# Patient Record
Sex: Female | Born: 1970 | Race: White | Hispanic: No | Smoking: Former smoker
Health system: Southern US, Community
[De-identification: ages and names within clinical notes are randomized; demographics above are authoritative.]

## PROBLEM LIST (undated history)

## (undated) DIAGNOSIS — F1910 Other psychoactive substance abuse, uncomplicated: Secondary | ICD-10-CM

## (undated) DIAGNOSIS — R519 Headache, unspecified: Secondary | ICD-10-CM

## (undated) DIAGNOSIS — T7840XA Allergy, unspecified, initial encounter: Secondary | ICD-10-CM

## (undated) DIAGNOSIS — F32A Depression, unspecified: Secondary | ICD-10-CM

## (undated) DIAGNOSIS — F319 Bipolar disorder, unspecified: Secondary | ICD-10-CM

## (undated) DIAGNOSIS — F209 Schizophrenia, unspecified: Secondary | ICD-10-CM

## (undated) DIAGNOSIS — K219 Gastro-esophageal reflux disease without esophagitis: Secondary | ICD-10-CM

## (undated) DIAGNOSIS — M1990 Unspecified osteoarthritis, unspecified site: Secondary | ICD-10-CM

## (undated) DIAGNOSIS — F329 Major depressive disorder, single episode, unspecified: Secondary | ICD-10-CM

## (undated) DIAGNOSIS — F419 Anxiety disorder, unspecified: Secondary | ICD-10-CM

## (undated) DIAGNOSIS — F609 Personality disorder, unspecified: Secondary | ICD-10-CM

## (undated) DIAGNOSIS — R51 Headache: Secondary | ICD-10-CM

## (undated) HISTORY — PX: OTHER SURGICAL HISTORY: SHX169

## (undated) HISTORY — DX: Gastro-esophageal reflux disease without esophagitis: K21.9

## (undated) HISTORY — PX: KNEE ARTHROSCOPY WITH PATELLA RECONSTRUCTION: SHX5654

## (undated) HISTORY — DX: Allergy, unspecified, initial encounter: T78.40XA

## (undated) HISTORY — DX: Anxiety disorder, unspecified: F41.9

---

## 2008-09-24 ENCOUNTER — Emergency Department: Payer: Self-pay | Admitting: Emergency Medicine

## 2012-10-24 LAB — DRUG SCREEN, URINE
Amphetamines, Ur Screen: NEGATIVE (ref ?–1000)
Barbiturates, Ur Screen: NEGATIVE (ref ?–200)
Benzodiazepine, Ur Scrn: NEGATIVE (ref ?–200)
Cannabinoid 50 Ng, Ur ~~LOC~~: POSITIVE (ref ?–50)
MDMA (Ecstasy)Ur Screen: NEGATIVE (ref ?–500)
Opiate, Ur Screen: NEGATIVE (ref ?–300)
Phencyclidine (PCP) Ur S: NEGATIVE (ref ?–25)
Tricyclic, Ur Screen: NEGATIVE (ref ?–1000)

## 2012-10-24 LAB — URINALYSIS, COMPLETE
Glucose,UR: NEGATIVE mg/dL (ref 0–75)
Nitrite: NEGATIVE
Protein: NEGATIVE
RBC,UR: <1 /HPF (ref 0–5)
Squamous Epithelial: 11
WBC UR: 4 /HPF (ref 0–5)

## 2012-10-24 LAB — CBC
HCT: 39.6 % (ref 35.0–47.0)
HGB: 13.4 g/dL (ref 12.0–16.0)
Platelet: 267 10*3/uL (ref 150–440)
RBC: 4.16 10*6/uL (ref 3.80–5.20)
WBC: 10.2 10*3/uL (ref 3.6–11.0)

## 2012-10-24 LAB — COMPREHENSIVE METABOLIC PANEL
Albumin: 4.1 g/dL (ref 3.4–5.0)
Alkaline Phosphatase: 97 U/L (ref 50–136)
Anion Gap: 7 (ref 7–16)
Bilirubin,Total: 0.4 mg/dL (ref 0.2–1.0)
Calcium, Total: 8.6 mg/dL (ref 8.5–10.1)
Co2: 24 mmol/L (ref 21–32)
EGFR (African American): >60
Glucose: 87 mg/dL (ref 65–99)
Osmolality: 275 (ref 275–301)
SGOT(AST): 20 U/L (ref 15–37)
SGPT (ALT): 17 U/L (ref 12–78)
Total Protein: 7.5 g/dL (ref 6.4–8.2)

## 2012-10-24 LAB — TSH: Thyroid Stimulating Horm: 1.18 u[IU]/mL

## 2012-10-25 ENCOUNTER — Inpatient Hospital Stay: Payer: Self-pay | Admitting: Psychiatry

## 2012-12-02 ENCOUNTER — Emergency Department: Payer: Self-pay | Admitting: Emergency Medicine

## 2013-10-11 ENCOUNTER — Emergency Department: Payer: Self-pay | Admitting: Internal Medicine

## 2014-01-18 ENCOUNTER — Emergency Department: Payer: Self-pay | Admitting: Emergency Medicine

## 2014-02-04 ENCOUNTER — Emergency Department: Payer: Self-pay | Admitting: Emergency Medicine

## 2014-03-07 ENCOUNTER — Emergency Department: Payer: Self-pay | Admitting: Emergency Medicine

## 2014-03-07 LAB — CBC WITH DIFFERENTIAL/PLATELET
BASOS PCT: 1.4 %
Basophil #: 0.1 10*3/uL (ref 0.0–0.1)
Eosinophil #: 0.2 10*3/uL (ref 0.0–0.7)
Eosinophil %: 1.9 %
HCT: 41.6 % (ref 35.0–47.0)
HGB: 13.8 g/dL (ref 12.0–16.0)
LYMPHS ABS: 3.5 10*3/uL (ref 1.0–3.6)
Lymphocyte %: 39.6 %
MCH: 31.3 pg (ref 26.0–34.0)
MCHC: 33.3 g/dL (ref 32.0–36.0)
MCV: 94 fL (ref 80–100)
MONO ABS: 0.5 x10 3/mm (ref 0.2–0.9)
Monocyte %: 5.9 %
NEUTROS PCT: 51.2 %
Neutrophil #: 4.5 10*3/uL (ref 1.4–6.5)
Platelet: 270 10*3/uL (ref 150–440)
RBC: 4.42 10*6/uL (ref 3.80–5.20)
RDW: 14.1 % (ref 11.5–14.5)
WBC: 8.8 10*3/uL (ref 3.6–11.0)

## 2014-03-07 LAB — URINALYSIS, COMPLETE
Bacteria: NONE SEEN
Bilirubin,UR: NEGATIVE
Glucose,UR: NEGATIVE mg/dL (ref 0–75)
Ketone: NEGATIVE
LEUKOCYTE ESTERASE: NEGATIVE
NITRITE: NEGATIVE
PH: 5 (ref 4.5–8.0)
Protein: NEGATIVE
RBC,UR: <1 /HPF (ref 0–5)
SPECIFIC GRAVITY: 1.004 (ref 1.003–1.030)
Squamous Epithelial: 1
WBC UR: <1 /HPF (ref 0–5)

## 2014-03-07 LAB — DRUG SCREEN, URINE
AMPHETAMINES, UR SCREEN: NEGATIVE (ref ?–1000)
BARBITURATES, UR SCREEN: NEGATIVE (ref ?–200)
BENZODIAZEPINE, UR SCRN: NEGATIVE (ref ?–200)
CANNABINOID 50 NG, UR ~~LOC~~: NEGATIVE (ref ?–50)
COCAINE METABOLITE, UR ~~LOC~~: POSITIVE (ref ?–300)
MDMA (ECSTASY) UR SCREEN: NEGATIVE (ref ?–500)
Methadone, Ur Screen: NEGATIVE (ref ?–300)
Opiate, Ur Screen: NEGATIVE (ref ?–300)
Phencyclidine (PCP) Ur S: NEGATIVE (ref ?–25)
Tricyclic, Ur Screen: NEGATIVE (ref ?–1000)

## 2014-03-07 LAB — COMPREHENSIVE METABOLIC PANEL
ALK PHOS: 94 U/L
ALT: 13 U/L (ref 12–78)
ANION GAP: 9 (ref 7–16)
Albumin: 3.7 g/dL (ref 3.4–5.0)
BILIRUBIN TOTAL: 0.2 mg/dL (ref 0.2–1.0)
BUN: 7 mg/dL (ref 7–18)
CHLORIDE: 110 mmol/L — AB (ref 98–107)
Calcium, Total: 8.2 mg/dL — ABNORMAL LOW (ref 8.5–10.1)
Co2: 22 mmol/L (ref 21–32)
Creatinine: 1.04 mg/dL (ref 0.60–1.30)
EGFR (African American): >60
GLUCOSE: 103 mg/dL — AB (ref 65–99)
OSMOLALITY: 279 (ref 275–301)
POTASSIUM: 3.5 mmol/L (ref 3.5–5.1)
SGOT(AST): 21 U/L (ref 15–37)
SODIUM: 141 mmol/L (ref 136–145)
Total Protein: 7.4 g/dL (ref 6.4–8.2)

## 2014-03-07 LAB — ETHANOL
Ethanol %: 0.126 % — ABNORMAL HIGH (ref 0.000–0.080)
Ethanol: 126 mg/dL

## 2014-03-07 LAB — ACETAMINOPHEN LEVEL
Acetaminophen: <2 ug/mL
Acetaminophen: <2 ug/mL

## 2014-03-07 LAB — SALICYLATE LEVEL: Salicylates, Serum: 3 mg/dL — ABNORMAL HIGH

## 2014-03-07 LAB — PREGNANCY, URINE: PREGNANCY TEST, URINE: NEGATIVE m[IU]/mL

## 2014-03-19 ENCOUNTER — Emergency Department: Payer: Self-pay | Admitting: Emergency Medicine

## 2014-06-28 ENCOUNTER — Emergency Department: Payer: Self-pay | Admitting: Emergency Medicine

## 2014-12-26 ENCOUNTER — Emergency Department (HOSPITAL_COMMUNITY): Payer: Medicare Other

## 2014-12-26 ENCOUNTER — Emergency Department (HOSPITAL_COMMUNITY)
Admission: EM | Admit: 2014-12-26 | Discharge: 2014-12-27 | Disposition: A | Discharge status: Home / Self Care | Payer: Medicare Other | Attending: Emergency Medicine | Admitting: Emergency Medicine

## 2014-12-26 ENCOUNTER — Encounter (HOSPITAL_COMMUNITY): Payer: Self-pay

## 2014-12-26 DIAGNOSIS — S0990XA Unspecified injury of head, initial encounter: Secondary | ICD-10-CM | POA: Insufficient documentation

## 2014-12-26 DIAGNOSIS — T148XXA Other injury of unspecified body region, initial encounter: Secondary | ICD-10-CM

## 2014-12-26 DIAGNOSIS — Y92410 Unspecified street and highway as the place of occurrence of the external cause: Secondary | ICD-10-CM | POA: Insufficient documentation

## 2014-12-26 DIAGNOSIS — S24109A Unspecified injury at unspecified level of thoracic spinal cord, initial encounter: Secondary | ICD-10-CM | POA: Insufficient documentation

## 2014-12-26 DIAGNOSIS — S3992XA Unspecified injury of lower back, initial encounter: Secondary | ICD-10-CM | POA: Insufficient documentation

## 2014-12-26 DIAGNOSIS — F419 Anxiety disorder, unspecified: Secondary | ICD-10-CM | POA: Diagnosis not present

## 2014-12-26 DIAGNOSIS — R0602 Shortness of breath: Secondary | ICD-10-CM | POA: Insufficient documentation

## 2014-12-26 DIAGNOSIS — Y9389 Activity, other specified: Secondary | ICD-10-CM | POA: Diagnosis not present

## 2014-12-26 DIAGNOSIS — T148 Other injury of unspecified body region: Secondary | ICD-10-CM | POA: Diagnosis not present

## 2014-12-26 DIAGNOSIS — Y998 Other external cause status: Secondary | ICD-10-CM | POA: Insufficient documentation

## 2014-12-26 DIAGNOSIS — S199XXA Unspecified injury of neck, initial encounter: Secondary | ICD-10-CM | POA: Diagnosis not present

## 2014-12-26 DIAGNOSIS — Z720 Tobacco use: Secondary | ICD-10-CM | POA: Insufficient documentation

## 2014-12-26 DIAGNOSIS — T07XXXA Unspecified multiple injuries, initial encounter: Secondary | ICD-10-CM

## 2014-12-26 MED ORDER — HYDROMORPHONE HCL 1 MG/ML IJ SOLN
1.0000 mg | Freq: Once | INTRAMUSCULAR | Status: AC
  Administered 2014-12-26: 1 mg via INTRAVENOUS
  Filled 2014-12-26: qty 1

## 2014-12-26 MED ORDER — SODIUM CHLORIDE 0.9 % IV SOLN
INTRAVENOUS | Status: DC
  Administered 2014-12-26: 18:00:00 via INTRAVENOUS

## 2014-12-26 MED ORDER — LORAZEPAM 2 MG/ML IJ SOLN
1.0000 mg | Freq: Once | INTRAMUSCULAR | Status: AC
  Administered 2014-12-26: 1 mg via INTRAVENOUS
  Filled 2014-12-26: qty 1

## 2014-12-26 MED ORDER — ONDANSETRON HCL 4 MG/2ML IJ SOLN
4.0000 mg | Freq: Once | INTRAMUSCULAR | Status: AC
  Administered 2014-12-26: 4 mg via INTRAVENOUS
  Filled 2014-12-26: qty 2

## 2014-12-26 MED ORDER — TRAMADOL HCL 50 MG PO TABS: 50.0000 mg | ORAL_TABLET | Freq: Four times a day (QID) | ORAL | Status: DC | PRN
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
## 2015-01-07 NOTE — Discharge Summary (Signed)
PATIENT NAME:  Augustin CoupeRUDD, Reece R MR#:  [id number] DATE OF BIRTH:  07-Sep-1971  DATE OF ADMISSION:  10/25/2012 DATE OF DISCHARGE:  10/27/2012  HOSPITAL COURSE: See dictated history and physical for details of admission. This 44 year old woman came to the Emergency Room reporting suicidal ideation and hallucinations with anxiety and depression. She indicated that she had been feeling out of control, but also that she had relapsed onto cocaine in the last day, which seems to have triggered her hospitalization. In the hospital, she participated appropriately in groups. Did not display any suicidal behavior. Did not appear to be thinking bizarrely. On interview yesterday and today, her affect was calm and reactive. Thoughts appeared to be lucid. Denied auditory or visual hallucinations. Denied suicidal or homicidal ideation. Showed insight and judgment and was agreeable to outpatient treatment. She had been restarted on her medication combination of Prozac, Abilify and Ativan that she had been taking outside the hospital. She was cooperative with treatment planning, and at this point, does not appear to be requiring further hospital level of treatment. She was agreeable to follow up with [facility], where she is already seen.   DISCHARGE MEDICATIONS: Abilify 15 mg p.o. daily, Prozac 40 mg p.o. daily, Synthroid 75 mcg p.o. daily, Ativan 1 mg 3 times a night, which she can take at home as directed by her outpatient clinic.   LABORATORY RESULTS: The CBC was unremarkable. Chemistry panel just showed a slightly low BUN at 6, elevated chloride at 108. Alcohol negative. TSH 1.18. Urinalysis just showed blood. Drug screen positive for cocaine and cannabis. Pregnancy test negative.   MENTAL STATUS EXAM: Neatly dressed and groomed woman who looked her stated age. Cooperative with the interview. Good eye contact, normal psychomotor activity. Speech normal in rate, tone and volume. Affect euthymic, reactive and appropriate. Mood  stated as being okay. Thoughts are lucid without loosening of associations or evidence of delusions. Denies auditory or visual hallucinations. Denies suicidal or homicidal ideation. Improved judgment and insight. Baseline intelligence normal.   DISPOSITION: The patient was counseled about the importance of staying off of abusable drugs because of the effect they have worsening mental health problems. She was agreeable to this and states that she will be following up with [facility].   DIAGNOSIS, PRINCIPAL AND PRIMARY:  AXIS I: Bipolar disorder type II.   SECONDARY DIAGNOSES:  AXIS I:  1. Cocaine abuse.  2. Cannabis abuse.  AXIS II: Deferred.  AXIS III: Hypothyroidism.  AXIS IV: Moderate from ongoing social stress at home and exposure to substance abuse.  AXIS V: Functioning at time of discharge: 60.    ____________________________ Audery AmelJohn T. Kaito Schulenburg, MD jtc:OSi D: 10/27/2012 18:17:50 ET T: 10/28/2012 06:07:50 ET JOB#: [id number]  cc: Audery AmelJohn T. Saxon Crosby, MD, <Dictator> Audery AmelJOHN T Caliana Spires MD ELECTRONICALLY SIGNED 10/28/2012 9:49
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
## 2015-01-08 NOTE — Consult Note (Signed)
PATIENT NAME:  Mercedes Forbes, Mercedes Forbes MR#:  [id number] DATE OF BIRTH:  [birth year]  DATE OF CONSULTATION:  03/07/2014  REFERRING PHYSICIAN:   CONSULTING PHYSICIAN:  Surya K. Guss Bundehalla, MD  PLACE OF DICTATION:  [facility] - BHU [address], [place].  AGE:  44 years.  SEX:  Female.  RACE:  White.  HISTORY OF PRESENT ILLNESS:  The patient was seen in consultation in room #5, BHU, in the [facility] ER. The patient is a 44 year old white female, not employed and is on disability for bipolar disorder, depressed.  The patient lost her husband 6 months ago after being married for 6 months, and he committed suicide and shot himself.  The patient lives with her cat and dog, and her boyfriend visits her occasionally.  The patient has a long history of mental illness and is being followed on an outpatient basis by Dr. Marguerite [facility]Moffett at [facility], and last appointment was a week ago; next appointment is in September 2015.    ALCOHOL AND DRUGS:  Admits that she does not drink alcohol on a regular basis, but she drinks sometimes in binges.  On the day of admission, she was drinking alcohol and got drunk and intoxicated, and boyfriend came to visit and got worried and called ambulance and brought her here.  Denies any other street or prescription drug abuse, does admit smoking nicotine cigarettes at a rate of half a pack a day for many years.    PAST PSYCHIATRIC HISTORY:  History of inpatient psychiatry 4 times so far and last inpatient psychiatry was in July of 2014 at [facility] for [facility].  History of suicide attempt 4 times when she overdosed on pills.    MENTAL STATUS:  The patient is alert and oriented to place, person and time, fully aware of the situation that brought her here.  She realizes that she got depressed and self-medicated herself with alcohol, which she should not.  Denies feeling depressed .  Denies feeling hopeless or helpless.  Denies feeling worthless or useless.  Denies suicidal or homicidal plans and  contracts for safety.  No psychosis.  Denies auditory or visual hallucinations,.  Cognition is grossly intact.  General knowledge is fair.  Insight and judgment guarded.  Impulse control guarded.    IMPRESSION:  Bipolar disorder, depressed, history of.  Currently, the patient reports she is stable. Imp, alcohol induced with intoxication and BAL of 126 at the time she came to the hospital.  Urine drug screen positive for cocaine though the patient denies the same.    RECOMMENDATIONS:  I recommend DC IVC and discharge the patient back home as she contracts for safety and is eager to go back and keep up her followup appointment with Dr. Marguerite OleaMoffett.    ____________________________ Jannet MantisSurya K. Guss Bundehalla, MD skc:cs D: 03/07/2014 19:13:00 ET T: 03/07/2014 19:32:43 ET JOB#: [id number]  cc: Monika SalkSurya K. Guss Bundehalla, MD, <Dictator> Beau FannySURYA K CHALLA MD ELECTRONICALLY SIGNED 03/08/2014 9:28

## 2015-11-24 ENCOUNTER — Encounter: Payer: Self-pay | Admitting: Emergency Medicine

## 2015-11-24 ENCOUNTER — Emergency Department
Admission: EM | Admit: 2015-11-24 | Discharge: 2015-11-24 | Disposition: A | Discharge status: Home / Self Care | Payer: Medicare Other | Attending: Emergency Medicine | Admitting: Emergency Medicine

## 2015-11-24 DIAGNOSIS — R600 Localized edema: Secondary | ICD-10-CM | POA: Diagnosis not present

## 2015-11-24 DIAGNOSIS — Z79899 Other long term (current) drug therapy: Secondary | ICD-10-CM | POA: Diagnosis not present

## 2015-11-24 DIAGNOSIS — R609 Edema, unspecified: Secondary | ICD-10-CM

## 2015-11-24 DIAGNOSIS — F17200 Nicotine dependence, unspecified, uncomplicated: Secondary | ICD-10-CM | POA: Diagnosis not present

## 2015-11-24 DIAGNOSIS — R2230 Localized swelling, mass and lump, unspecified upper limb: Secondary | ICD-10-CM | POA: Diagnosis present

## 2015-11-24 HISTORY — DX: Personality disorder, unspecified: F60.9

## 2015-11-24 HISTORY — DX: Unspecified osteoarthritis, unspecified site: M19.90

## 2015-11-24 HISTORY — DX: Schizophrenia, unspecified: F20.9

## 2015-11-24 HISTORY — DX: Bipolar disorder, unspecified: F31.9

## 2015-11-24 LAB — CBC WITH DIFFERENTIAL/PLATELET
BASOS ABS: 0.1 10*3/uL (ref 0–0.1)
BASOS PCT: 2 %
EOS ABS: 0.1 10*3/uL (ref 0–0.7)
Eosinophils Relative: 2 %
HCT: 34.5 % — ABNORMAL LOW (ref 35.0–47.0)
Hemoglobin: 11.6 g/dL — ABNORMAL LOW (ref 12.0–16.0)
LYMPHS ABS: 1.3 10*3/uL (ref 1.0–3.6)
LYMPHS PCT: 20 %
MCH: 31.1 pg (ref 26.0–34.0)
MCHC: 33.6 g/dL (ref 32.0–36.0)
MCV: 92.6 fL (ref 80.0–100.0)
MONO ABS: 0.4 10*3/uL (ref 0.2–0.9)
MONOS PCT: 6 %
NEUTROS ABS: 4.6 10*3/uL (ref 1.4–6.5)
NEUTROS PCT: 70 %
PLATELETS: 277 10*3/uL (ref 150–440)
RBC: 3.72 MIL/uL — AB (ref 3.80–5.20)
RDW: 13.8 % (ref 11.5–14.5)
WBC: 6.4 10*3/uL (ref 3.6–11.0)

## 2015-11-24 LAB — COMPREHENSIVE METABOLIC PANEL
ALBUMIN: 3.3 g/dL — AB (ref 3.5–5.0)
ALT: 15 U/L (ref 14–54)
ANION GAP: 4 — AB (ref 5–15)
AST: 25 U/L (ref 15–41)
Alkaline Phosphatase: 59 U/L (ref 38–126)
BUN: 9 mg/dL (ref 6–20)
CALCIUM: 8.5 mg/dL — AB (ref 8.9–10.3)
CHLORIDE: 107 mmol/L (ref 101–111)
CO2: 26 mmol/L (ref 22–32)
Creatinine, Ser: 1.01 mg/dL — ABNORMAL HIGH (ref 0.44–1.00)
GFR calc non Af Amer: >60 mL/min (ref >60)
GLUCOSE: 95 mg/dL (ref 65–99)
POTASSIUM: 4.3 mmol/L (ref 3.5–5.1)
SODIUM: 137 mmol/L (ref 135–145)
Total Bilirubin: 0.5 mg/dL (ref 0.3–1.2)
Total Protein: 6.3 g/dL — ABNORMAL LOW (ref 6.5–8.1)

## 2015-11-24 LAB — CK: CK TOTAL: 291 U/L — AB (ref 38–234)

## 2015-11-24 LAB — SEDIMENTATION RATE: Sed Rate: 23 mm/hr — ABNORMAL HIGH (ref 0–20)

## 2015-11-24 MED ORDER — HYDROCHLOROTHIAZIDE 12.5 MG PO TABS: 12.5000 mg | ORAL_TABLET | Freq: Every day | ORAL | Status: DC

## 2015-11-24 NOTE — ED Notes (Signed)
Hands, knees and ankles pain and feels swollen x 3 days

## 2015-11-24 NOTE — Discharge Instructions (Signed)

## 2015-11-24 NOTE — ED Provider Notes (Signed)
[facility] Emergency Department Provider Note  ____________________________________________  Time seen: Approximately 12:29 PM  I have reviewed the triage vital signs and the nursing notes.   HISTORY  Chief Complaint Joint Swelling    HPI Mercedes Forbes is a 45 y.o. female , NAD, presents to the emergency department with swelling about the hands, ankles, feet for about 3 days, causing pain. Does have history of degenerative arthritis but no history of rheumatoid or other forms of arthritis. Has not had any falls, traumas or other injuries. Denies chest pain, shortness of breath, numbness, weakness, tingling. Denies any redness, warmth, skin sores. No recent changes in medication. Was taken off Latuda and put on Abilify in November 2016. No other changes made to her medical regimen since that time. She does have a psychiatrist but does not have primary care at this time.   Past Medical History  Diagnosis Date  . Arthritis   . Bipolar 1 disorder (HCC)   . Schizophrenia (HCC)   . Personality disorder     There are no active problems to display for this patient.   Past Surgical History  Procedure Laterality Date  . Joint replacement      Current Outpatient Rx  Name  Route  Sig  Dispense  Refill  . clonazePAM (KLONOPIN) 1 MG tablet   Oral   Take 1 mg by mouth 2 (two) times daily.      3   . cloNIDine (CATAPRES) 0.1 MG tablet   Oral   Take 0.1 mg by mouth at bedtime.      3   . FLUoxetine (PROZAC) 40 MG capsule   Oral   Take 40 mg by mouth daily.      3   . hydrochlorothiazide (HYDRODIURIL) 12.5 MG tablet   Oral   Take 1 tablet (12.5 mg total) by mouth daily.   14 tablet   0   . LATUDA 80 MG TABS tablet   Oral   Take 80 mg by mouth daily.      3     Dispense as written.   . Multiple Vitamins-Minerals (MULTIVITAMIN ADULT PO)   Oral   Take 1 tablet by mouth daily.         . traMADol (ULTRAM) 50 MG tablet   Oral   Take 1  tablet (50 mg total) by mouth every 6 (six) hours as needed.   20 tablet   0   . zolpidem (AMBIEN) 10 MG tablet   Oral   Take 10 mg by mouth at bedtime.      3     Allergies Aspirin  History reviewed. No pertinent family history.  Social History Social History  Substance Use Topics  . Smoking status: Current Every Day Smoker -- 0.50 packs/day  . Smokeless tobacco: None  . Alcohol Use: No     Review of Systems  Constitutional: No fever/chills, fatigue Eyes: No visual changes.  Cardiovascular: No chest pain, palpitations. Respiratory: No cough. No shortness of breath. No wheezing.  Gastrointestinal: No abdominal pain.  No nausea, vomiting.  No diarrhea.  No constipation. Musculoskeletal: Negative for back pain.  Skin: Swelling and edema to hands, ankles, feet. Negative for rash, redness, warmth, skin sores. Neurological: Negative for headaches, focal weakness or numbness. 10-point ROS otherwise negative.  ____________________________________________   PHYSICAL EXAM:  VITAL SIGNS: ED Triage Vitals  Enc Vitals Group     BP 11/24/15 1209 107/76 mmHg     Pulse Rate  11/24/15 1209 81     Resp 11/24/15 1209 18     Temp 11/24/15 1209 97.5 F (36.4 C)     Temp Source 11/24/15 1209 Oral     SpO2 11/24/15 1209 99 %     Weight 11/24/15 1209 180 lb (81.647 kg)     Height 11/24/15 1209  (1.727 m)     Head Cir --      Peak Flow --      Pain Score 11/24/15 1210 8     Pain Loc --      Pain Edu? --      Excl. in GC? --     Constitutional: Alert and oriented. Well appearing and in no acute distress. Eyes: Conjunctivae are normal.  Head: Atraumatic. Neck: No stridor. Supple with full range of motion Hematological/Lymphatic/Immunilogical: No cervical lymphadenopathy. Cardiovascular: Normal rate, regular rhythm. Normal S1 and S2.  Good peripheral circulation with bilateral upper and lower extremities with 2+ pulses. 1-2+ pitting edema noted in bilateral lower  extremities. Respiratory: Normal respiratory effort without tachypnea or retractions. Lungs CTAB. Gastrointestinal: Soft and nontender. No distention. No CVA tenderness. Musculoskeletal: No upper nor lower extremity tenderness to palpation.  Patient notes pressure with forming a fist and if she lowers her feet to the floor. No pain in feet/ankles if laid flat or elevated. FROM of all joints in hands, ankles, feet. No joint effusions. Neurologic:  Normal speech and language. No gross focal neurologic deficits are appreciated.  Skin:  Skin is warm, dry and intact. No rash noted. Psychiatric: Mood and affect are normal. Speech and behavior are normal. Patient exhibits appropriate insight and judgement.   ____________________________________________   LABS (all labs ordered are listed, but only abnormal results are displayed)  Labs Reviewed  COMPREHENSIVE METABOLIC PANEL - Abnormal; Notable for the following:    Creatinine, Ser 1.01 (*)    Calcium 8.5 (*)    Total Protein 6.3 (*)    Albumin 3.3 (*)    Anion gap 4 (*)    All other components within normal limits  CBC WITH DIFFERENTIAL/PLATELET - Abnormal; Notable for the following:    RBC 3.72 (*)    Hemoglobin 11.6 (*)    HCT 34.5 (*)    All other components within normal limits  CK - Abnormal; Notable for the following:    Total CK 291 (*)    All other components within normal limits  SEDIMENTATION RATE - Abnormal; Notable for the following:    Sed Rate 23 (*)    All other components within normal limits   ____________________________________________  EKG  None ____________________________________________  RADIOLOGY  None   ____________________________________________    PROCEDURES  Procedure(s) performed: None    Medications - No data to display   ____________________________________________   INITIAL IMPRESSION / ASSESSMENT AND PLAN / ED COURSE  Pertinent lab results that were available during my care of  the patient were reviewed by me and considered in my medical decision making (see chart for details). Lab values without significant abnormalities to point int he direction of life threatening illness or injury.   Patient's diagnosis is consistent with peripheral edema that may be caused by Abilify. According to drug safety information in Up to Date, 1-10% of patients on Abilify experience peripheral edema. Patient will be discharged home with prescriptions for hydrochlorothiazide 12.5 mg tablets take 1 tablet by mouth daily. Patient was advised of potential hypotension by taking this medication and increases in urination habits. Patient voiced  understanding as well as the family member at the bedside. [facility] to establish for primary care and for recheck of this visit. Patient is given ED precautions to return to the ED for any worsening or new symptoms.    ____________________________________________  FINAL CLINICAL IMPRESSION(S) / ED DIAGNOSES  Final diagnoses:  Peripheral edema      NEW MEDICATIONS STARTED DURING THIS VISIT:  New Prescriptions   HYDROCHLOROTHIAZIDE (HYDRODIURIL) 12.5 MG TABLET    Take 1 tablet (12.5 mg total) by mouth daily.         Hope PigeonJami L Hagler, PA-C 11/24/15 1407  Governor Rooksebecca Lord, MD 11/24/15 1535

## 2016-01-30 ENCOUNTER — Encounter: Payer: Self-pay | Admitting: *Deleted

## 2016-01-30 ENCOUNTER — Emergency Department
Admission: EM | Admit: 2016-01-30 | Discharge: 2016-01-30 | Disposition: A | Discharge status: Home / Self Care | Payer: Medicare Other | Attending: Emergency Medicine | Admitting: Emergency Medicine

## 2016-01-30 DIAGNOSIS — M1990 Unspecified osteoarthritis, unspecified site: Secondary | ICD-10-CM | POA: Diagnosis not present

## 2016-01-30 DIAGNOSIS — B373 Candidiasis of vulva and vagina: Secondary | ICD-10-CM

## 2016-01-30 DIAGNOSIS — H9203 Otalgia, bilateral: Secondary | ICD-10-CM

## 2016-01-30 DIAGNOSIS — B3731 Acute candidiasis of vulva and vagina: Secondary | ICD-10-CM

## 2016-01-30 DIAGNOSIS — F17200 Nicotine dependence, unspecified, uncomplicated: Secondary | ICD-10-CM | POA: Diagnosis not present

## 2016-01-30 DIAGNOSIS — F319 Bipolar disorder, unspecified: Secondary | ICD-10-CM | POA: Insufficient documentation

## 2016-01-30 DIAGNOSIS — F209 Schizophrenia, unspecified: Secondary | ICD-10-CM | POA: Insufficient documentation

## 2016-01-30 DIAGNOSIS — N760 Acute vaginitis: Secondary | ICD-10-CM

## 2016-01-30 DIAGNOSIS — Z79899 Other long term (current) drug therapy: Secondary | ICD-10-CM | POA: Diagnosis not present

## 2016-01-30 LAB — WET PREP, GENITAL
CLUE CELLS WET PREP: NONE SEEN
SPERM: NONE SEEN
Trich, Wet Prep: NONE SEEN

## 2016-01-30 LAB — CHLAMYDIA/NGC RT PCR (ARMC ONLY)
CHLAMYDIA TR: NOT DETECTED
N gonorrhoeae: NOT DETECTED

## 2016-01-30 MED ORDER — FLUCONAZOLE 150 MG PO TABS: 150.0000 mg | ORAL_TABLET | Freq: Once | ORAL | Status: DC

## 2016-01-30 NOTE — Discharge Instructions (Signed)
Vaginitis Vaginitis is an inflammation of the vagina. It is most often caused by a change in the normal balance of the bacteria and yeast that live in the vagina. This change in balance causes an overgrowth of certain bacteria or yeast, which causes the inflammation. There are different types of vaginitis, but the most common types are:  Bacterial vaginosis.  Yeast infection (candidiasis).  Trichomoniasis vaginitis. This is a sexually transmitted infection (STI).  Viral vaginitis.  Atrophic vaginitis.  Allergic vaginitis. CAUSES  The cause depends on the type of vaginitis. Vaginitis can be caused by:  Bacteria (bacterial vaginosis).  Yeast (yeast infection).  A parasite (trichomoniasis vaginitis)  A virus (viral vaginitis).  Low hormone levels (atrophic vaginitis). Low hormone levels can occur during pregnancy, breastfeeding, or after menopause.  Irritants, such as bubble baths, scented tampons, and feminine sprays (allergic vaginitis). Other factors can change the normal balance of the yeast and bacteria that live in the vagina. These include:  Antibiotic medicines.  Poor hygiene.  Diaphragms, vaginal sponges, spermicides, birth control pills, and intrauterine devices (IUD).  Sexual intercourse.  Infection.  Uncontrolled diabetes.  A weakened immune system. SYMPTOMS  Symptoms can vary depending on the cause of the vaginitis. Common symptoms include:  Abnormal vaginal discharge.  The discharge is white, gray, or yellow with bacterial vaginosis.  The discharge is thick, white, and cheesy with a yeast infection.  The discharge is frothy and yellow or greenish with trichomoniasis.  A bad vaginal odor.  The odor is fishy with bacterial vaginosis.  Vaginal itching, pain, or swelling.  Painful intercourse.  Pain or burning when urinating. Sometimes, there are no symptoms. TREATMENT  Treatment will vary depending on the type of infection.   Bacterial  vaginosis and trichomoniasis are often treated with antibiotic creams or pills.  Yeast infections are often treated with antifungal medicines, such as vaginal creams or suppositories.  Viral vaginitis has no cure, but symptoms can be treated with medicines that relieve discomfort. Your sexual partner should be treated as well.  Atrophic vaginitis may be treated with an estrogen cream, pill, suppository, or vaginal ring. If vaginal dryness occurs, lubricants and moisturizing creams may help. You may be told to avoid scented soaps, sprays, or douches.  Allergic vaginitis treatment involves quitting the use of the product that is causing the problem. Vaginal creams can be used to treat the symptoms. HOME CARE INSTRUCTIONS   Take all medicines as directed by your caregiver.  Keep your genital area clean and dry. Avoid soap and only rinse the area with water.  Avoid douching. It can remove the healthy bacteria in the vagina.  Do not use tampons or have sexual intercourse until your vaginitis has been treated. Use sanitary pads while you have vaginitis.  Wipe from front to back. This avoids the spread of bacteria from the rectum to the vagina.  Let air reach your genital area.  Wear cotton underwear to decrease moisture buildup.  Avoid wearing underwear while you sleep until your vaginitis is gone.  Avoid tight pants and underwear or nylons without a cotton panel.  Take off wet clothing (especially bathing suits) as soon as possible.  Use mild, non-scented products. Avoid using irritants, such as:  Scented feminine sprays.  Fabric softeners.  Scented detergents.  Scented tampons.  Scented soaps or bubble baths.  Practice safe sex and use condoms. Condoms may prevent the spread of trichomoniasis and viral vaginitis. SEEK MEDICAL CARE IF:   You have abdominal pain.  You  have a fever or persistent symptoms for more than 2-3 days.  You have a fever and your symptoms suddenly  get worse.   This information is not intended to replace advice given to you by your health care provider. Make sure you discuss any questions you have with your health care provider.   Document Released: 07/01/2007 Document Revised: 01/18/2015 Document Reviewed: 02/14/2012 Elsevier Interactive Patient Education 2016 [employer]  You have yeast vaginitis. Take the prescription pill as directed. Folow-up with the Health Department as needed.

## 2016-01-30 NOTE — ED Provider Notes (Signed)
[facility] Emergency Department Provider Note ____________________________________________  Time seen: 1742  I have reviewed the triage vital signs and the nursing notes.  HISTORY  Chief Complaint  Foreign Body in Ear and Vaginal Itching  HPI Mercedes Forbes is a 45 y.o. female presents to the ED for evaluation of bilateral otalgia and possible retained cotton swab in the canal. She also noted vaginal irritation and itching. She denies fevers, chills, sweats, or abnormal vaginal bleeding.   Past Medical History  Diagnosis Date  . Arthritis   . Bipolar 1 disorder (HCC)   . Schizophrenia (HCC)   . Personality disorder     There are no active problems to display for this patient.   Past Surgical History  Procedure Laterality Date  . Joint replacement      Current Outpatient Rx  Name  Route  Sig  Dispense  Refill  . clonazePAM (KLONOPIN) 1 MG tablet   Oral   Take 1 mg by mouth 2 (two) times daily.      3   . cloNIDine (CATAPRES) 0.1 MG tablet   Oral   Take 0.1 mg by mouth at bedtime.      3   . fluconazole (DIFLUCAN) 150 MG tablet   Oral   Take 1 tablet (150 mg total) by mouth once.   1 tablet   1   . FLUoxetine (PROZAC) 40 MG capsule   Oral   Take 40 mg by mouth daily.      3   . hydrochlorothiazide (HYDRODIURIL) 12.5 MG tablet   Oral   Take 1 tablet (12.5 mg total) by mouth daily.   14 tablet   0   . LATUDA 80 MG TABS tablet   Oral   Take 80 mg by mouth daily.      3     Dispense as written.   . Multiple Vitamins-Minerals (MULTIVITAMIN ADULT PO)   Oral   Take 1 tablet by mouth daily.         . traMADol (ULTRAM) 50 MG tablet   Oral   Take 1 tablet (50 mg total) by mouth every 6 (six) hours as needed.   20 tablet   0   . zolpidem (AMBIEN) 10 MG tablet   Oral   Take 10 mg by mouth at bedtime.      3    Allergies Aspirin  No family history on file.  Social History Social History  Substance Use Topics   . Smoking status: Current Every Day Smoker -- 0.50 packs/day  . Smokeless tobacco: None  . Alcohol Use: No    Review of Systems  Constitutional: Negative for fever. Eyes: Negative for visual changes. ENT: Negative for sore throat. Otalgia as above Gastrointestinal: Negative for abdominal pain, vomiting and diarrhea. Genitourinary: Negative for dysuria. Vaginal irritation as above.  Musculoskeletal: Negative for back pain. Skin: Negative for rash. ____________________________________________  PHYSICAL EXAM:  VITAL SIGNS: ED Triage Vitals  Enc Vitals Group     BP 01/30/16 1658 100/60 mmHg     Pulse Rate 01/30/16 1658 80     Resp 01/30/16 1658 20     Temp 01/30/16 1658 98.7 F (37.1 C)     Temp Source 01/30/16 1658 Oral     SpO2 01/30/16 1658 95 %     Weight 01/30/16 1658 160 lb (72.576 kg)     Height 01/30/16 1658  (1.727 m)     Head Cir --  Peak Flow --      Pain Score --      Pain Loc --      Pain Edu? --      Excl. in GC? --    Constitutional: Alert and oriented. Well appearing and in no distress. Head: Normocephalic and atraumatic.      Eyes: Conjunctivae are normal. PERRL. Normal extraocular movements      Ears: Canals clear. TMs intact bilaterally. No gross foreign bodies noted.    Nose: No congestion/rhinorrhea.   Mouth/Throat: Mucous membranes are moist.   Neck: Supple. No thyromegaly. Hematological/Lymphatic/Immunological: No cervical lymphadenopathy. Cardiovascular: Normal rate, regular rhythm.  Respiratory: Normal respiratory effort. No wheezes/rales/rhonchi. Gastrointestinal: Soft and nontender. No distention. GU: normal external genitalia. Moderate thick, greenish discharge in the vagina. No CMT, adnexal masses. Skin:  Skin is warm, dry and intact. No rash noted. ____________________________________________    LABS (pertinent positives/negatives) Labs Reviewed  WET PREP, GENITAL - Abnormal; Notable for the following:    Yeast Wet  Prep HPF POC PRESENT (*)    WBC, Wet Prep HPF POC TOO NUMEROUS TO COUNT (*)    All other components within normal limits  CHLAMYDIA/NGC RT PCR ([facility] ONLY)  ____________________________________________  INITIAL IMPRESSION / ASSESSMENT AND PLAN / ED COURSE  Patient with relief of ear pain following ear wash bilaterally. Treatment for yeast vaginitis with Diflucan. Follow-up with the [facility].  ____________________________________________  FINAL CLINICAL IMPRESSION(S) / ED DIAGNOSES  Final diagnoses:  Otalgia of both ears  Vaginitis  Yeast vaginitis      Lissa HoardJenise V Bacon Jensyn Cambria, PA-C 01/30/16 2110  Rockne MenghiniAnne-Caroline Norman, MD 01/30/16 2326

## 2016-01-30 NOTE — ED Notes (Signed)
States she fells like her ears are clogged up ..having pressure   Also thinks she has a bacterial infection

## 2016-01-30 NOTE — ED Notes (Signed)
Pt has a piece of q-tip stuck in ear, pt complains of vaginal itching

## 2016-04-02 ENCOUNTER — Encounter: Payer: Self-pay | Admitting: Emergency Medicine

## 2016-04-02 ENCOUNTER — Emergency Department
Admission: EM | Admit: 2016-04-02 | Discharge: 2016-04-03 | Disposition: A | Discharge status: Other Acute Inpatient Hospital | Payer: Medicare Other | Attending: Emergency Medicine | Admitting: Emergency Medicine

## 2016-04-02 DIAGNOSIS — F17200 Nicotine dependence, unspecified, uncomplicated: Secondary | ICD-10-CM | POA: Diagnosis not present

## 2016-04-02 DIAGNOSIS — M1990 Unspecified osteoarthritis, unspecified site: Secondary | ICD-10-CM | POA: Diagnosis not present

## 2016-04-02 DIAGNOSIS — F1420 Cocaine dependence, uncomplicated: Secondary | ICD-10-CM

## 2016-04-02 DIAGNOSIS — F209 Schizophrenia, unspecified: Secondary | ICD-10-CM | POA: Diagnosis not present

## 2016-04-02 DIAGNOSIS — F319 Bipolar disorder, unspecified: Secondary | ICD-10-CM | POA: Insufficient documentation

## 2016-04-02 DIAGNOSIS — F3181 Bipolar II disorder: Secondary | ICD-10-CM

## 2016-04-02 DIAGNOSIS — R45851 Suicidal ideations: Secondary | ICD-10-CM | POA: Diagnosis present

## 2016-04-02 DIAGNOSIS — Z79899 Other long term (current) drug therapy: Secondary | ICD-10-CM | POA: Diagnosis not present

## 2016-04-02 DIAGNOSIS — F332 Major depressive disorder, recurrent severe without psychotic features: Secondary | ICD-10-CM | POA: Diagnosis not present

## 2016-04-02 DIAGNOSIS — F1220 Cannabis dependence, uncomplicated: Secondary | ICD-10-CM

## 2016-04-02 LAB — COMPREHENSIVE METABOLIC PANEL
ALK PHOS: 148 U/L — AB (ref 38–126)
ALT: 88 U/L — AB (ref 14–54)
AST: 81 U/L — AB (ref 15–41)
Albumin: 4.1 g/dL (ref 3.5–5.0)
Anion gap: 7 (ref 5–15)
BUN: 12 mg/dL (ref 6–20)
CALCIUM: 9.4 mg/dL (ref 8.9–10.3)
CHLORIDE: 101 mmol/L (ref 101–111)
CO2: 26 mmol/L (ref 22–32)
CREATININE: 0.89 mg/dL (ref 0.44–1.00)
Glucose, Bld: 97 mg/dL (ref 65–99)
Potassium: 4.1 mmol/L (ref 3.5–5.1)
SODIUM: 134 mmol/L — AB (ref 135–145)
Total Bilirubin: 0.5 mg/dL (ref 0.3–1.2)
Total Protein: 7.8 g/dL (ref 6.5–8.1)

## 2016-04-02 LAB — SALICYLATE LEVEL

## 2016-04-02 LAB — CBC
HCT: 42.4 % (ref 35.0–47.0)
HEMOGLOBIN: 14.8 g/dL (ref 12.0–16.0)
MCH: 31.2 pg (ref 26.0–34.0)
MCHC: 34.8 g/dL (ref 32.0–36.0)
MCV: 89.6 fL (ref 80.0–100.0)
Platelets: 293 10*3/uL (ref 150–440)
RBC: 4.73 MIL/uL (ref 3.80–5.20)
RDW: 15 % — ABNORMAL HIGH (ref 11.5–14.5)
WBC: 10.3 10*3/uL (ref 3.6–11.0)

## 2016-04-02 LAB — ETHANOL: Alcohol, Ethyl (B): <5 mg/dL (ref <5)

## 2016-04-02 LAB — POCT PREGNANCY, URINE: Preg Test, Ur: NEGATIVE

## 2016-04-02 LAB — URINE DRUG SCREEN, QUALITATIVE (ARMC ONLY)
Amphetamines, Ur Screen: NOT DETECTED
BARBITURATES, UR SCREEN: NOT DETECTED
Benzodiazepine, Ur Scrn: NOT DETECTED
CANNABINOID 50 NG, UR ~~LOC~~: POSITIVE — AB
COCAINE METABOLITE, UR ~~LOC~~: POSITIVE — AB
MDMA (ECSTASY) UR SCREEN: NOT DETECTED
Methadone Scn, Ur: NOT DETECTED
OPIATE, UR SCREEN: NOT DETECTED
PHENCYCLIDINE (PCP) UR S: NOT DETECTED
TRICYCLIC, UR SCREEN: NOT DETECTED

## 2016-04-02 LAB — ACETAMINOPHEN LEVEL: Acetaminophen (Tylenol), Serum: <10 ug/mL — ABNORMAL LOW (ref 10–30)

## 2016-04-02 MED ORDER — ZOLPIDEM TARTRATE 5 MG PO TABS
5.0000 mg | ORAL_TABLET | Freq: Every day | ORAL | Status: DC
  Administered 2016-04-02: 5 mg via ORAL
  Filled 2016-04-02: qty 1

## 2016-04-02 MED ORDER — CLONAZEPAM 0.5 MG PO TABS: 0.5000 mg | ORAL_TABLET | ORAL | Status: DC

## 2016-04-02 MED ORDER — CLONIDINE HCL 0.1 MG PO TABS
0.1000 mg | ORAL_TABLET | Freq: Two times a day (BID) | ORAL | Status: DC
Start: 2016-04-02 — End: 2016-04-03
  Administered 2016-04-02: 0.1 mg via ORAL
  Filled 2016-04-02: qty 1

## 2016-04-02 MED ORDER — FLUOXETINE HCL 20 MG PO CAPS
40.0000 mg | ORAL_CAPSULE | Freq: Every day | ORAL | Status: DC
  Administered 2016-04-02 – 2016-04-03 (×2): 40 mg via ORAL
  Filled 2016-04-02 (×2): qty 2

## 2016-04-02 MED ORDER — NICOTINE 14 MG/24HR TD PT24
14.0000 mg | MEDICATED_PATCH | Freq: Once | TRANSDERMAL | Status: DC
  Administered 2016-04-02: 14 mg via TRANSDERMAL
  Filled 2016-04-02: qty 1

## 2016-04-02 MED ORDER — ARIPIPRAZOLE 10 MG PO TABS
20.0000 mg | ORAL_TABLET | Freq: Every day | ORAL | Status: DC
  Administered 2016-04-02 – 2016-04-03 (×2): 20 mg via ORAL
  Filled 2016-04-02 (×2): qty 2

## 2016-04-02 NOTE — ED Notes (Signed)
PT  PUT  UNDER  IVC   INFORMED  NURSE  ANNA

## 2016-04-02 NOTE — ED Notes (Signed)
BEHAVIORAL HEALTH ROUNDING Patient sleeping: No. Patient alert and oriented: yes Behavior appropriate: Yes.  ; If no, describe:  Nutrition and fluids offered: Yes  Toileting and hygiene offered: Yes  Sitter present: q 15 min checks Law enforcement present: Yes  

## 2016-04-02 NOTE — ED Notes (Signed)

## 2016-04-02 NOTE — ED Notes (Signed)
[facility].  No complaints from patient with move.

## 2016-04-02 NOTE — ED Provider Notes (Signed)
[facility] Emergency Department Provider Note  ____________________________________________  Time seen: Approximately 4:09 PM  I have reviewed the triage vital signs and the nursing notes.   HISTORY  Chief Complaint Suicidal    HPI Mercedes Forbes is a 45 y.o. female history of bipolar disorder, borderline personality, schizophrenia.  Patient reports she lives with friends. Over the last couple of months she's had increasingly obtrusive thoughts of committing suicide. She reports that she's been taking her medications, but she occasionally hears voices that clearly instruct her to want to harm herself. She is coming to the point that she is thinking of hanging herself.  She denies any other recent medical issues. She reports that she's been in good health but does have chronic neck pain due to osteoarthritis. She denies any recent fevers chills.  She has been seen by psychiatry reports she was seen here at some point last year, and normally follows with Dr. Marguerite Olea at [facility].   Past Medical History  Diagnosis Date  . Arthritis   . Bipolar 1 disorder (HCC)   . Schizophrenia (HCC)   . Personality disorder     There are no active problems to display for this patient.   Past Surgical History  Procedure Laterality Date  . Joint replacement      Current Outpatient Rx  Name  Route  Sig  Dispense  Refill  . clonazePAM (KLONOPIN) 1 MG tablet   Oral   Take 1 mg by mouth 2 (two) times daily.      3   . cloNIDine (CATAPRES) 0.1 MG tablet   Oral   Take 0.1 mg by mouth at bedtime.      3   . fluconazole (DIFLUCAN) 150 MG tablet   Oral   Take 1 tablet (150 mg total) by mouth once.   1 tablet   1   . FLUoxetine (PROZAC) 40 MG capsule   Oral   Take 40 mg by mouth daily.      3   . hydrochlorothiazide (HYDRODIURIL) 12.5 MG tablet   Oral   Take 1 tablet (12.5 mg total) by mouth daily.   14 tablet   0   . LATUDA 80 MG TABS tablet   Oral  Take 80 mg by mouth daily.      3     Dispense as written.   . Multiple Vitamins-Minerals (MULTIVITAMIN ADULT PO)   Oral   Take 1 tablet by mouth daily.         . traMADol (ULTRAM) 50 MG tablet   Oral   Take 1 tablet (50 mg total) by mouth every 6 (six) hours as needed.   20 tablet   0   . zolpidem (AMBIEN) 10 MG tablet   Oral   Take 10 mg by mouth at bedtime.      3     Allergies Aspirin  No family history on file.  Social History Social History  Substance Use Topics  . Smoking status: Current Every Day Smoker -- 0.50 packs/day  . Smokeless tobacco: None  . Alcohol Use: No    Review of Systems Constitutional: No fever/chills Eyes: No visual changes. ENT: No sore throat. Cardiovascular: Denies chest pain. Respiratory: Denies shortness of breath. Gastrointestinal: No abdominal pain.  No nausea, no vomiting.  No diarrhea.  No constipation. Genitourinary: Negative for dysuria. Musculoskeletal: Negative for back pain. Skin: Negative for rash. Neurological: Negative for headaches, focal weakness or numbness.  Reports suicidal ideation.  10-point ROS otherwise negative.  ____________________________________________   PHYSICAL EXAM:  VITAL SIGNS: ED Triage Vitals  Enc Vitals Group     BP 04/02/16 1547 111/70 mmHg     Pulse Rate 04/02/16 1547 70     Resp 04/02/16 1547 16     Temp 04/02/16 1547 97.7 F (36.5 C)     Temp Source 04/02/16 1547 Oral     SpO2 04/02/16 1547 97 %     Weight 04/02/16 1547 155 lb (70.308 kg)     Height 04/02/16 1547  (1.727 m)     Head Cir --      Peak Flow --      Pain Score 04/02/16 1547 8     Pain Loc --      Pain Edu? --      Excl. in GC? --    Constitutional: Alert and oriented. Well appearing and in no acute distress.Eating peanut butter and crackers. Very pleasant and in no distress. Eyes: Conjunctivae are normal. PERRL. EOMI. Head: Atraumatic. Nose: No congestion/rhinnorhea. Mouth/Throat: Mucous membranes  are moist.  Oropharynx non-erythematous. Neck: No stridor.   Cardiovascular: Normal rate, regular rhythm. Grossly normal heart sounds.  Good peripheral circulation. Respiratory: Normal respiratory effort.  No retractions. Lungs CTAB. Gastrointestinal: Soft and nontender. No distention.  Musculoskeletal: No lower extremity tenderness nor edema.  No joint effusions. Neurologic:  Normal speech and language. No gross focal neurologic deficits are appreciated.Skin:  Skin is warm, dry and intact. No rash noted. Psychiatric: Mood and affect are slightly flat. Speech and behavior are normal.  ____________________________________________   LABS (all labs ordered are listed, but only abnormal results are displayed)  Labs Reviewed  COMPREHENSIVE METABOLIC PANEL - Abnormal; Notable for the following:    Sodium 134 (*)    AST 81 (*)    ALT 88 (*)    Alkaline Phosphatase 148 (*)    All other components within normal limits  ACETAMINOPHEN LEVEL - Abnormal; Notable for the following:    Acetaminophen (Tylenol), Serum <10 (*)    All other components within normal limits  CBC - Abnormal; Notable for the following:    RDW 15.0 (*)    All other components within normal limits  URINE DRUG SCREEN, QUALITATIVE ([facility] ONLY) - Abnormal; Notable for the following:    Cocaine Metabolite,Ur [place] POSITIVE (*)    Cannabinoid 50 Ng, Ur [place] POSITIVE (*)    All other components within normal limits  ETHANOL  SALICYLATE LEVEL  POC URINE PREG, ED  POCT PREGNANCY, URINE   ____________________________________________  EKG   ____________________________________________  RADIOLOGY   ____________________________________________   PROCEDURES  Procedure(s) performed: None  Critical Care performed: No  ____________________________________________   INITIAL IMPRESSION / ASSESSMENT AND PLAN / ED COURSE  Pertinent labs & imaging results that were available during my care of the patient were reviewed by me  and considered in my medical decision making (see chart for details).  Patient denies acute medical condition. She has no abdominal pain. She does have slightly elevated transaminases, however she also reports that she does drink alcohol. Until about a week ago. In addition, she denies any overdose on any sort of acetaminophen-type compounds. She is not having abdominal pain or fever. I suspect this is likely due to her history of some alcohol abuse and her acetaminophen level today is negative without report of any overdose.  Case and care discussed with Dr. Toni Amend, he would like to continue her on involuntary commitment for the  evening and plans to reassess the patient tomorrow. ____________________________________________   FINAL CLINICAL IMPRESSION(S) / ED DIAGNOSES  Final diagnoses:  Schizophrenia, unspecified type (HCC)      Sharyn CreamerMark Jenette Rayson, MD 04/02/16 1720

## 2016-04-02 NOTE — ED Notes (Signed)

## 2016-04-02 NOTE — ED Notes (Signed)
Pt. Alert and oriented, warm and dry, in no distress. Pt. Denies SI, HI, and AVH. Pt. Encouraged to let nursing staff know of any concerns or needs. 

## 2016-04-02 NOTE — ED Notes (Signed)
Patient reports depression since her husband killed himself 2 years ago.  Patient reports attempting to kill herself by overdose about 1 year ago and reports a plan to kill herself today by hanging herself with a [id number]-[id number] belt.  Patient is tearful and states, "my family is mad at me because I started using cocaine again."

## 2016-04-02 NOTE — ED Notes (Signed)
Dr. Fanny BienQuale discontinuing sitter at this time.  Loney LaurenceSitter, Tracey, EDT notified.  Patient receiving q 15 min. Checks by Elita QuickPam, EDT.  Patient's door is open with good visualization.

## 2016-04-02 NOTE — Consult Note (Signed)
[facility]iat[facility] Consult   Reason for Consult:  Consult for 45 year old woman with a history of recurrent severe depression who presented to the emergency room saying she was suicidal Referring Physician:  Quale Patient Identification: Mercedes Forbes MRN:  [id number] Principal Diagnosis: Severe recurrent major depression without psychotic features [facility]) Diagnosis:   Patient Active Problem List   Diagnosis Date Noted  . Severe recurrent major depression without psychotic features ([place]) [F33.2] 04/02/2016  . Cocaine abuse [F14.10] 04/02/2016  . Marijuana abuse [F12.10] 04/02/2016    Total Time spent with patient: 1 hour  Subjective:   Mercedes Forbes is a 45 y.o. female patient admitted with "I miss my husband".  HPI:  Patient interviewed. Chart reviewed. This a 45 year old woman with a history of recurrent episodes depression as well as substance abuse who had her self brought to the emergency room because of depression. She tells me that she's been feeling increasingly depressed for the past month with an especially bad time this past week. She feels sad and down all the time. She says that she stays in bed and sleeps all the time and has no energy. Appetite is been normal. She denies having any hallucinations. She says she feels worthless and helpless and has been having thoughts of hanging herself. She claims that she has been compliant with her prescribed psychiatric medicine. She admits that she smokes cocaine several times a week and uses marijuana regularly. Claims that she hasn't had any alcohol in the last week. Her husband reportedly died by suicide a couple years ago when she says thinking about that is also a stressor.  Medical history: Has been diagnosed with high blood pressure in the past but currently her blood pressure is stable. She says she doesn't have any other medical complaints going on.  Social history: Lives in a house she shares with a couple of friends.  She is on disability and stays pretty idle with little activity during the day. She has adult children but feels like her family has all turned their back on her.  Substance abuse history: Has been smoking cocaine for several years probably at least 5 or so. He is just been getting worse. Regular marijuana use. Some history of alcohol abuse in the past but claims that she doesn't drink abusively anymore.  Past Psychiatric History: Patient is had several prior psychiatric hospitalizations both at our hospital and at [facility]. Other diagnoses have been entertained but she seems to me to best fit recurrent depression without psychotic features with the substance abuse playing a major role. She sees Dr. Johnn Hai at [facility] a for outpatient care and is prescribed Prozac and Abilify. Also reports that she is regularly prescribed both Ambien and clonazepam. Says that she is been compliant with her medicine. Does have a past history of suicide attempts.  Risk to Self: Is patient at risk for suicide?: Yes Risk to Others:   Prior Inpatient Therapy:   Prior Outpatient Therapy:    Past Medical History:  Past Medical History  Diagnosis Date  . Arthritis   . Bipolar 1 disorder ([place])   . Schizophrenia ([place])   . Personality disorder     Past Surgical History  Procedure Laterality Date  . Joint replacement     Family History: No family history on file. Family Psychiatric  History: Patient denies any family history of mental health or substance abuse problems Social History:  History  Alcohol Use No     History  Drug Use  .  Yes  . Special: Cocaine    Social History   Social History  . Marital Status: Widowed    Spouse Name: N/A  . Number of Children: N/A  . Years of Education: N/A   Social History Main Topics  . Smoking status: Current Every Day Smoker -- 0.50 packs/day  . Smokeless tobacco: None  . Alcohol Use: No  . Drug Use: Yes    Special: Cocaine  . Sexual Activity: Not Asked   Other Topics  Concern  . None   Social History Narrative   Additional Social History:    Allergies:   Allergies  Allergen Reactions  . Aspirin Other (See Comments)    Shortness of breath like an asthma attack    Labs:  Results for orders placed or performed during the hospital encounter of 04/02/16 (from the past 48 hour(s))  Comprehensive metabolic panel     Status: Abnormal   Collection Time: 04/02/16  3:50 PM  Result Value Ref Range   Sodium 134 (L) 135 - 145 mmol/L   Potassium 4.1 3.5 - 5.1 mmol/L   Chloride 101 101 - 111 mmol/L   CO2 26 22 - 32 mmol/L   Glucose, Bld 97 65 - 99 mg/dL   BUN 12 6 - 20 mg/dL   Creatinine, Ser 0.89 0.44 - 1.00 mg/dL   Calcium 9.4 8.9 - 10.3 mg/dL   Total Protein 7.8 6.5 - 8.1 g/dL   Albumin 4.1 3.5 - 5.0 g/dL   AST 81 (H) 15 - 41 U/L   ALT 88 (H) 14 - 54 U/L   Alkaline Phosphatase 148 (H) 38 - 126 U/L   Total Bilirubin 0.5 0.3 - 1.2 mg/dL   GFR calc non Af Amer >60 >60 mL/min   GFR calc Af Amer >60 >60 mL/min    Comment: (NOTE) The eGFR has been calculated using the CKD EPI equation. This calculation has not been validated in all clinical situations. eGFR's persistently <60 mL/min signify possible Chronic Kidney Disease.    Anion gap 7 5 - 15  Ethanol     Status: None   Collection Time: 04/02/16  3:50 PM  Result Value Ref Range   Alcohol, Ethyl (B) <5 <5 mg/dL    Comment:        LOWEST DETECTABLE LIMIT FOR SERUM ALCOHOL IS 5 mg/dL FOR MEDICAL PURPOSES ONLY   Salicylate level     Status: None   Collection Time: 04/02/16  3:50 PM  Result Value Ref Range   Salicylate Lvl <6.7 2.8 - 30.0 mg/dL  Acetaminophen level     Status: Abnormal   Collection Time: 04/02/16  3:50 PM  Result Value Ref Range   Acetaminophen (Tylenol), Serum <10 (L) 10 - 30 ug/mL    Comment:        THERAPEUTIC CONCENTRATIONS VARY SIGNIFICANTLY. A RANGE OF 10-30 ug/mL MAY BE AN EFFECTIVE CONCENTRATION FOR MANY PATIENTS. HOWEVER, SOME ARE BEST TREATED AT CONCENTRATIONS  OUTSIDE THIS RANGE. ACETAMINOPHEN CONCENTRATIONS >150 ug/mL AT 4 HOURS AFTER INGESTION AND >50 ug/mL AT 12 HOURS AFTER INGESTION ARE OFTEN ASSOCIATED WITH TOXIC REACTIONS.   cbc     Status: Abnormal   Collection Time: 04/02/16  3:50 PM  Result Value Ref Range   WBC 10.3 3.6 - 11.0 K/uL   RBC 4.73 3.80 - 5.20 MIL/uL   Hemoglobin 14.8 12.0 - 16.0 g/dL   HCT 42.4 35.0 - 47.0 %   MCV 89.6 80.0 - 100.0 fL   MCH 31.2 26.0 -  34.0 pg   MCHC 34.8 32.0 - 36.0 g/dL   RDW 15.0 (H) 11.5 - 14.5 %   Platelets 293 150 - 440 K/uL  Urine Drug Screen, Qualitative     Status: Abnormal   Collection Time: 04/02/16  4:10 PM  Result Value Ref Range   Tricyclic, Ur Screen NONE DETECTED NONE DETECTED   Amphetamines, Ur Screen NONE DETECTED NONE DETECTED   MDMA (Ecstasy)Ur Screen NONE DETECTED NONE DETECTED   Cocaine Metabolite,Ur [place] POSITIVE (A) NONE DETECTED   Opiate, Ur Screen NONE DETECTED NONE DETECTED   Phencyclidine (PCP) Ur S NONE DETECTED NONE DETECTED   Cannabinoid 50 Ng, Ur [place] POSITIVE (A) NONE DETECTED   Barbiturates, Ur Screen NONE DETECTED NONE DETECTED   Benzodiazepine, Ur Scrn NONE DETECTED NONE DETECTED   Methadone Scn, Ur NONE DETECTED NONE DETECTED    Comment: (NOTE) 622  Tricyclics, urine               Cutoff 1000 ng/mL 200  Amphetamines, urine             Cutoff 1000 ng/mL 300  MDMA (Ecstasy), urine           Cutoff 500 ng/mL 400  Cocaine Metabolite, urine       Cutoff 300 ng/mL 500  Opiate, urine                   Cutoff 300 ng/mL 600  Phencyclidine (PCP), urine      Cutoff 25 ng/mL 700  Cannabinoid, urine              Cutoff 50 ng/mL 800  Barbiturates, urine             Cutoff 200 ng/mL 900  Benzodiazepine, urine           Cutoff 200 ng/mL 1000 Methadone, urine                Cutoff 300 ng/mL 1100 1200 The urine drug screen provides only a preliminary, unconfirmed 1300 analytical test result and should not be used for non-medical 1400 purposes. Clinical consideration and  professional judgment should 1500 be applied to any positive drug screen result due to possible 1600 interfering substances. A more specific alternate chemical method 1700 must be used in order to obtain a confirmed analytical result.  1800 Gas chromato graphy / mass spectrometry (GC/MS) is the preferred 1900 confirmatory method.   Pregnancy, urine POC     Status: None   Collection Time: 04/02/16  4:12 PM  Result Value Ref Range   Preg Test, Ur NEGATIVE NEGATIVE    Comment:        THE SENSITIVITY OF THIS METHODOLOGY IS >24 mIU/mL     Current Facility-Administered Medications  Medication Dose Route Frequency Provider Last Rate Last Dose  . ARIPiprazole (ABILIFY) tablet 20 mg  20 mg Oral Daily Armen Waring T Jourdin Gens, MD      . cloNIDine (CATAPRES) tablet 0.1 mg  0.1 mg Oral BID Gonzella Lex, MD      . FLUoxetine (PROZAC) capsule 40 mg  40 mg Oral Daily Dago Jungwirth T Ac Colan, MD      . nicotine (NICODERM CQ - dosed in mg/24 hours) patch 14 mg  14 mg Transdermal Once Delman Kitten, MD      . zolpidem (AMBIEN) tablet 5 mg  5 mg Oral QHS Gonzella Lex, MD       Current Outpatient Prescriptions  Medication Sig Dispense Refill  . clonazePAM (  KLONOPIN) 1 MG tablet Take 1 mg by mouth 2 (two) times daily.  3  . cloNIDine (CATAPRES) 0.1 MG tablet Take 0.1 mg by mouth at bedtime.  3  . fluconazole (DIFLUCAN) 150 MG tablet Take 1 tablet (150 mg total) by mouth once. 1 tablet 1  . FLUoxetine (PROZAC) 40 MG capsule Take 40 mg by mouth daily.  3  . hydrochlorothiazide (HYDRODIURIL) 12.5 MG tablet Take 1 tablet (12.5 mg total) by mouth daily. 14 tablet 0  . LATUDA 80 MG TABS tablet Take 80 mg by mouth daily.  3  . Multiple Vitamins-Minerals (MULTIVITAMIN ADULT PO) Take 1 tablet by mouth daily.    . traMADol (ULTRAM) 50 MG tablet Take 1 tablet (50 mg total) by mouth every 6 (six) hours as needed. 20 tablet 0  . zolpidem (AMBIEN) 10 MG tablet Take 10 mg by mouth at bedtime.  3    Musculoskeletal: Strength &  Muscle Tone: within normal limits Gait & Station: normal Patient leans: N/A  Psychiatric Specialty Exam: Physical Exam  Nursing note and vitals reviewed. Constitutional: She appears well-developed and well-nourished.  HENT:  Head: Normocephalic and atraumatic.  Eyes: Conjunctivae are normal. Pupils are equal, round, and reactive to light.  Neck: Normal range of motion.  Cardiovascular: Regular rhythm and normal heart sounds.   Respiratory: Effort normal. No respiratory distress.  GI: Soft.  Musculoskeletal: Normal range of motion.  Neurological: She is alert.  Skin: Skin is warm and dry.  Psychiatric: Her speech is delayed. She is slowed. Cognition and memory are normal. She expresses impulsivity. She exhibits a depressed mood. She expresses suicidal ideation. She expresses suicidal plans.    Review of Systems  Constitutional: Positive for malaise/fatigue.  HENT: Negative.   Eyes: Negative.   Respiratory: Negative.   Cardiovascular: Negative.   Gastrointestinal: Negative.   Musculoskeletal: Negative.   Skin: Negative.   Neurological: Negative.   Psychiatric/Behavioral: Positive for depression, suicidal ideas and substance abuse. Negative for hallucinations and memory loss. The patient is nervous/anxious. The patient does not have insomnia.     Blood pressure 111/70, pulse 70, temperature 97.7 F (36.5 C), temperature source Oral, resp. rate 16, height _0  (1.727 m), weight 70.308 kg (155 lb), last menstrual period 03/19/2016, SpO2 97 %.Body mass index is 23.57 kg/(m^2).  General Appearance: Guarded  Eye Contact:  Fair  Speech:  Slow  Volume:  Decreased  Mood:  Depressed  Affect:  Depressed  Thought Process:  Goal Directed  Orientation:  Full (Time, Place, and Person)  Thought Content:  Logical  Suicidal Thoughts:  Yes.  with intent/plan  Homicidal Thoughts:  No  Memory:  Immediate;   Good Recent;   Fair Remote;   Fair  Judgement:  Impaired  Insight:  Fair   Psychomotor Activity:  Decreased  Concentration:  Concentration: Fair  Recall:  [employer] of Knowledge:  Fair  Language:  Fair  Akathisia:  No  Handed:  Right  AIMS (if indicated):     Assets:  Desire for Improvement Housing Physical Health  ADL's:  Intact  Cognition:  WNL  Sleep:        Treatment Plan Summary: Daily contact with patient to assess and evaluate symptoms and progress in treatment, Medication management and Plan 45 year old woman with recurrent severe depression who is reporting active suicidal ideation with thoughts of hanging herself. Has been using cocaine recently. Affect very depressed. Patient meets criteria to continue IVC and will be admitted to the psychiatry ward.  I don't think we have any beds available right now but as soon as we do we can get her admitted. Orders done. 15 minute checks in place. Check full lab panel's because of antipsychotics.  Disposition: Recommend psychiatric Inpatient admission when medically cleared. Supportive therapy provided about ongoing stressors.  Alethia Berthold, MD 04/02/2016 5:27 PM

## 2016-04-02 NOTE — ED Notes (Signed)
BEHAVIORAL HEALTH ROUNDING Patient sleeping: No. Patient alert and oriented: yes Behavior appropriate: Yes.  ; If no, describe:  Nutrition and fluids offered: Yes  Toileting and hygiene offered: Yes  Sitter present: yes Law enforcement present: Yes  

## 2016-04-02 NOTE — ED Notes (Signed)
Pt reports SI, reports she would hang herself. Denies HI, hallucinations. Pt reports hx of bipolar, schizophrenia, PTSD, anxiety, borderline personality and depression. Pt reports she has been taking her medication.

## 2016-04-02 NOTE — ED Notes (Signed)
Pt with earring in ears; reports pliers need to be used to removed earrings.

## 2016-04-02 NOTE — BH Assessment (Signed)
Assessment Note  Mercedes Forbes is an 45 y.o. female. Presented to the ED voluntarily seeking a psychiatric assessment. Pt endorse using both Cocaine and THC. Pt states that she uses a couple of time a week and has done so for years. Pt reports that she has multiple  symptoms of depression including  ecessive sleep habits, decreased appetite , anhedonia, and poor self care. Pt reports a hx of experiencing hallucinations but denies there presence at this time. Pt states that she has been having suiciadal ideas for approximately 2 weeks.  Pt states that she has more recently been having thoughts of hanging herself. She reports medication compliance and [facility].Pts husband reportedly died by suicide a couple years ago when she says thinking about that is a primary stressor.She continues to struggle with dealing with this loss.A behavioral health assessment has been completed including evaluation of the patient, collecting collateral history:, reviewing available medical/clinic records, evaluating his unique risk and protective factors, and discussing treatment recommendations.Writer is concerned for Pts safety. Pt cannot contract for safety outside of the hospital. Pt requires inpatient psychiatric hospitalization for safety common stabilization, and medication management.The patient does meet admission criteria at this time. This was explained to the pt, who voiced understanding.   Diagnosis: Major Depressive Disorder, recurrent episode, severe   Past Medical History:  Past Medical History  Diagnosis Date  . Arthritis   . Bipolar 1 disorder (HCC)   . Schizophrenia (HCC)   . Personality disorder     Past Surgical History  Procedure Laterality Date  . Joint replacement      Family History: No family history on file.  Social History:  reports that she has been smoking.  She does not have any smokeless tobacco history on file. She reports that she uses illicit drugs (Cocaine). She  reports that she does not drink alcohol.  Additional Social History:  Alcohol / Drug Use Pain Medications: See PTA  Prescriptions: See PTA Over the Counter: See PTA History of alcohol / drug use?: Yes Substance #1 Name of Substance 1: THC  Substance #2 Name of Substance 2: Cocaine   CIWA: CIWA-Ar BP: 111/70 mmHg Pulse Rate: 70 COWS:    Allergies:  Allergies  Allergen Reactions  . Aspirin Other (See Comments)    Shortness of breath like an asthma attack    Home Medications:  (Not in a hospital admission)  OB/GYN Status:  Patient's last menstrual period was 03/19/2016 (approximate).  General Assessment Data [facility] Assessment: In system Is this a Tele or Face-to-Face Assessment?: Face-to-Face Is this an Initial Assessment or a Re-assessment for this encounter?: Initial Assessment Marital status: Widowed Is patient pregnant?: No Pregnancy Status: No Living Arrangements: Non-relatives/Friends Can pt return to current living arrangement?: Yes Admission Status: Involuntary Is patient capable of signing voluntary admission?: No Referral Source: Self/Family/Friend Insurance type: Medicare   Medical Screening Exam [facility] Walk-in ONLY) Medical Exam completed: Yes  Crisis Care Plan Living Arrangements: Non-relatives/Friends Legal Guardian: Other: (None ) [facility] of Psychiatrist: [facility] Name of Therapist: [facility]  Education Status Is patient currently in school?: No Current Grade: N/A Highest grade of school patient has completed: [employer] Name of school: N/A Contact person: N/A  Risk to self with the past 6 months Suicidal Ideation: Yes-Currently Present Has patient been a risk to self within the past 6 months prior to admission? : Yes Suicidal Intent: Yes-Currently Present Has patient had any suicidal intent within the  past 6 months prior to admission? : Yes Is patient at risk for suicide?: Yes Suicidal Plan?: Yes-Currently Present Has  patient had any suicidal plan within the past 6 months prior to admission? : Yes Specify Current Suicidal Plan: To hang herself  Access to Means: Yes Specify Access to Suicidal Means: N/A What has been your use of drugs/alcohol within the last 12 months?: THC, Cocaine  Previous Attempts/Gestures: Yes How many times?: 2 Other Self Harm Risks: Pt reports a history of self mutilating bx Triggers for Past Attempts: Other (Comment) (Loss of husband ) Intentional Self Injurious Behavior: Burning, Cutting Comment - Self Injurious Behavior: cutting last, x 1week Burning x6 months ago  Family Suicide History: No Recent stressful life event(s): Loss (Comment) (loved one ) Persecutory voices/beliefs?: No Depression: Yes Depression Symptoms: Fatigue, Isolating, Loss of interest in usual pleasures, Feeling worthless/self pity Substance abuse history and/or treatment for substance abuse?: Yes Suicide prevention information given to non-admitted patients: Not applicable  Risk to Others within the past 6 months Homicidal Ideation: No Does patient have any lifetime risk of violence toward others beyond the six months prior to admission? : No Thoughts of Harm to Others: No Current Homicidal Intent: No Current Homicidal Plan: No Access to Homicidal Means: No Identified Victim: N/A History of harm to others?: No Assessment of Violence: None Noted Violent Behavior Description: None noted Does patient have access to weapons?: No Criminal Charges Pending?: No Does patient have a court date: No Is patient on probation?: Yes  Psychosis Hallucinations: None noted Delusions: None noted  Mental Status Report Appearance/Hygiene: In scrubs Eye Contact: Poor Motor Activity: Freedom of movement Speech: Slow, Logical/coherent Level of Consciousness: Alert Mood: Depressed Affect: Blunted Anxiety Level: None Thought Processes: Relevant Judgement: Impaired Orientation: Person, Place, Time,  Situation Obsessive Compulsive Thoughts/Behaviors: None  Cognitive Functioning Concentration: Fair Memory: Remote Intact, Recent Intact IQ: Average Insight: Poor Impulse Control: Poor Appetite: Fair Weight Loss: 25 Weight Gain: 0 Sleep: Increased Total Hours of Sleep: 9 Vegetative Symptoms: Staying in bed, Decreased grooming  ADLScreening [facility]) Patient's cognitive ability adequate to safely complete daily activities?: Yes Patient able to express need for assistance with ADLs?: Yes Independently performs ADLs?: Yes (appropriate for developmental age)  Prior Inpatient Therapy Prior Inpatient Therapy: Yes Prior Therapy Dates: 2016 Prior Therapy Facilty/Provider(s): [facility] Reason for Treatment: Depression/ SI   Prior Outpatient Therapy Prior Outpatient Therapy: Yes Prior Therapy Dates: Current  Prior Therapy Facilty/Provider(s): [facility]  Reason for Treatment: Depression  Does patient have an ACCT team?: No Does patient have Intensive In-House Services?  : No Does patient have [facility] services? : No Does patient have P4CC services?: No  ADL Screening (condition at time of admission) Patient's cognitive ability adequate to safely complete daily activities?: Yes Patient able to express need for assistance with ADLs?: Yes Independently performs ADLs?: Yes (appropriate for developmental age)             Merchant navy officer (For Healthcare) Does patient have an advance directive?: No Would patient like information on creating an advanced directive?: No - patient declined information    Additional Information 1:1 In Past 12 Months?: No CIRT Risk: No Elopement Risk: No Does patient have medical clearance?: Yes     Disposition:  Disposition Initial Assessment Completed for this Encounter: Yes Disposition of Patient: Inpatient treatment program Type of inpatient treatment program: Adult  On Site Evaluation by:   Reviewed with Physician:     Asa Saunas 04/02/2016 6:36 PM

## 2016-04-02 NOTE — ED Notes (Signed)
ENVIRONMENTAL ASSESSMENT Potentially harmful objects out of patient reach: Yes Personal belongings secured: Yes Patient dressed in hospital provided attire only: Yes Plastic bags out of patient reach: Yes Patient care equipment (cords, cables, call bells, lines, and drains) shortened, removed, or accounted for: Yes Equipment and supplies removed from bottom of stretcher: Yes Potentially toxic materials out of patient reach: Yes Sharps container removed or out of patient reach: Yes  Patient assigned to appropriate care area. Patient oriented to unit/care area: Informed that, for their safety, care areas are designed for safety and monitored by security cameras at all times; and visiting hours explained to patient. Patient verbalizes understanding, and verbal contract for safety obtained.  [facility] with sad, depressed mood. Positive for suicidal thoughts with plan to hang herself. She does have a history of previous suicide attempts. Patient reports occasional auditory hallucinations and feeling of paranoia. Substance abuse use includes cocaine and marijuana. Patient feels safe in the hospital.

## 2016-04-03 ENCOUNTER — Inpatient Hospital Stay
Admission: AD | Admit: 2016-04-03 | Discharge: 2016-04-05 | DRG: 885 | Disposition: A | Discharge status: Home / Self Care | Payer: Medicare Other | Source: Intra-hospital | Attending: Psychiatry | Admitting: Psychiatry

## 2016-04-03 DIAGNOSIS — F4310 Post-traumatic stress disorder, unspecified: Secondary | ICD-10-CM | POA: Diagnosis present

## 2016-04-03 DIAGNOSIS — F1420 Cocaine dependence, uncomplicated: Secondary | ICD-10-CM | POA: Diagnosis present

## 2016-04-03 DIAGNOSIS — Z9660 Presence of unspecified orthopedic joint implant: Secondary | ICD-10-CM | POA: Diagnosis present

## 2016-04-03 DIAGNOSIS — F22 Delusional disorders: Secondary | ICD-10-CM | POA: Diagnosis present

## 2016-04-03 DIAGNOSIS — Z888 Allergy status to other drugs, medicaments and biological substances status: Secondary | ICD-10-CM | POA: Diagnosis not present

## 2016-04-03 DIAGNOSIS — F410 Panic disorder [episodic paroxysmal anxiety] without agoraphobia: Secondary | ICD-10-CM | POA: Diagnosis present

## 2016-04-03 DIAGNOSIS — F17210 Nicotine dependence, cigarettes, uncomplicated: Secondary | ICD-10-CM | POA: Diagnosis present

## 2016-04-03 DIAGNOSIS — R45851 Suicidal ideations: Secondary | ICD-10-CM | POA: Diagnosis present

## 2016-04-03 DIAGNOSIS — F209 Schizophrenia, unspecified: Secondary | ICD-10-CM | POA: Diagnosis not present

## 2016-04-03 DIAGNOSIS — Z79899 Other long term (current) drug therapy: Secondary | ICD-10-CM

## 2016-04-03 DIAGNOSIS — G4700 Insomnia, unspecified: Secondary | ICD-10-CM | POA: Diagnosis present

## 2016-04-03 DIAGNOSIS — F3181 Bipolar II disorder: Principal | ICD-10-CM | POA: Diagnosis present

## 2016-04-03 DIAGNOSIS — R440 Auditory hallucinations: Secondary | ICD-10-CM | POA: Diagnosis present

## 2016-04-03 DIAGNOSIS — F4010 Social phobia, unspecified: Secondary | ICD-10-CM | POA: Diagnosis present

## 2016-04-03 DIAGNOSIS — F1220 Cannabis dependence, uncomplicated: Secondary | ICD-10-CM | POA: Diagnosis present

## 2016-04-03 DIAGNOSIS — F17200 Nicotine dependence, unspecified, uncomplicated: Secondary | ICD-10-CM | POA: Diagnosis present

## 2016-04-03 LAB — LIPID PANEL
CHOLESTEROL: 158 mg/dL (ref 0–200)
HDL: 37 mg/dL — AB (ref >40)
LDL CALC: 91 mg/dL (ref 0–99)
TRIGLYCERIDES: 149 mg/dL (ref <150)
Total CHOL/HDL Ratio: 4.3 RATIO
VLDL: 30 mg/dL (ref 0–40)

## 2016-04-03 LAB — TSH: TSH: 0.399 u[IU]/mL (ref 0.350–4.500)

## 2016-04-03 MED ORDER — CLONAZEPAM 1 MG PO TABS
1.0000 mg | ORAL_TABLET | Freq: Two times a day (BID) | ORAL | Status: DC | PRN
  Administered 2016-04-03 – 2016-04-05 (×4): 1 mg via ORAL
  Filled 2016-04-03 (×4): qty 1

## 2016-04-03 MED ORDER — MAGNESIUM HYDROXIDE 400 MG/5ML PO SUSP: 30.0000 mL | Freq: Every day | ORAL | Status: DC | PRN

## 2016-04-03 MED ORDER — ARIPIPRAZOLE 10 MG PO TABS
10.0000 mg | ORAL_TABLET | Freq: Every day | ORAL | Status: DC
  Administered 2016-04-03: 10 mg via ORAL
  Filled 2016-04-03 (×2): qty 1

## 2016-04-03 MED ORDER — FLUOXETINE HCL 20 MG PO CAPS
40.0000 mg | ORAL_CAPSULE | Freq: Every day | ORAL | Status: DC
  Administered 2016-04-03: 40 mg via ORAL
  Filled 2016-04-03 (×2): qty 2

## 2016-04-03 MED ORDER — NICOTINE 21 MG/24HR TD PT24
21.0000 mg | MEDICATED_PATCH | Freq: Every day | TRANSDERMAL | Status: DC
  Administered 2016-04-03 – 2016-04-05 (×3): 21 mg via TRANSDERMAL
  Filled 2016-04-03 (×3): qty 1

## 2016-04-03 MED ORDER — ZOLPIDEM TARTRATE 5 MG PO TABS
5.0000 mg | ORAL_TABLET | Freq: Every day | ORAL | Status: DC
  Administered 2016-04-03 – 2016-04-04 (×2): 5 mg via ORAL
  Filled 2016-04-03 (×2): qty 1

## 2016-04-03 MED ORDER — HYDROCHLOROTHIAZIDE 12.5 MG PO CAPS
12.5000 mg | ORAL_CAPSULE | Freq: Every day | ORAL | Status: DC
  Filled 2016-04-03: qty 1

## 2016-04-03 MED ORDER — CLONIDINE HCL 0.1 MG PO TABS
0.1000 mg | ORAL_TABLET | Freq: Every day | ORAL | Status: DC
  Filled 2016-04-03: qty 1

## 2016-04-03 MED ORDER — ALUM & MAG HYDROXIDE-SIMETH 200-200-20 MG/5ML PO SUSP: 30.0000 mL | ORAL | Status: DC | PRN

## 2016-04-03 MED ORDER — ACETAMINOPHEN 325 MG PO TABS: 650.0000 mg | ORAL_TABLET | Freq: Four times a day (QID) | ORAL | Status: DC | PRN

## 2016-04-03 NOTE — [facility] Group Notes (Signed)
[facility] Group Notes:  (Nursing/MHT/Case Management/Adjunct)  Date:  04/03/2016  Time:  11:21 PM  Type of Therapy:  Psychoeducational Skills  Participation Level:  Did Not Attend   Benay PikeJamila A Arlen Legendre 04/03/2016, 11:21 PM

## 2016-04-03 NOTE — Tx Team (Signed)
Initial Interdisciplinary Treatment Plan   PATIENT STRESSORS: Loss of spouse Substance abuse Traumatic event   PATIENT STRENGTHS: Capable of independent living Supportive family/friends   PROBLEM LIST: Problem List/Patient Goals Date to be addressed Date deferred Reason deferred Estimated date of resolution  Depression      Suicide Ideations      Substance abuse                                           DISCHARGE CRITERIA:  Improved stabilization in mood, thinking, and/or behavior Verbal commitment to aftercare and medication compliance  PRELIMINARY DISCHARGE PLAN: Attend aftercare/continuing care group Outpatient therapy Return to previous living arrangement  PATIENT/FAMIILY INVOLVEMENT: This treatment plan has been presented to and reviewed with the patient, Mercedes Forbes.  The patient and family have been given the opportunity to ask questions and make suggestions.  Mercedes Forbes 04/03/2016, 1:49 PM

## 2016-04-03 NOTE — ED Provider Notes (Signed)
Progress note  04/03/16 6:52 AM Patient's blood pressure was low this morning, below 100 systolic. Patient is using clonidine for detox. The nurses are going to stop the clonidine temporarily and talk with Dr. Mat Carnelay PACS this morning to see if he wants to utilize another medication since her blood pressure is dropping.  Mercedes CarryLinda M Hadrian Yarbrough, MD 04/03/16 [phone]

## 2016-04-03 NOTE — [facility] Group Notes (Signed)
Goals Group  Date/Time: 04/03/16 9am  Type of Therapy and Topic: Group Therapy: Goals Group: SMART Goals   Pt was called, but did not attend   Dawnita Molner F. Jaykwon Morones, LCSWA, LCAS  

## 2016-04-03 NOTE — Progress Notes (Signed)
D: Patient appears anxious and request PRN clonazepam. Catapres was held due to the last time it being given and BP dropped from  112/60 to 86/50 in the ED. This Clinical research associatewriter did not feel comfortable giving the patient the medication with her BP being 112/71. Patient currently denies SI/HI/AVH. She also contracts for safety. Denies pain. She did not attend wrap up group but she did go to eat snack.  A: Medication given with education. Encouragement provided.  R: Patient was compliant with medication. She has remained calm and cooperative. Safety maintained with 15 min checks.

## 2016-04-03 NOTE — Plan of Care (Signed)
Problem: Safety: Goal: Periods of time without injury will increase Outcome: Progressing Patient has remained without harm during this admission.

## 2016-04-03 NOTE — Progress Notes (Signed)
Patient states that she has been depressed since her husband killed himself about 2 years ago. She says that she uses cocaine and "pot." Patient states that she has also been having suicidal thoughts with plan to hang herself. She denies the thoughts of SI now since getting on this ward. Patient with a flat, depressed affect. A&Ox4, cooperative with assessment. Currently denies AVH. Patient was oriented to the unit. Encouraged to talk to nursing staff if she had any concerns. She verbalized understanding.

## 2016-04-03 NOTE — Progress Notes (Signed)
[facility] by Dr. Toni Amendlapacs.  Attending Physician will be Dr. Ardyth HarpsHernandez.   Patient [facility]A, [facility] Gwen.   Ashvik Grundman K. Sherlon HandingHarris, LCAS-A, LPC-A, [facility]  Counselor 04/03/2016 12:10 PM

## 2016-04-04 DIAGNOSIS — F3181 Bipolar II disorder: Principal | ICD-10-CM

## 2016-04-04 DIAGNOSIS — F4310 Post-traumatic stress disorder, unspecified: Secondary | ICD-10-CM | POA: Diagnosis present

## 2016-04-04 LAB — HEMOGLOBIN A1C: Hgb A1c MFr Bld: 6 % (ref 4.0–6.0)

## 2016-04-04 LAB — PROLACTIN: Prolactin: 12 ng/mL (ref 4.8–23.3)

## 2016-04-04 MED ORDER — ARIPIPRAZOLE 10 MG PO TABS
20.0000 mg | ORAL_TABLET | Freq: Every day | ORAL | Status: DC
  Administered 2016-04-04 – 2016-04-05 (×2): 20 mg via ORAL
  Filled 2016-04-04: qty 1
  Filled 2016-04-04: qty 2

## 2016-04-04 MED ORDER — VENLAFAXINE HCL ER 75 MG PO CP24
150.0000 mg | ORAL_CAPSULE | Freq: Every day | ORAL | Status: DC
  Administered 2016-04-04 – 2016-04-05 (×2): 150 mg via ORAL
  Filled 2016-04-04 (×2): qty 2

## 2016-04-04 MED ORDER — PRAZOSIN HCL 2 MG PO CAPS
2.0000 mg | ORAL_CAPSULE | Freq: Two times a day (BID) | ORAL | Status: DC
  Administered 2016-04-04 – 2016-04-05 (×3): 2 mg via ORAL
  Filled 2016-04-04 (×3): qty 1

## 2016-04-04 NOTE — Progress Notes (Signed)
NUTRITION ASSESSMENT  Pt identified as at risk on the Malnutrition Screen Tool  INTERVENTION: 1. Reinforce the importance of nutrition and encouraged intake of food and beverages. 2. Supplements: available for order if po intake inadequate  NUTRITION DIAGNOSIS: Unintentional weight loss related to sub-optimal intake as evidenced by pt report.   Goal: Pt to meet >/= 90% of their estimated nutrition needs.  Monitor:  PO intake  Assessment:   45 y.o. female admitted with bipolar disorder, major depressive episode. +cocaine and cannabinoid.   Height: Ht Readings from Last 1 Encounters:  04/03/16 5\' 8"  (1.727 m)    Weight: Wt Readings from Last 1 Encounters:  04/03/16 150 lb (68.04 kg)    Weight Hx: Wt Readings from Last 10 Encounters:  04/03/16 150 lb (68.04 kg)  04/02/16 155 lb (70.308 kg)  01/30/16 160 lb (72.576 kg)  11/24/15 180 lb (81.647 kg)    BMI:  Body mass index is 22.81 kg/(m^2).   Estimated Nutritional Needs: Kcal: 25-30 kcal/kg Protein: > 1 gram protein/kg Fluid: 1 ml/kcal  Diet Order: Diet regular Room service appropriate?: Yes; Fluid consistency:: Thin Pt is also offered choice of scheduled unit snacks Pt is eating as desired. Recorded po intake 85-100% of meals  Lab results and medications reviewed.   Romelle Starcherate Jerrian Mells MS, RD, LDN [phone] Pager  [phone] Weekend/On-Call Pager

## 2016-04-04 NOTE — H&P (Signed)
Psychiatric Admission Assessment Adult  Patient Identification: Mercedes Forbes MRN:  [id number] Date of Evaluation:  04/04/2016 Chief Complaint:  Major Depressive Disorder Principal Diagnosis: Bipolar 2 disorder, major depressive episode (HCC) Diagnosis:   Patient Active Problem List   Diagnosis Date Noted  . PTSD (post-traumatic stress disorder) [F43.10] 04/04/2016  . Unresolved grief [F43.21] 04/04/2016  . Suicidal ideation [R45.851] 04/03/2016  . Tobacco use disorder [F17.200] 04/03/2016  . Bipolar 2 disorder, major depressive episode (HCC) [F31.81] 04/02/2016  . Cocaine use disorder, moderate, dependence (HCC) [F14.20] 04/02/2016  . Cannabis use disorder, moderate, dependence (HCC) [F12.20] 04/02/2016   History of Present Illness:   Identifying data. Mercedes Forbes is a 45 year old female with history of bipolar depression and substance abuse.  Chief complaint. "I have not been right since my husband committed suicide."  History of present illness. Information was obtained from the patient and the chart. The patient has a Mercedes history of mental illness and has been disabled from it. She sees Dr. Suzie [facility] at [facility] regularly and takes medications as prescribed. She believes that the combination of Abilify, Prozac, clonazepam, and clonidine used to work well for her but lately she has been getting increasingly depressed. She reports poor sleep, decreased appetite, anhedonia, feeling of guilt and hopelessness worthlessness, poor energy and concentration, crying spells, social isolation. She reports heightened anxiety with more frequent panic attacks. She experiences more flashbacks and nightmares from PTSD. She started experiencing some paranoia and auditory hallucinations believing that people are in the house trying to hurt her. She denies command hallucinations. She started thinking of suicide more frequently with a plan to hang herself. This brought memories of her husband who couple years ago  committed suicide. She denies alcohol use but admits to smoking marijuana and crack.  Past psychiatric history. There were several psychiatric hospitalizations at [facility] and allergies [facility] in 2014. She has diagnosis of bipolar disorder, major depression, PTSD, panic disorder. She's been tried on numerous medications but likes her current regimen the most. There were several suicide attempts by overdose.  Family psychiatric history. Nonreported.  Social history. She is disabled from mental illness. She allow 2 friends to move in with her in a month ago but now has second thoughts about it. She has 3 children with whom she hardly stays in touch.  Total Time spent with patient: 1 hour  Is the patient at risk to self? Yes.    Has the patient been a risk to self in the past 6 months? Yes.    Has the patient been a risk to self within the distant past? Yes.    Is the patient a risk to others? No.  Has the patient been a risk to others in the past 6 months? No.  Has the patient been a risk to others within the distant past? No.   Prior Inpatient Therapy:   Prior Outpatient Therapy:    Alcohol Screening: 1. How often do you have a drink containing alcohol?: Never 9. Have you or someone else been injured as a result of your drinking?: No 10. Has a relative or friend or a doctor or another health worker been concerned about your drinking or suggested you cut down?: No Alcohol Use Disorder Identification Test Final Score (AUDIT): 0 Brief Intervention: AUDIT score less than 7 or less-screening does not suggest unhealthy drinking-brief intervention not indicated Substance Abuse History in the last 12 months:  Yes.   Consequences of Substance Abuse: Negative Previous Psychotropic Medications: Yes  Psychological Evaluations: No  Past Medical History:  Past Medical History  Diagnosis Date  . Arthritis   . Bipolar 1 disorder (HCC)   . Schizophrenia (HCC)   . Personality disorder      Past Surgical History  Procedure Laterality Date  . Joint replacement     Family History: History reviewed. No pertinent family history.   Tobacco Screening: @FLOW ([phone])::1)@ Social History:  History  Alcohol Use No     History  Drug Use  . Yes  . Special: Cocaine    Additional Social History:                           Allergies:   Allergies  Allergen Reactions  . Aspirin Other (See Comments)    Shortness of breath like an asthma attack   Lab Results:  Results for orders placed or performed during the hospital encounter of 04/03/16 (from the past 48 hour(s))  Hemoglobin A1c     Status: None   Collection Time: 04/03/16  4:18 PM  Result Value Ref Range   Hgb A1c MFr Bld 6.0 4.0 - 6.0 %  Lipid panel, fasting     Status: Abnormal   Collection Time: 04/03/16  4:18 PM  Result Value Ref Range   Cholesterol 158 0 - 200 mg/dL   Triglycerides 161149 <096<150 mg/dL   HDL 37 (L) >04>40 mg/dL   Total CHOL/HDL Ratio 4.3 RATIO   VLDL 30 0 - 40 mg/dL   LDL Cholesterol 91 0 - 99 mg/dL    Comment:        Total Cholesterol/HDL:CHD Risk Coronary Heart Disease Risk Table                     Men   Women  1/2 Average Risk   3.4   3.3  Average Risk       5.0   4.4  2 X Average Risk   9.6   7.1  3 X Average Risk  23.4   11.0        Use the calculated Patient Ratio above and the CHD Risk Table to determine the patient's CHD Risk.        ATP III CLASSIFICATION (LDL):  <100     mg/dL   Optimal  540-981100-129  mg/dL   Near or Above                    Optimal  130-159  mg/dL   Borderline  191-478160-189  mg/dL   High  >295>190     mg/dL   Very High   Prolactin     Status: None   Collection Time: 04/03/16  4:18 PM  Result Value Ref Range   Prolactin 12.0 4.8 - 23.3 ng/mL    Comment: (NOTE) Performed At: [facility] [address] [place], [place] [id number] Mila HomerHancock William F MD QI:[id number]:[phone]   TSH     Status: None   Collection Time: 04/03/16  4:18 PM  Result Value Ref  Range   TSH 0.399 0.350 - 4.500 uIU/mL    Blood Alcohol level:  Lab Results  Component Value Date   ETH <5 04/02/2016    Metabolic Disorder Labs:  Lab Results  Component Value Date   HGBA1C 6.0 04/03/2016   Lab Results  Component Value Date   PROLACTIN 12.0 04/03/2016   Lab Results  Component Value Date   CHOL 158 04/03/2016  TRIG 149 04/03/2016   HDL 37* 04/03/2016   CHOLHDL 4.3 04/03/2016   VLDL 30 04/03/2016   LDLCALC 91 04/03/2016    Current Medications: Current Facility-Administered Medications  Medication Dose Route Frequency Provider Last Rate Last Dose  . acetaminophen (TYLENOL) tablet 650 mg  650 mg Oral Q6H PRN Audery Amel, MD      . alum & mag hydroxide-simeth (MAALOX/MYLANTA) 200-200-20 MG/5ML suspension 30 mL  30 mL Oral Q4H PRN Audery Amel, MD      . ARIPiprazole (ABILIFY) tablet 20 mg  20 mg Oral Daily Jolanta B Pucilowska, MD   20 mg at 04/04/16 0844  . clonazePAM (KLONOPIN) tablet 1 mg  1 mg Oral BID PRN Shari Prows, MD   1 mg at 04/03/16 2103  . cloNIDine (CATAPRES) tablet 0.1 mg  0.1 mg Oral QHS Jolanta B Pucilowska, MD   0.1 mg at 04/03/16 2149  . magnesium hydroxide (MILK OF MAGNESIA) suspension 30 mL  30 mL Oral Daily PRN Audery Amel, MD      . nicotine (NICODERM CQ - dosed in mg/24 hours) patch 21 mg  21 mg Transdermal Daily Jolanta B Pucilowska, MD   21 mg at 04/04/16 0845  . prazosin (MINIPRESS) capsule 2 mg  2 mg Oral BID Shari Prows, MD   2 mg at 04/04/16 0844  . venlafaxine XR (EFFEXOR-XR) 24 hr capsule 150 mg  150 mg Oral Q breakfast Jolanta B Pucilowska, MD   150 mg at 04/04/16 0844  . zolpidem (AMBIEN) tablet 5 mg  5 mg Oral QHS Jolanta B Pucilowska, MD   5 mg at 04/03/16 2101   PTA Medications: Prescriptions prior to admission  Medication Sig Dispense Refill Last Dose  . clonazePAM (KLONOPIN) 1 MG tablet Take 1 mg by mouth 2 (two) times daily.  3 12/26/2014 at Unknown time  . cloNIDine (CATAPRES) 0.1 MG tablet  Take 0.1 mg by mouth at bedtime.  3 12/25/2014 at Unknown time  . fluconazole (DIFLUCAN) 150 MG tablet Take 1 tablet (150 mg total) by mouth once. (Patient not taking: Reported on 04/02/2016) 1 tablet 1 Completed Course at Unknown time  . FLUoxetine (PROZAC) 40 MG capsule Take 40 mg by mouth daily.  3 12/26/2014 at Unknown time  . hydrochlorothiazide (HYDRODIURIL) 12.5 MG tablet Take 1 tablet (12.5 mg total) by mouth daily. (Patient not taking: Reported on 04/02/2016) 14 tablet 0 Not Taking at Unknown time  . LATUDA 80 MG TABS tablet Take 80 mg by mouth daily.  3 12/26/2014 at Unknown time  . Multiple Vitamins-Minerals (MULTIVITAMIN ADULT PO) Take 1 tablet by mouth daily.   12/26/2014 at Unknown time  . traMADol (ULTRAM) 50 MG tablet Take 1 tablet (50 mg total) by mouth every 6 (six) hours as needed. (Patient not taking: Reported on 04/02/2016) 20 tablet 0 Not Taking at Unknown time  . zolpidem (AMBIEN) 10 MG tablet Take 10 mg by mouth at bedtime.  3 12/25/2014 at Unknown time    Musculoskeletal: Strength & Muscle Tone: within normal limits Gait & Station: normal Patient leans: N/A  Psychiatric Specialty Exam: Physical Exam  Nursing note and vitals reviewed. Constitutional: She is oriented to person, place, and time. She appears well-developed and well-nourished.  HENT:  Head: Normocephalic and atraumatic.  Eyes: Conjunctivae and EOM are normal.  Neck: Normal range of motion.  Cardiovascular: Normal rate, regular rhythm and normal heart sounds.   Respiratory: Effort normal and breath sounds normal.  GI: Soft.  Bowel sounds are normal.  Musculoskeletal: Normal range of motion.  Neurological: She is alert and oriented to person, place, and time.  Skin: Skin is warm.    Review of Systems  Psychiatric/Behavioral: Positive for depression, suicidal ideas, hallucinations and substance abuse. The patient is nervous/anxious and has insomnia.   All other systems reviewed and are negative.   Blood  pressure 106/55, pulse 75, temperature 98.2 F (36.8 C), temperature source Oral, resp. rate 18, height 5\' 8"  (1.727 m), weight 68.04 kg (150 lb), last menstrual period 03/19/2016, SpO2 100 %.Body mass index is 22.81 kg/(m^2).  See SRA.                                                  Sleep:  Number of Hours: 8.75       Treatment Plan Summary: Daily contact with patient to assess and evaluate symptoms and progress in treatment and Medication management   Ms. Quattrone is a 45 year old female with a history of bipolar depression and substance abuse admitted for suicidal ideation.  1. Suicidal ideation. The patient is able to contract for safety in the hospital.  2. Mood. We will continue Abilify for psychosis and mood stabilization. We will discontinue Prozac and start Effexor for depression and anxiety.  3. Anxiety. The patient reports panic attacks, social anxiety and PTSD symptoms. We'll continue clonazepam as prescribed by her primary psychiatrist. No prescriptions will be written. We will start Minipress for PTSD nightmares and flashbacks.  4. Insomnia. She is on Ambien.  5. Smoking. Nicotine patch is available.  6. Metabolic syndrome monitoring. Lipid profile, TSH, hemoglobin A1c and prolactin are pending.  7. Substance abuse. The patient uses marijuana and cocaine. She minimizes her problems and declines treatment.  8. Unresolved grief. She will follow up with the hospice grief counseling after discharge.  9. Disposition. She will be discharged to home with her roommates. She will follow up with Dr. Suzie Forbes.   Observation Level/Precautions:  15 minute checks  Laboratory:  CBC Chemistry Profile UDS UA  Psychotherapy:    Medications:    Consultations:    Discharge Concerns:    Estimated LOS:  Other:     I certify that inpatient services furnished can reasonably be expected to improve the patient's condition.    Kristine Linea,  MD 7/19/20178:54 AM

## 2016-04-04 NOTE — [facility] Group Notes (Signed)
[facility] Group Notes:  (Nursing/MHT/Case Management/Adjunct)  Date:  04/04/2016  Time:  4:07 PM  Type of Therapy:  Psychoeducational Skills  Participation Level:  Did Not Attend  Participation Quality:  Summary of Progress/Problems:  Mayra NeerJackie L Noretta Frier 04/04/2016, 4:07 PM

## 2016-04-04 NOTE — Progress Notes (Signed)
Affect flat although states that she is starting to feel better.  Denies SI.  Medicated for anxiety x 1 with good results.  Medication and group compliant.  Support and encouragement offered safety maintained.

## 2016-04-04 NOTE — [facility] Suicide Risk Assessment (Signed)
[facility] Admission Suicide Risk Assessment   Nursing information obtained from:    Demographic factors:    Current Mental Status:    Loss Factors:    Historical Factors:    Risk Reduction Factors:     Total Time spent with patient: 1 hour Principal Problem: Severe recurrent major depression without psychotic features [facility]) Diagnosis:   Patient Active Problem List   Diagnosis Date Noted  . Suicidal ideation [R45.851] 04/03/2016  . Tobacco use disorder [F17.200] 04/03/2016  . Severe recurrent major depression without psychotic features (HCC) [F33.2] 04/02/2016  . Cocaine use disorder, moderate, dependence (HCC) [F14.20] 04/02/2016  . Cannabis use disorder, moderate, dependence (HCC) [F12.20] 04/02/2016   Subjective Data: Depression, anxiety, psychosis, suicidal ideation, substance use.  Continued Clinical Symptoms:  Alcohol Use Disorder Identification Test Final Score (AUDIT): 0 The "Alcohol Use Disorders Identification Test", Guidelines for Use in Primary Care, Second Edition.  World Science writer [facility]). Score between 0-7:  no or low risk or alcohol related problems. Score between 8-15:  moderate risk of alcohol related problems. Score between 16-19:  high risk of alcohol related problems. Score 20 or above:  warrants further diagnostic evaluation for alcohol dependence and treatment.   CLINICAL FACTORS:   Severe Anxiety and/or Agitation Panic Attacks Bipolar Disorder:   Depressive phase Depression:   Comorbid alcohol abuse/dependence Impulsivity Alcohol/Substance Abuse/Dependencies   Musculoskeletal: Strength & Muscle Tone: within normal limits Gait & Station: normal Patient leans: N/A  Psychiatric Specialty Exam: Physical Exam  Nursing note and vitals reviewed.   Review of Systems  Psychiatric/Behavioral: Positive for depression, suicidal ideas, hallucinations and substance abuse. The patient is nervous/anxious and has insomnia.   All other systems reviewed and  are negative.   Blood pressure 106/55, pulse 75, temperature 98.2 F (36.8 C), temperature source Oral, resp. rate 18, height  (1.727 m), weight 68.04 kg (150 lb), last menstrual period 03/19/2016, SpO2 100 %.Body mass index is 22.81 kg/(m^2).  General Appearance: Fairly Groomed  Eye Contact:  Good  Speech:  Clear and Coherent  Volume:  Normal  Mood:  Depressed, Hopeless and Worthless  Affect:  Blunt  Thought Process:  Goal Directed  Orientation:  Full (Time, Place, and Person)  Thought Content:  Delusions, Hallucinations: Auditory and Paranoid Ideation  Suicidal Thoughts:  Yes.  with intent/plan  Homicidal Thoughts:  No  Memory:  Immediate;   Fair Recent;   Fair Remote;   Fair  Judgement:  Impaired  Insight:  Shallow  Psychomotor Activity:  Decreased  Concentration:  Concentration: Fair and Attention Span: Fair  Recall:  [employer] of Knowledge:  Fair  Language:  Fair  Akathisia:  No  Handed:  Right  AIMS (if indicated):     Assets:  Communication Skills Desire for Improvement Financial Resources/Insurance Housing Physical Health Resilience  ADL's:  Intact  Cognition:  WNL  Sleep:  Number of Hours: 8.75      COGNITIVE FEATURES THAT CONTRIBUTE TO RISK:  None    SUICIDE RISK:   Moderate:  Frequent suicidal ideation with limited intensity, and duration, some specificity in terms of plans, no associated intent, good self-control, limited dysphoria/symptomatology, some risk factors present, and identifiable protective factors, including available and accessible social support.  PLAN OF CARE: Hospital admission, medication management, grief counseling, substance abuse counseling, discharge planning.  Mercedes Forbes is a 45 year old female with a history of bipolar depression and substance abuse admitted for suicidal ideation.  1. Suicidal ideation. The patient is able to contract  for safety in the hospital.  2. Mood. We will continue Abilify for psychosis and mood  stabilization. We will discontinue Prozac and start Effexor for depression and anxiety.  3. Anxiety. The patient reports panic attacks, social anxiety and PTSD symptoms. We'll continue clonazepam as prescribed by her primary psychiatrist. No prescriptions will be written. We will start Minipress for PTSD nightmares and flashbacks.  4. Insomnia. She is on Ambien.  5. Smoking. Nicotine patch is available.  6. Metabolic syndrome monitoring. Lipid profile, TSH, hemoglobin A1c and prolactin are pending.  7. Substance abuse. The patient uses marijuana and cocaine. She minimizes her problems and declines treatment.  8. Unresolved grief. She will follow up with the hospice grief counseling after discharge.  9. Disposition. She will be discharged to home with her roommates. She will follow up with Dr. Suzie PortelaMoffitt.    I certify that inpatient services furnished can reasonably be expected to improve the patient's condition.   Mercedes LineaJolanta Pucilowska, MD 04/04/2016, 8:28 AM

## 2016-04-05 MED ORDER — VENLAFAXINE HCL ER 150 MG PO CP24: 150.0000 mg | ORAL_CAPSULE | Freq: Every day | ORAL | Status: DC

## 2016-04-05 MED ORDER — PRAZOSIN HCL 2 MG PO CAPS: 2.0000 mg | ORAL_CAPSULE | Freq: Two times a day (BID) | ORAL | Status: DC

## 2016-04-05 MED ORDER — ARIPIPRAZOLE 20 MG PO TABS: 20.0000 mg | ORAL_TABLET | Freq: Every day | ORAL | Status: DC
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
## 2016-04-05 NOTE — [facility] Suicide Risk Assessment (Signed)
[facility] INPATIENT:  Family/Significant Other Suicide Prevention Education  Suicide Prevention Education:  Education Completed; Sherran NeedsCourtney Moore, Roommate/friend [phone]),  (name of family member/significant other) has been identified by the patient as the family member/significant other with whom the patient will be residing, and identified as the person(s) who will aid the patient in the event of a mental health crisis (suicidal ideations/suicide attempt).  With written consent from the patient, the family member/significant other has been provided the following suicide prevention education, prior to the and/or following the discharge of the patient.  The suicide prevention education provided includes the following:  Suicide risk factors  Suicide prevention and interventions  National Suicide Hotline telephone number  [facility] assessment telephone number  [facility] Emergency Assistance 911  [facility] and/or Residential Mobile Crisis Unit telephone number  Request made of family/significant other to:  Remove weapons (e.g., guns, rifles, knives), all items previously/currently identified as safety concern.    Remove drugs/medications (over-the-counter, prescriptions, illicit drugs), all items previously/currently identified as a safety concern.  The family member/significant other verbalizes understanding of the suicide prevention education information provided.  The family member/significant other agrees to remove the items of safety concern listed above. Family member/friend had no further questions for CSW at this time.   Londin Antone G. Garnette CzechSampson MSW, LCSWA  04/05/2016, 11:44 AM

## 2016-04-05 NOTE — Plan of Care (Signed)
Problem: Medication: Goal: Compliance with prescribed medication regimen will improve Outcome: Progressing Pt compliant with medication regimen.   

## 2016-04-05 NOTE — Progress Notes (Signed)
  [facility] :  Will you be returning to the same living situation after discharge:  Yes,  Pt will be returning home with the suport of current roommates. At discharge, do you have transportation home?: Yes,  Pt will be picked up by roommate [name] Do you have the ability to pay for your medications: Yes,  Pt has Medicare  Release of information consent forms completed and in the chart;  Patient's signature needed at discharge.  Patient to Follow up at: Follow-up Information    [facility] Go in 2 days.   Why:  Your follow-up will be walk-in. Walk-in hours for [facility] are Mon-Fri 8:00am to 10:00am   Contact information:   [address] [place] [address] [phone]       Next level of care provider has access to [facility] Link:no  Safety Planning and Suicide Prevention discussed: Yes,  SPE was discussed with [name] (Roommate/Friend) with the consent from pt.  Have you used any form of tobacco in the last 30 days? (Cigarettes, Smokeless Tobacco, Cigars, and/or Pipes): Yes  Has patient been referred to the Quitline?: Yes, faxed on 04/05/2016  Patient has been referred for addiction treatment: Yes, Pt will follow-up with [facility] for outpatient services.  Mercedes Forbes 04/05/2016, 11:56 AM

## 2016-04-05 NOTE — [facility] Suicide Risk Assessment (Signed)
[facility]   Principal Problem: Bipolar 2 disorder, major depressive episode [facility](HCC) Discharge Diagnoses:  Patient Active Problem List   Diagnosis Date Noted  . PTSD (post-traumatic stress disorder) [F43.10] 04/04/2016  . Unresolved grief [F43.21] 04/04/2016  . Suicidal ideation [R45.851] 04/03/2016  . Tobacco use disorder [F17.200] 04/03/2016  . Bipolar 2 disorder, major depressive episode (HCC) [F31.81] 04/02/2016  . Cocaine use disorder, moderate, dependence (HCC) [F14.20] 04/02/2016  . Cannabis use disorder, moderate, dependence (HCC) [F12.20] 04/02/2016    Total Time spent with patient: 30 minutes  Musculoskeletal: Strength & Muscle Tone: within normal limits Gait & Station: normal Patient leans: N/A  Psychiatric Specialty Exam: Review of Systems  Psychiatric/Behavioral: Positive for substance abuse.  All other systems reviewed and are negative.   Blood pressure 108/79, pulse 105, temperature 97.9 F (36.6 C), temperature source Oral, resp. rate 18, height 5\' 8"  (1.727 m), weight 68.04 kg (150 lb), last menstrual period 03/19/2016, SpO2 100 %.Body mass index is 22.81 kg/(m^2).  General Appearance: Casual  Eye Contact::  Good  Speech:  Clear and Coherent409  Volume:  Normal  Mood:  Euthymic  Affect:  Appropriate  Thought Process:  Goal Directed  Orientation:  Full (Time, Place, and Person)  Thought Content:  WDL  Suicidal Thoughts:  No  Homicidal Thoughts:  No  Memory:  Immediate;   Fair Recent;   Fair Remote;   Fair  Judgement:  Impaired  Insight:  Shallow  Psychomotor Activity:  Normal  Concentration:  Fair  Recall:  FiservFair  Fund of Knowledge:Fair  Language: Fair  Akathisia:  No  Handed:  Right  AIMS (if indicated):     Assets:  Communication Skills Desire for Improvement Financial Resources/Insurance Housing Physical Health Resilience Social Support  Sleep:  Number of Hours: 6.75  Cognition: WNL  ADL's:  Intact   Mental  Status Per Nursing Assessment::   On Admission:     Demographic Factors:  Divorced or widowed and Caucasian  Loss Factors: NA  Historical Factors: Prior suicide attempts, Family history of mental illness or substance abuse and Impulsivity  Risk Reduction Factors:   Sense of responsibility to family, Living with another person, especially a relative, Positive social support and Positive therapeutic relationship  Continued Clinical Symptoms:  Bipolar Disorder:   Depressive phase Depression:   Comorbid alcohol abuse/dependence Impulsivity Alcohol/Substance Abuse/Dependencies  Cognitive Features That Contribute To Risk:  None    Suicide Risk:  Minimal: No identifiable suicidal ideation.  Patients presenting with no risk factors but with morbid ruminations; may be classified as minimal risk based on the severity of the depressive symptoms    Plan Of Care/Follow-up recommendations:  Activity:  As tolerated. Diet:  Low sodium heart healthy. Other:  Keep follow-up appointments.  Kristine LineaJolanta Loran Auguste, MD 04/05/2016, 8:54 AM

## 2016-04-05 NOTE — Tx Team (Signed)
Interdisciplinary Treatment Plan Update (Adult)  Date:  04/05/2016 Time Reviewed:  11:45 AM  Progress in Treatment: Attending groups: No. Participating in groups:  No. Taking medication as prescribed:  Yes. Tolerating medication:  Yes. Family/Significant othe contact made:  Yes, individual(s) contacted:  Christin Fudge (Roommate/friend) Patient understands diagnosis:  Yes. Discussing patient identified problems/goals with staff:  Yes. Medical problems stabilized or resolved:  Yes. Denies suicidal/homicidal ideation: Yes. Issues/concerns per patient self-inventory:  No. Other:  New problem(s) identified: N/A  Discharge Plan or Barriers: Pt plans to return home and follow-up with outpatient provider.   Reason for Continuation of Hospitalization: Depression Medication stabilization Suicidal ideation   Estimated length of stay: Pt  Likely to discharge 04/05/2016  New goal(s): N/A ?  Review of initial/current patient goals per problem list:    1.? Goal(s): Patient will participate in aftercare plan o Met: YES?  o Target date: at discharge  o As evidenced by: Patient will participate within aftercare plan AEB aftercare provider and housing plan at discharge being identified.  ? 2.? Goal (s): Patient will exhibit decreased depressive symptoms and suicidal ideations. o Met: YES?  o Target date: at discharge  o As evidenced by: Patient will utilize self rating of depression at 3 or below and demonstrate decreased signs of depression or be deemed stable for discharge by MD.   Attendees: Patient:  Mercedes Forbes 7/20/201711:45 AM  Family:   7/20/201711:45 AM  Physician:  Dr. Bary Leriche, MD 7/20/201711:45 AM  Nursing:   Victorino Sparrow. Belenda Cruise, RN 7/20/201711:45 AM  Case Manager:   7/20/201711:45 AM  Counselor:   7/20/201711:45 AM  Other:  Anthoney Harada, MSW, LCSWA 7/20/201711:45 AM  Other:   7/20/201711:45 AM  Other:   7/20/201711:45 AM  Other:  7/20/201711:45 AM  Other:   7/20/201711:45 AM  Other:  7/20/201711:45 AM  Other:  7/20/201711:45 AM  Other:  7/20/201711:45 AM  Other:  7/20/201711:45 AM  Other:   7/20/201711:45 AM   Scribe for Treatment Team:    Anthoney Harada, MSW, LCSWA 04/05/2016, 11:55am

## 2016-04-05 NOTE — Discharge Summary (Signed)
Physician Discharge Summary Note  Patient:  Mercedes Forbes is an 45 y.o., female MRN:  [id number] DOB:  1971/05/27 Patient phone:  [phone] (home)  Patient address:   [address] [place] [address],  Total Time spent with patient: 30 minutes  Date of Admission:  04/03/2016 Date of Discharge: 04/05/2016  Reason for Admission:  Suicidal ideation.  Identifying data. Mercedes Forbes is a 45 year old female with history of bipolar depression and substance abuse.  Chief complaint. "I have not been right since my husband committed suicide."  History of present illness. Information was obtained from the patient and the chart. The patient has a long history of mental illness and has been disabled from it. She sees Dr. Suzie Portela at [facility] regularly and takes medications as prescribed. She believes that the combination of Abilify, Prozac, clonazepam, and clonidine used to work well for her but lately she has been getting increasingly depressed. She reports poor sleep, decreased appetite, anhedonia, feeling of guilt and hopelessness worthlessness, poor energy and concentration, crying spells, social isolation. She reports heightened anxiety with more frequent panic attacks. She experiences more flashbacks and nightmares from PTSD. She started experiencing some paranoia and auditory hallucinations believing that people are in the house trying to hurt her. She denies command hallucinations. She started thinking of suicide more frequently with a plan to hang herself. This brought memories of her husband who couple years ago committed suicide. She denies alcohol use but admits to smoking marijuana and crack.  Past psychiatric history. There were several psychiatric hospitalizations at [facility] and allergies [facility] in 2014. She has diagnosis of bipolar disorder, major depression, PTSD, panic disorder. She's been tried on numerous medications but likes her current regimen the most. There were several  suicide attempts by overdose.  Family psychiatric history. Nonreported.  Social history. She is disabled from mental illness. She allow 2 friends to move in with her in a month ago but now has second thoughts about it. She has 3 children with whom she hardly stays in touch.  Principal Problem: Bipolar 2 disorder, major depressive episode [facility]) Discharge Diagnoses: Patient Active Problem List   Diagnosis Date Noted  . PTSD (post-traumatic stress disorder) [F43.10] 04/04/2016  . Unresolved grief [F43.21] 04/04/2016  . Suicidal ideation [R45.851] 04/03/2016  . Tobacco use disorder [F17.200] 04/03/2016  . Bipolar 2 disorder, major depressive episode (HCC) [F31.81] 04/02/2016  . Cocaine use disorder, moderate, dependence (HCC) [F14.20] 04/02/2016  . Cannabis use disorder, moderate, dependence (HCC) [F12.20] 04/02/2016   Past Medical History:  Past Medical History  Diagnosis Date  . Arthritis   . Bipolar 1 disorder (HCC)   . Schizophrenia (HCC)   . Personality disorder     Past Surgical History  Procedure Laterality Date  . Joint replacement     Family History: History reviewed. No pertinent family history.  Social History:  History  Alcohol Use No     History  Drug Use  . Yes  . Special: Cocaine    Social History   Social History  . Marital Status: Widowed    Spouse Name: N/A  . Number of Children: N/A  . Years of Education: N/A   Social History Main Topics  . Smoking status: Current Every Day Smoker -- 0.50 packs/day  . Smokeless tobacco: None  . Alcohol Use: No  . Drug Use: Yes    Special: Cocaine  . Sexual Activity: Not Asked   Other Topics Concern  . None   Social History Narrative  Hospital Course:    Mercedes Forbes is a 45 year old female with a history of bipolar depression and substance abuse admitted for suicidal ideation.  1. Suicidal ideation. This has resolved. The patient is able to contract for safety. She is forward thinking and optimistic  about the future.  2. Mood. We continued Abilify for psychosis and mood stabilization. We discontinued Prozac and started Effexor for depression and anxiety.  3. Anxiety. The patient reports panic attacks, social anxiety and PTSD symptoms. We continued clonazepam as prescribed by her primary psychiatrist. No prescriptions was written. We started Minipress for PTSD nightmares and flashbacks.  4. Insomnia. She was on Ambien.  5. Smoking. Nicotine patch was available.  6. Metabolic syndrome monitoring. Lipid profile, TSH, and hemoglobin A1c are normal. Prolactin 12.   7. Substance abuse. The patient uses marijuana and cocaine. She minimizes her problems and declines treatment.  8. Unresolved grief. She will follow up with Hospice grief counseling after discharge.  9. Disposition. She was discharged to home with her roommates. She will follow up with Dr. Suzie Portela at [facility].   Physical Findings: AIMS:  , ,  ,  ,    CIWA:    COWS:     Musculoskeletal: Strength & Muscle Tone: within normal limits Gait & Station: normal Patient leans: N/A  Psychiatric Specialty Exam: Physical Exam  Nursing note and vitals reviewed.   Review of Systems  Psychiatric/Behavioral: Positive for substance abuse. The patient is nervous/anxious.   All other systems reviewed and are negative.   Blood pressure 108/79, pulse 105, temperature 97.9 F (36.6 C), temperature source Oral, resp. rate 18, height 5\' 8"  (1.727 m), weight 68.04 kg (150 lb), last menstrual period 03/19/2016, SpO2 100 %.Body mass index is 22.81 kg/(m^2).  See SRA.                                                  Sleep:  Number of Hours: 6.75     Have you used any form of tobacco in the last 30 days? (Cigarettes, Smokeless Tobacco, Cigars, and/or Pipes): Yes  Has this patient used any form of tobacco in the last 30 days? (Cigarettes, Smokeless Tobacco, Cigars, and/or Pipes) Yes, Yes, A prescription for an FDA-approved  tobacco cessation medication was offered at discharge and the patient refused  Blood Alcohol level:  Lab Results  Component Value Date   [facility] <5 04/02/2016    Metabolic Disorder Labs:  Lab Results  Component Value Date   HGBA1C 6.0 04/03/2016   Lab Results  Component Value Date   PROLACTIN 12.0 04/03/2016   Lab Results  Component Value Date   CHOL 158 04/03/2016   TRIG 149 04/03/2016   HDL 37* 04/03/2016   CHOLHDL 4.3 04/03/2016   VLDL 30 04/03/2016   LDLCALC 91 04/03/2016    See Psychiatric Specialty Exam and Suicide Risk Assessment completed by Attending Physician prior to discharge.  Discharge destination:  Home  Is patient on multiple antipsychotic therapies at discharge:  No   Has Patient had three or more failed trials of antipsychotic monotherapy by history:  No  Recommended Plan for Multiple Antipsychotic Therapies: NA  Discharge Instructions    Diet - low sodium heart healthy    Complete by:  As directed      Increase activity slowly    Complete by:  As directed  Medication List    STOP taking these medications        fluconazole 150 MG tablet  Commonly known as:  DIFLUCAN     FLUoxetine 40 MG capsule  Commonly known as:  PROZAC     hydrochlorothiazide 12.5 MG tablet  Commonly known as:  HYDRODIURIL     LATUDA 80 MG Tabs tablet  Generic drug:  lurasidone     traMADol 50 MG tablet  Commonly known as:  ULTRAM      TAKE these medications      Indication   ARIPiprazole 20 MG tablet  Commonly known as:  ABILIFY  Take 1 tablet (20 mg total) by mouth daily.   Indication:  Rapidly Alternating Manic-Depressive Psychosis     clonazePAM 1 MG tablet  Commonly known as:  KLONOPIN  Take 1 mg by mouth 2 (two) times daily.      cloNIDine 0.1 MG tablet  Commonly known as:  CATAPRES  Take 0.1 mg by mouth at bedtime.      MULTIVITAMIN ADULT PO  Take 1 tablet by mouth daily.      prazosin 2 MG capsule  Commonly known as:  MINIPRESS   Take 1 capsule (2 mg total) by mouth 2 (two) times daily.   Indication:  PTSD     venlafaxine XR 150 MG 24 hr capsule  Commonly known as:  EFFEXOR-XR  Take 1 capsule (150 mg total) by mouth daily with breakfast.   Indication:  Major Depressive Disorder     zolpidem 10 MG tablet  Commonly known as:  AMBIEN  Take 10 mg by mouth at bedtime.          Follow-up recommendations:  Activity:  As tolerated. Diet:  Low sodium heart healthy. Other:  Keep follow-up appointments.  Comments:    Signed: Kristine LineaJolanta Rickeya Manus, MD 04/05/2016, 8:58 AM

## 2016-04-05 NOTE — Progress Notes (Signed)
D: Observed pt in room laying down. Patient alert and oriented x4. Patient denies SI/HI/AVH. Pt affect is anxious and depressed. Pt stated her day was "ok...slept all day." Pt rated anxiety 7/10 and depression 7/10, and pt stated that is "about my baseline."   A: Offered active listening and support. Provided therapeutic communication. Administered scheduled medications. Blood pressure was 118/81, and pt had two medications that lower blood pressure prescribed. Writer called Dr. Toni Amendlapacs, and he advised to hold the clonodine. Encouraged pt to attend groups and actively participate in care.  R: Pt pleasant and cooperative. Pt indicated she will try to attend more groups tomorrow. Pt medication compliant. Will continue Q15 min. checks. Safety maintained.

## 2016-04-05 NOTE — Progress Notes (Signed)
Pleasant and cooperative.  Verbalizes that she is a little anxious about going home.  Medicated x 1 with good results.  Denies SI/HI/AVH.  Verbalizes that plan is to follow-up with [facility]. Discharge instructions given, verbalized understanding.  Prescriptions and seven day supply of medications given.  Personal belongings returned.  Escorted off unit by this Clinical research associatewriter to main entrance to travel home with family.

## 2016-04-05 NOTE — [facility] Group Notes (Signed)
[facility] LCSW Group Therapy  04/05/2016 1:21 PM  Type of Therapy:  Group Therapy  Participation Level:  Did Not Attend  Modes of Intervention:  Discussion, Education, Socialization and Support  Summary of Progress/Problems: Balance in life: Patients will discuss the concept of balance and how it looks and feels to be unbalanced. Pt will identify areas in their life that is unbalanced and ways to become more balanced.    Lajuan Kovaleski L Karinne Schmader MSW, LCSWA  04/05/2016, 1:21 PM   

## 2016-06-17 ENCOUNTER — Emergency Department
Admission: EM | Admit: 2016-06-17 | Discharge: 2016-06-17 | Disposition: A | Discharge status: Home / Self Care | Payer: Medicare Other | Attending: Emergency Medicine | Admitting: Emergency Medicine

## 2016-06-17 ENCOUNTER — Emergency Department: Payer: Medicare Other

## 2016-06-17 ENCOUNTER — Encounter: Payer: Self-pay | Admitting: Emergency Medicine

## 2016-06-17 DIAGNOSIS — S63622A Sprain of interphalangeal joint of left thumb, initial encounter: Secondary | ICD-10-CM | POA: Insufficient documentation

## 2016-06-17 DIAGNOSIS — Y92007 Garden or yard of unspecified non-institutional (private) residence as the place of occurrence of the external cause: Secondary | ICD-10-CM | POA: Insufficient documentation

## 2016-06-17 DIAGNOSIS — W172XXA Fall into hole, initial encounter: Secondary | ICD-10-CM | POA: Insufficient documentation

## 2016-06-17 DIAGNOSIS — F17200 Nicotine dependence, unspecified, uncomplicated: Secondary | ICD-10-CM | POA: Diagnosis not present

## 2016-06-17 DIAGNOSIS — Y9389 Activity, other specified: Secondary | ICD-10-CM | POA: Diagnosis not present

## 2016-06-17 DIAGNOSIS — Y999 Unspecified external cause status: Secondary | ICD-10-CM | POA: Diagnosis not present

## 2016-06-17 DIAGNOSIS — S6992XA Unspecified injury of left wrist, hand and finger(s), initial encounter: Secondary | ICD-10-CM | POA: Diagnosis present

## 2016-06-17 DIAGNOSIS — Z79899 Other long term (current) drug therapy: Secondary | ICD-10-CM | POA: Diagnosis not present

## 2016-06-17 DIAGNOSIS — W19XXXA Unspecified fall, initial encounter: Secondary | ICD-10-CM

## 2016-06-17 MED ORDER — MELOXICAM 15 MG PO TABS: 15.0000 mg | ORAL_TABLET | Freq: Every day | ORAL | 0 refills | Status: DC

## 2016-06-17 MED ORDER — OXYCODONE-ACETAMINOPHEN 5-325 MG PO TABS
1.0000 | ORAL_TABLET | Freq: Once | ORAL | Status: AC
  Administered 2016-06-17: 1 via ORAL
  Filled 2016-06-17: qty 1

## 2016-06-17 NOTE — ED Provider Notes (Signed)
[facility] Emergency Department Provider Note  ____________________________________________  Time seen: Approximately 3:50 PM  I have reviewed the triage vital signs and the nursing notes.   HISTORY  Chief Complaint Fall    HPI Mercedes Forbes is a 45 y.o. female who presents to emergency department complaining of left thumb/wrist pain. Patient states that she was chasing after her dog through the yard when she stepped into a hole falling forward catching herself with the left wrist. Patient is unsure exactly how she landed but she is having pain over the MCP joint of the left wrist. Patient reports that she has arthritis to the joint. No other injury. No other complaint. No Medications prior to arrival.   Past Medical History:  Diagnosis Date  . Arthritis   . Bipolar 1 disorder (HCC)   . Personality disorder   . Schizophrenia [facility])     Patient Active Problem List   Diagnosis Date Noted  . PTSD (post-traumatic stress disorder) 04/04/2016  . Unresolved grief 04/04/2016  . Suicidal ideation 04/03/2016  . Tobacco use disorder 04/03/2016  . Bipolar 2 disorder, major depressive episode (HCC) 04/02/2016  . Cocaine use disorder, moderate, dependence (HCC) 04/02/2016  . Cannabis use disorder, moderate, dependence (HCC) 04/02/2016    Past Surgical History:  Procedure Laterality Date  . JOINT REPLACEMENT      Prior to Admission medications   Medication Sig Start Date End Date Taking? Authorizing Provider  ARIPiprazole (ABILIFY) 20 MG tablet Take 1 tablet (20 mg total) by mouth daily. 04/05/16   Shari Prows, MD  clonazePAM (KLONOPIN) 1 MG tablet Take 1 mg by mouth 2 (two) times daily. 12/11/14   Historical Provider, MD  cloNIDine (CATAPRES) 0.1 MG tablet Take 0.1 mg by mouth at bedtime. 12/10/14   Historical Provider, MD  meloxicam (MOBIC) 15 MG tablet Take 1 tablet (15 mg total) by mouth daily. 06/17/16   Delorise Royals Ranen Doolin, PA-C  Multiple  Vitamins-Minerals (MULTIVITAMIN ADULT PO) Take 1 tablet by mouth daily.    Historical Provider, MD  prazosin (MINIPRESS) 2 MG capsule Take 1 capsule (2 mg total) by mouth 2 (two) times daily. 04/05/16   Shari Prows, MD  venlafaxine XR (EFFEXOR-XR) 150 MG 24 hr capsule Take 1 capsule (150 mg total) by mouth daily with breakfast. 04/05/16   Jolanta B Pucilowska, MD  zolpidem (AMBIEN) 10 MG tablet Take 10 mg by mouth at bedtime. 12/11/14   Historical Provider, MD    Allergies Aspirin  History reviewed. No pertinent family history.  Social History Social History  Substance Use Topics  . Smoking status: Current Every Day Smoker    Packs/day: 0.50  . Smokeless tobacco: Never Used  . Alcohol use No     Review of Systems  Constitutional: No fever/chills Cardiovascular: no chest pain. Respiratory: no cough. No SOB. Musculoskeletal: Positive for left wrist/thumb pain Skin: Negative for rash, abrasions, lacerations, ecchymosis. Neurological: Negative for headaches, focal weakness or numbness. 10-point ROS otherwise negative.  ____________________________________________   PHYSICAL EXAM:  VITAL SIGNS: ED Triage Vitals  Enc Vitals Group     BP 06/17/16 1511 105/78     Pulse Rate 06/17/16 1511 98     Resp 06/17/16 1511 16     Temp 06/17/16 1511 97.5 F (36.4 C)     Temp Source 06/17/16 1511 Oral     SpO2 06/17/16 1511 99 %     Weight 06/17/16 1512 150 lb (68 kg)     Height 06/17/16  1512 5\' 8"  (1.727 m)     Head Circumference --      Peak Flow --      Pain Score 06/17/16 1512 9     Pain Loc --      Pain Edu? --      Excl. in GC? --      Constitutional: Alert and oriented. Well appearing and in no acute distress. Eyes: Conjunctivae are normal. PERRL. EOMI. Head: Atraumatic. Cardiovascular: Normal rate, regular rhythm. Normal S1 and S2.  Good peripheral circulation. Respiratory: Normal respiratory effort without tachypnea or retractions. Lungs CTAB. Good air entry to  the bases with no decreased or absent breath sounds. Musculoskeletal: Full range of motion to all extremities. No gross deformities appreciated. No deformities or gross edema noted to the left wrist but inspection. Limited range of motion to the left thumb due to pain. Patient is tender to palpation over the MCP joint and the extensor tendon on the left thumb. No palpable abnormality. Sensation Refill intact 5 digits. Positive Finkelstein test to the left thumb Neurologic:  Normal speech and language. No gross focal neurologic deficits are appreciated.  Skin:  Skin is warm, dry and intact. No rash noted. Psychiatric: Mood and affect are normal. Speech and behavior are normal. Patient exhibits appropriate insight and judgement.   ____________________________________________   LABS (all labs ordered are listed, but only abnormal results are displayed)  Labs Reviewed - No data to display ____________________________________________  EKG   ____________________________________________  RADIOLOGY Festus BarrenI, Madalin Hughart D Madalynn Pickelsimer, personally viewed and evaluated these images (plain radiographs) as part of my medical decision making, as well as reviewing the written report by the radiologist.  Dg Wrist Complete Left  Result Date: 06/17/2016 CLINICAL DATA:  Patient fell today injuring left wrist. Left wrist pain. EXAM: LEFT WRIST - COMPLETE 3+ VIEW COMPARISON:  02/04/2014 FINDINGS: No fracture.  No dislocation. There is narrowing of the trapezium first metacarpal articulation with subchondral sclerosis and marginal osteophytes, stable from the prior radiographs. Remaining joints are normally spaced and aligned. Soft tissues are unremarkable. IMPRESSION: 1. No fracture or acute abnormality. Electronically Signed   By: Amie Portlandavid  Ormond M.D.   On: 06/17/2016 15:53    ____________________________________________    PROCEDURES  Procedure(s) performed:    .Splint Application Date/Time: 06/17/2016 4:30  PM Performed by: Gala RomneyUTHRIELL, Luwanda Starr D Authorized by: Gala RomneyUTHRIELL, Tayte Mcwherter D   Consent:    Consent obtained:  Verbal   Consent given by:  Patient   Risks discussed:  Pain Pre-procedure details:    Sensation:  Normal Procedure details:    Laterality:  Left   Location:  Finger   Finger:  L thumb   Cast type:  Short arm   Splint type:  Thumb spica   Supplies:  Prefabricated splint Post-procedure details:    Pain:  Unchanged   Sensation:  Normal   Patient tolerance of procedure:  Tolerated well, no immediate complications Comments:     Thumb spica splint was fabricated. Patient's thumb was wrapped with Ace bandage to secure and then Velcro wrist brace was applied over top of Ace bandage.      Medications  oxyCODONE-acetaminophen (PERCOCET/ROXICET) 5-325 MG per tablet 1 tablet (not administered)     ____________________________________________   INITIAL IMPRESSION / ASSESSMENT AND PLAN / ED COURSE  Pertinent labs & imaging results that were available during my care of the patient were reviewed by me and considered in my medical decision making (see chart for details).  Review of the [place]RS was performed in accordance of the [facility] prior to dispensing any controlled drugs.  Clinical Course    Patient's diagnosis is consistent with Left thumb sprain. X-ray reveals no acute osseous abnormality. Underlying arthritic changes are observed to the MCP joint of the thumb. Splint is applied for symptom reduction.. Patient will be discharged home with prescriptions for anti-inflammatories for symptom control. Patient is to follow up with primary care or orthopedics as needed or otherwise directed. Patient is given ED precautions to return to the ED for any worsening or new symptoms.     ____________________________________________  FINAL CLINICAL IMPRESSION(S) / ED DIAGNOSES  Final diagnoses:  Fall, initial encounter  Sprain of interphalangeal joint of left thumb, initial  encounter      NEW MEDICATIONS STARTED DURING THIS VISIT:  New Prescriptions   MELOXICAM (MOBIC) 15 MG TABLET    Take 1 tablet (15 mg total) by mouth daily.        This chart was dictated using voice recognition software/Dragon. Despite best efforts to proofread, errors can occur which can change the meaning. Any change was purely unintentional.    Racheal Patches, PA-C 06/17/16 1634    Myrna Blazer, MD 06/17/16 2022

## 2016-06-17 NOTE — ED Triage Notes (Signed)
Mercedes Forbes and has left wrist pain

## 2016-07-20 ENCOUNTER — Encounter: Payer: Self-pay | Admitting: *Deleted

## 2016-07-20 ENCOUNTER — Emergency Department
Admission: EM | Admit: 2016-07-20 | Discharge: 2016-07-20 | Disposition: A | Discharge status: Home / Self Care | Payer: Medicare Other | Attending: Emergency Medicine | Admitting: Emergency Medicine

## 2016-07-20 DIAGNOSIS — Z5321 Procedure and treatment not carried out due to patient leaving prior to being seen by health care provider: Secondary | ICD-10-CM | POA: Diagnosis not present

## 2016-07-20 DIAGNOSIS — R197 Diarrhea, unspecified: Secondary | ICD-10-CM | POA: Insufficient documentation

## 2016-07-20 DIAGNOSIS — Z79899 Other long term (current) drug therapy: Secondary | ICD-10-CM | POA: Diagnosis not present

## 2016-07-20 DIAGNOSIS — R1030 Lower abdominal pain, unspecified: Secondary | ICD-10-CM | POA: Insufficient documentation

## 2016-07-20 DIAGNOSIS — R112 Nausea with vomiting, unspecified: Secondary | ICD-10-CM | POA: Insufficient documentation

## 2016-07-20 LAB — CBC
HCT: 39.4 % (ref 35.0–47.0)
Hemoglobin: 13.7 g/dL (ref 12.0–16.0)
MCH: 31.8 pg (ref 26.0–34.0)
MCHC: 34.9 g/dL (ref 32.0–36.0)
MCV: 91.2 fL (ref 80.0–100.0)
PLATELETS: 211 10*3/uL (ref 150–440)
RBC: 4.32 MIL/uL (ref 3.80–5.20)
RDW: 14 % (ref 11.5–14.5)
WBC: 8.8 10*3/uL (ref 3.6–11.0)

## 2016-07-20 LAB — COMPREHENSIVE METABOLIC PANEL
ALBUMIN: 4.4 g/dL (ref 3.5–5.0)
ALT: 23 U/L (ref 14–54)
AST: 27 U/L (ref 15–41)
Alkaline Phosphatase: 91 U/L (ref 38–126)
Anion gap: 8 (ref 5–15)
BILIRUBIN TOTAL: 0.3 mg/dL (ref 0.3–1.2)
BUN: 23 mg/dL — AB (ref 6–20)
CHLORIDE: 105 mmol/L (ref 101–111)
CO2: 26 mmol/L (ref 22–32)
Calcium: 9.4 mg/dL (ref 8.9–10.3)
Creatinine, Ser: 0.79 mg/dL (ref 0.44–1.00)
GFR calc Af Amer: >60 mL/min (ref >60)
GLUCOSE: 115 mg/dL — AB (ref 65–99)
Potassium: 4.3 mmol/L (ref 3.5–5.1)
Sodium: 139 mmol/L (ref 135–145)
Total Protein: 8 g/dL (ref 6.5–8.1)

## 2016-07-20 LAB — LIPASE, BLOOD: Lipase: 19 U/L (ref 11–51)

## 2016-07-20 NOTE — ED Triage Notes (Signed)
Pt complains of nausea, vomiting, diarrhea and lower abdominal pain started this morning, pt reports taking tylenol , unsure if febrile

## 2016-07-20 NOTE — ED Notes (Signed)
Called x3 to go to room 11 hall, no response

## 2016-07-22 ENCOUNTER — Inpatient Hospital Stay
Admission: AD | Admit: 2016-07-22 | Discharge: 2016-07-26 | DRG: 885 | Disposition: A | Discharge status: Home / Self Care | Payer: Medicare Other | Source: Ambulatory Visit | Attending: Psychiatry | Admitting: Psychiatry

## 2016-07-22 ENCOUNTER — Encounter: Payer: Self-pay | Admitting: Emergency Medicine

## 2016-07-22 ENCOUNTER — Emergency Department
Admission: EM | Admit: 2016-07-22 | Discharge: 2016-07-22 | Disposition: A | Discharge status: Other Acute Inpatient Hospital | Payer: Medicare Other | Attending: Emergency Medicine | Admitting: Emergency Medicine

## 2016-07-22 DIAGNOSIS — M1990 Unspecified osteoarthritis, unspecified site: Secondary | ICD-10-CM | POA: Diagnosis present

## 2016-07-22 DIAGNOSIS — F3181 Bipolar II disorder: Secondary | ICD-10-CM | POA: Diagnosis present

## 2016-07-22 DIAGNOSIS — F17210 Nicotine dependence, cigarettes, uncomplicated: Secondary | ICD-10-CM | POA: Diagnosis present

## 2016-07-22 DIAGNOSIS — F4310 Post-traumatic stress disorder, unspecified: Secondary | ICD-10-CM | POA: Diagnosis present

## 2016-07-22 DIAGNOSIS — Z818 Family history of other mental and behavioral disorders: Secondary | ICD-10-CM

## 2016-07-22 DIAGNOSIS — F17200 Nicotine dependence, unspecified, uncomplicated: Secondary | ICD-10-CM | POA: Insufficient documentation

## 2016-07-22 DIAGNOSIS — R45851 Suicidal ideations: Secondary | ICD-10-CM | POA: Diagnosis present

## 2016-07-22 DIAGNOSIS — G4700 Insomnia, unspecified: Secondary | ICD-10-CM | POA: Diagnosis present

## 2016-07-22 DIAGNOSIS — F1110 Opioid abuse, uncomplicated: Secondary | ICD-10-CM

## 2016-07-22 DIAGNOSIS — Z5181 Encounter for therapeutic drug level monitoring: Secondary | ICD-10-CM | POA: Insufficient documentation

## 2016-07-22 DIAGNOSIS — Z915 Personal history of self-harm: Secondary | ICD-10-CM | POA: Diagnosis not present

## 2016-07-22 DIAGNOSIS — F1420 Cocaine dependence, uncomplicated: Secondary | ICD-10-CM | POA: Diagnosis present

## 2016-07-22 DIAGNOSIS — F209 Schizophrenia, unspecified: Secondary | ICD-10-CM | POA: Insufficient documentation

## 2016-07-22 DIAGNOSIS — F1220 Cannabis dependence, uncomplicated: Secondary | ICD-10-CM | POA: Diagnosis present

## 2016-07-22 DIAGNOSIS — F1910 Other psychoactive substance abuse, uncomplicated: Secondary | ICD-10-CM | POA: Insufficient documentation

## 2016-07-22 DIAGNOSIS — F319 Bipolar disorder, unspecified: Secondary | ICD-10-CM | POA: Insufficient documentation

## 2016-07-22 DIAGNOSIS — Z760 Encounter for issue of repeat prescription: Secondary | ICD-10-CM | POA: Insufficient documentation

## 2016-07-22 LAB — URINE DRUG SCREEN, QUALITATIVE (ARMC ONLY)
Amphetamines, Ur Screen: NOT DETECTED
BARBITURATES, UR SCREEN: NOT DETECTED
Benzodiazepine, Ur Scrn: NOT DETECTED
CANNABINOID 50 NG, UR ~~LOC~~: POSITIVE — AB
COCAINE METABOLITE, UR ~~LOC~~: POSITIVE — AB
MDMA (ECSTASY) UR SCREEN: NOT DETECTED
Methadone Scn, Ur: NOT DETECTED
OPIATE, UR SCREEN: POSITIVE — AB
PHENCYCLIDINE (PCP) UR S: NOT DETECTED
TRICYCLIC, UR SCREEN: POSITIVE — AB

## 2016-07-22 LAB — CBC
HCT: 42.3 % (ref 35.0–47.0)
HEMOGLOBIN: 14.9 g/dL (ref 12.0–16.0)
MCH: 32.2 pg (ref 26.0–34.0)
MCHC: 35.2 g/dL (ref 32.0–36.0)
MCV: 91.5 fL (ref 80.0–100.0)
Platelets: 252 10*3/uL (ref 150–440)
RBC: 4.63 MIL/uL (ref 3.80–5.20)
RDW: 14.1 % (ref 11.5–14.5)
WBC: 8.5 10*3/uL (ref 3.6–11.0)

## 2016-07-22 LAB — COMPREHENSIVE METABOLIC PANEL
ALK PHOS: 107 U/L (ref 38–126)
ALT: 24 U/L (ref 14–54)
AST: 29 U/L (ref 15–41)
Albumin: 4.5 g/dL (ref 3.5–5.0)
Anion gap: 8 (ref 5–15)
BUN: 13 mg/dL (ref 6–20)
CALCIUM: 9.3 mg/dL (ref 8.9–10.3)
CO2: 26 mmol/L (ref 22–32)
CREATININE: 0.84 mg/dL (ref 0.44–1.00)
Chloride: 104 mmol/L (ref 101–111)
Glucose, Bld: 93 mg/dL (ref 65–99)
Potassium: 5.2 mmol/L — ABNORMAL HIGH (ref 3.5–5.1)
SODIUM: 138 mmol/L (ref 135–145)
Total Bilirubin: 0.6 mg/dL (ref 0.3–1.2)
Total Protein: 8.2 g/dL — ABNORMAL HIGH (ref 6.5–8.1)

## 2016-07-22 LAB — SALICYLATE LEVEL

## 2016-07-22 LAB — ACETAMINOPHEN LEVEL: Acetaminophen (Tylenol), Serum: <10 ug/mL — ABNORMAL LOW (ref 10–30)

## 2016-07-22 LAB — ETHANOL: Alcohol, Ethyl (B): <5 mg/dL (ref <5)

## 2016-07-22 MED ORDER — LOPERAMIDE HCL 2 MG PO CAPS
2.0000 mg | ORAL_CAPSULE | ORAL | Status: DC | PRN
  Filled 2016-07-22: qty 1

## 2016-07-22 MED ORDER — PRAZOSIN HCL 2 MG PO CAPS: 2.0000 mg | ORAL_CAPSULE | Freq: Two times a day (BID) | ORAL | 0 refills | Status: DC

## 2016-07-22 MED ORDER — VENLAFAXINE HCL ER 75 MG PO CP24: 150.0000 mg | ORAL_CAPSULE | Freq: Every day | ORAL | Status: DC

## 2016-07-22 MED ORDER — METHOCARBAMOL 750 MG PO TABS
750.0000 mg | ORAL_TABLET | Freq: Four times a day (QID) | ORAL | Status: DC | PRN
  Filled 2016-07-22: qty 1

## 2016-07-22 MED ORDER — TRAZODONE HCL 100 MG PO TABS: 100.0000 mg | ORAL_TABLET | Freq: Every evening | ORAL | Status: DC | PRN

## 2016-07-22 MED ORDER — HYDROXYZINE HCL 25 MG PO TABS
25.0000 mg | ORAL_TABLET | Freq: Three times a day (TID) | ORAL | Status: DC | PRN
  Administered 2016-07-23 – 2016-07-24 (×4): 25 mg via ORAL
  Filled 2016-07-22 (×4): qty 1

## 2016-07-22 MED ORDER — ACETAMINOPHEN 325 MG PO TABS
650.0000 mg | ORAL_TABLET | Freq: Four times a day (QID) | ORAL | Status: DC | PRN
  Administered 2016-07-23: 650 mg via ORAL
  Filled 2016-07-22: qty 2

## 2016-07-22 MED ORDER — ARIPIPRAZOLE 20 MG PO TABS: 20.0000 mg | ORAL_TABLET | Freq: Every day | ORAL | 0 refills | Status: DC

## 2016-07-22 MED ORDER — PRAZOSIN HCL 2 MG PO CAPS
2.0000 mg | ORAL_CAPSULE | Freq: Two times a day (BID) | ORAL | Status: DC
  Administered 2016-07-22 (×2): 2 mg via ORAL
  Filled 2016-07-22 (×2): qty 1

## 2016-07-22 MED ORDER — MAGNESIUM HYDROXIDE 400 MG/5ML PO SUSP: 30.0000 mL | Freq: Every day | ORAL | Status: DC | PRN

## 2016-07-22 MED ORDER — ACETAMINOPHEN 325 MG PO TABS
650.0000 mg | ORAL_TABLET | Freq: Once | ORAL | Status: AC
  Administered 2016-07-22: 650 mg via ORAL
  Filled 2016-07-22: qty 2

## 2016-07-22 MED ORDER — NICOTINE 21 MG/24HR TD PT24
21.0000 mg | MEDICATED_PATCH | Freq: Every day | TRANSDERMAL | Status: DC
  Administered 2016-07-23 – 2016-07-26 (×4): 21 mg via TRANSDERMAL
  Filled 2016-07-22 (×4): qty 1

## 2016-07-22 MED ORDER — ARIPIPRAZOLE 10 MG PO TABS
20.0000 mg | ORAL_TABLET | Freq: Every day | ORAL | Status: DC
  Administered 2016-07-22: 20 mg via ORAL
  Filled 2016-07-22: qty 2

## 2016-07-22 MED ORDER — ONDANSETRON HCL 4 MG PO TABS: 4.0000 mg | ORAL_TABLET | Freq: Three times a day (TID) | ORAL | Status: DC | PRN

## 2016-07-22 MED ORDER — ZOLPIDEM TARTRATE 5 MG PO TABS
5.0000 mg | ORAL_TABLET | Freq: Every day | ORAL | Status: DC
  Administered 2016-07-22: 5 mg via ORAL
  Filled 2016-07-22: qty 1

## 2016-07-22 MED ORDER — CLONIDINE HCL 0.1 MG PO TABS
0.1000 mg | ORAL_TABLET | Freq: Every day | ORAL | Status: DC
  Administered 2016-07-22: 0.1 mg via ORAL
  Filled 2016-07-22: qty 1

## 2016-07-22 MED ORDER — VENLAFAXINE HCL ER 150 MG PO CP24: 150.0000 mg | ORAL_CAPSULE | Freq: Every day | ORAL | 0 refills | Status: DC

## 2016-07-22 MED ORDER — CLONAZEPAM 1 MG PO TABS
1.0000 mg | ORAL_TABLET | Freq: Two times a day (BID) | ORAL | Status: DC
  Administered 2016-07-22 (×2): 1 mg via ORAL
  Filled 2016-07-22 (×2): qty 1

## 2016-07-22 MED ORDER — ALUM & MAG HYDROXIDE-SIMETH 200-200-20 MG/5ML PO SUSP: 30.0000 mL | ORAL | Status: DC | PRN

## 2016-07-22 NOTE — ED Provider Notes (Addendum)
[facility] Emergency Department Provider Note  Time seen: 12:55 PM  I have reviewed the triage vital signs and the nursing notes.   HISTORY  Chief Complaint Suicidal    HPI Mercedes Forbes is a 45 y.o. female with a past medical history of bipolar, schizophrenia, arthritis, who presents to the emergency department hoping for medication refills. According to the patient she was in jail discharged from jail 2 days ago after a three-day stay. She states the jail lost all of her medications including her Klonopin so she is here for a refill. She states she has been having thoughts of hurting herself, but when I asked if she is going to kill her self or hurt anyone now she says no. She has no plan to do so. States she has been seeing things, but states that is fairly common for her. Patient admits to alcohol use last night.  Past Medical History:  Diagnosis Date  . Arthritis   . Bipolar 1 disorder (HCC)   . Personality disorder   . Schizophrenia [facility](HCC)     Patient Active Problem List   Diagnosis Date Noted  . PTSD (post-traumatic stress disorder) 04/04/2016  . Unresolved grief 04/04/2016  . Suicidal ideation 04/03/2016  . Tobacco use disorder 04/03/2016  . Bipolar 2 disorder, major depressive episode (HCC) 04/02/2016  . Cocaine use disorder, moderate, dependence (HCC) 04/02/2016  . Cannabis use disorder, moderate, dependence (HCC) 04/02/2016    Past Surgical History:  Procedure Laterality Date  . JOINT REPLACEMENT      Prior to Admission medications   Medication Sig Start Date End Date Taking? Authorizing Provider  ARIPiprazole (ABILIFY) 20 MG tablet Take 1 tablet (20 mg total) by mouth daily. 04/05/16   Shari ProwsJolanta B Pucilowska, MD  clonazePAM (KLONOPIN) 1 MG tablet Take 1 mg by mouth 2 (two) times daily. 12/11/14   Historical Provider, MD  cloNIDine (CATAPRES) 0.1 MG tablet Take 0.1 mg by mouth at bedtime. 12/10/14   Historical Provider, MD  meloxicam (MOBIC)  15 MG tablet Take 1 tablet (15 mg total) by mouth daily. 06/17/16   Delorise RoyalsJonathan D Cuthriell, PA-C  Multiple Vitamins-Minerals (MULTIVITAMIN ADULT PO) Take 1 tablet by mouth daily.    Historical Provider, MD  prazosin (MINIPRESS) 2 MG capsule Take 1 capsule (2 mg total) by mouth 2 (two) times daily. 04/05/16   Shari ProwsJolanta B Pucilowska, MD  venlafaxine XR (EFFEXOR-XR) 150 MG 24 hr capsule Take 1 capsule (150 mg total) by mouth daily with breakfast. 04/05/16   Jolanta B Pucilowska, MD  zolpidem (AMBIEN) 10 MG tablet Take 10 mg by mouth at bedtime. 12/11/14   Historical Provider, MD    Allergies  Allergen Reactions  . Aspirin Other (See Comments)    Shortness of breath like an asthma attack    No family history on file.  Social History Social History  Substance Use Topics  . Smoking status: Current Every Day Smoker    Packs/day: 0.50  . Smokeless tobacco: Never Used  . Alcohol use No    Review of Systems Constitutional: Negative for fever. Cardiovascular: Negative for chest pain. Respiratory: Negative for shortness of breath. Gastrointestinal: Negative for abdominal pain Neurological: Negative for headache 10-point ROS otherwise negative.  ____________________________________________   PHYSICAL EXAM:  VITAL SIGNS: ED Triage Vitals  Enc Vitals Group     BP 07/22/16 1232 121/80     Pulse Rate 07/22/16 1232 92     Resp 07/22/16 1232 18     Temp  07/22/16 1232 97.7 F (36.5 C)     Temp Source 07/22/16 1232 Oral     SpO2 07/22/16 1232 98 %     Weight 07/22/16 1232 160 lb (72.6 kg)     Height 07/22/16 1232 5\' 8"  (1.727 m)     Head Circumference --      Peak Flow --      Pain Score 07/22/16 1233 8     Pain Loc --      Pain Edu? --      Excl. in GC? --     Constitutional: Alert and oriented. Well appearing and in no distress. Eyes: Normal exam ENT   Head: Normocephalic and atraumatic.   Mouth/Throat: Mucous membranes are moist. Cardiovascular: Normal rate, regular rhythm.  No murmur Respiratory: Normal respiratory effort without tachypnea nor retractions. Breath sounds are clear Gastrointestinal: Soft and nontender. No distention.  Musculoskeletal: Nontender with normal range of motion in all extremities.  Neurologic:  Normal speech and language. No gross focal neurologic deficits Skin:  Skin is warm, dry and intact.  Psychiatric: Mood and affect are normal. Speech and behavior are normal.   ____________________________________________    INITIAL IMPRESSION / ASSESSMENT AND PLAN / ED COURSE  Pertinent labs & imaging results that were available during my care of the patient were reviewed by me and considered in my medical decision making (see chart for details).  The patient presents to the emergency department hoping for medication refills. Patient states she was in jail for 3 days, was discharged from jail 2 days ago but states they lost off her medications including her Klonopin [facility] prescriptions for her medications. Patient also states she has been seeing things, and states vague suicidal thoughts. When I asked her specifically she planning to hurt herself or anyone else she states no. She has no specific plan to hurt herself. I do not believe the patient needs IVC criteria at this time. She is here voluntarily looking for medication refills. We'll have psychiatry see the patient. I discussed with the patient we could refill her noncontrolled medications.  Patient's labs have resulted, largely within normal limits besides a positive urine toxicology screen for Tri-Cyclicks, cocaine, opiates and cannabinoids. Patient medically cleared at this time currently awaiting psychiatric evaluation. I have written 30 day prescriptions for the patient's noncontrolled medications.  Patient has been seen by psychiatry. We will admit to their service for further treatment.  ____________________________________________   FINAL  CLINICAL IMPRESSION(S) / ED DIAGNOSES  Medication refill Hallucinations Schizophrenia    Minna AntisKevin Ruairi Stutsman, MD 07/22/16 1401    Minna AntisKevin Shawnetta Lein, MD 07/22/16 [phone]

## 2016-07-22 NOTE — [facility] Group Notes (Signed)
[facility] Group Notes:  (Nursing/MHT/Case Management/Adjunct)  Date:  07/22/2016  Time:  10:43 PM  Type of Therapy:  Psychoeducational Skills  Participation Level:  Did Not Attend  Participation Quality:  Late Admit.  Affect:  Summary of Progress/Problems:  Mercedes Forbes 07/22/2016, 10:43 PM

## 2016-07-22 NOTE — ED Notes (Signed)
Report was received Jodelle GreenLuAnn C., RN; Pt. Verbalizes no complaints or distress; verbalizes having S.I.; denies having Hi; H/O Bipolar D/O; also c/o hearing voices;. Continue to monitor with 15 min. Monitoring.

## 2016-07-22 NOTE — Tx Team (Signed)
Initial Treatment Plan 07/22/2016 10:59 PM Mercedes ButtonKristen R Ruby [id number]    PATIENT STRESSORS: Financial difficulties Other: Difficulty finding transportation   PATIENT STRENGTHS: Capable of independent living Communication skills Motivation for treatment/growth Religious Affiliation   PATIENT IDENTIFIED PROBLEMS: "my main problem is not having money." "I need transportation to get around."                     DISCHARGE CRITERIA:  Ability to meet basic life and health needs Improved stabilization in mood, thinking, and/or behavior Medical problems require only outpatient monitoring Motivation to continue treatment in a less acute level of care Need for constant or close observation no longer present  PRELIMINARY DISCHARGE PLAN: Attend aftercare/continuing care group Attend 12-step recovery group Outpatient therapy Return to previous living arrangement  PATIENT/FAMILY INVOLVEMENT: This treatment plan has been presented to and reviewed with the patient, Mercedes Forbes, and/or family member. The patient and family have been given the opportunity to ask questions and make suggestions.  Marla Roeunisha L Nicle Connole, RN 07/22/2016, 10:59 PM

## 2016-07-22 NOTE — Progress Notes (Signed)
EKG performed and given to RN.

## 2016-07-22 NOTE — Plan of Care (Signed)
Problem: Education: Goal: Knowledge of [facility] Education information/materials will improve Outcome: Progressing Pt was educated on unit rules. Education will continue, as it is an on-going process. Goal: Verbalization of understanding the information provided will improve Outcome: Progressing Pt verbalized understanding of unit rules and guidelines.  Problem: Coping: Goal: Ability to verbalize frustrations and anger appropriately will improve Outcome: Progressing Pt agreed to notify staff if feeling angry,sad or frustrated.  Problem: Health Behavior/Discharge Planning: Goal: Identification of resources available to assist in meeting health care needs will improve Outcome: Progressing Pt agreed to notify staff of any thoughts of suicide, and if she has come up with a plan to commit suicide.

## 2016-07-22 NOTE — ED Notes (Signed)
RM 20 °
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
## 2016-07-22 NOTE — Progress Notes (Signed)
[facility] by Dr. Toni Amendlapacs.  Attending Physician will be Dr. Ardyth HarpsHernandez.   Patient has been [facility] to room 310, by [facility] Charge Nurse [place].   ER staff is aware of the admission ( ER Sect.; ER MD;  Patient's Nurse &  Patient Access). Akosua Constantine K. Sherlon HandingHarris, LCAS-A, LPC-A, [facility]  Counselor 07/22/2016 5:48 PM

## 2016-07-22 NOTE — ED Notes (Signed)

## 2016-07-22 NOTE — ED Notes (Signed)
Pt belongings consisted of a white ring,blue jeans,white bra,black t-shirt,black hoodie, tan flip flops, a pack of cigarettes and a lighter. Pt has three earrings in left ear and four earrings in right ear that can not come out. Pt states that "they have to be cut out with pliers." Janie,RN notified about this.

## 2016-07-22 NOTE — Consult Note (Signed)
[facility] Consult   Reason for Consult:  Consult for 45 year old woman with history of mood disorder and substance abuse who presents with suicidal ideation. Referring Physician:  Paduchowski Patient Identification: Mercedes Forbes MRN:  [id number] Principal Diagnosis: Bipolar 2 disorder, major depressive episode [facility]) Diagnosis:   Patient Active Problem List   Diagnosis Date Noted  . 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 PTSD (post-traumatic stress disorder) [F43.10] 04/04/2016  . Unresolved grief [F43.21] 04/04/2016  . Suicidal ideation [R45.851] 04/03/2016  . Tobacco use disorder [F17.200] 04/03/2016  . Bipolar 2 disorder, major depressive episode ([place]) [F31.81] 04/02/2016  . Cocaine use disorder, moderate, dependence ([place]) [F14.20] 04/02/2016  . Cannabis use disorder, moderate, dependence ([place]) [F12.20] 04/02/2016    Total Time spent with patient: 1 hour  Subjective:   Mercedes Forbes is a 45 y.o. female patient admitted with "I've had no medicine since Tuesday and I'm feeling terrible".  HPI:  Patient interviewed. Chart reviewed. 45 year old woman with known history of mood disorder and substance abuse. She was in jail for several days this past week and was not given her medicine during that time. She claims that she either lost it or what it was not given back to her when she was discharged. She has not had any medicine then since the beginning of this last week. She got out of jail a couple days ago and has been back to using drugs heavily ever since. Admits to using cocaine and cannabis opiates and drinking. Mood is very depressed. Sleeping very poorly. Has had vague visual hallucinations at times. Active suicidal thoughts with thoughts of hanging herself. Denies homicidal ideation. Denies specific new physical complaints.  Social history: Says that she does have a place to live with several other people out in [place]. Not working. Just out of jail a  couple days.  Medical history: Multiple medical problems some of them related to substance abuse but nothing else specific requiring specific treatment. Some intermittent high blood pressure.  Substance abuse history: Long-standing problem with abuse of multiple drugs including cocaine and opiates. Has been involved in substance abuse treatment in the past although it sounds like it's intermittent. No history of alcohol withdrawal seizures.  Past Psychiatric History: Long history of mental health problems variously diagnosed as bipolar 2 and PTSD in addition to substance abuse problems. Multiple prior suicide attempts. Has had prior [place] admissions most recently this summer. Was stable on a combination of medicines as noted in her previous admission.  Risk to Self: Suicidal Ideation: Yes-Currently Present Suicidal Intent: No Is patient at risk for suicide?: No Suicidal Plan?: No Access to Means: No What has been your use of drugs/alcohol within the last 12 months?: past cocaine How many times?: 0 Other Self Harm Risks: none Triggers for Past Attempts: Unknown Intentional Self Injurious Behavior: None Risk to Others: Homicidal Ideation: No Thoughts of Harm to Others: No Current Homicidal Intent: No Current Homicidal Plan: No Access to Homicidal Means: No Identified Victim: none History of harm to others?: No Assessment of Violence: None Noted Violent Behavior Description: none noted Does patient have access to weapons?: No Criminal Charges Pending?: No Does patient have a court date: No Prior Inpatient Therapy: Prior Inpatient Therapy: Yes Prior Therapy Dates: 2017 Prior Therapy Facilty/Provider(s): [facility] Reason for Treatment: depression, S.A. Prior Outpatient Therapy: Prior Outpatient Therapy: Yes Prior Therapy Dates: to be seen [facility] Prior Therapy Facilty/Provider(s): [facility] Reason for Treatment: Bipolar, S.A. Does patient have an ACCT team?: No Does  patient have Intensive In-House  Services?  : No Does patient have [facility] services? : No Does patient have P4CC services?: No  Past Medical History:  Past Medical History:  Diagnosis Date  . Arthritis   . Bipolar 1 disorder ([place])   . Personality disorder   . Schizophrenia [facility])     Past Surgical History:  Procedure Laterality Date  . JOINT REPLACEMENT     Family History: No family history on file. Family Psychiatric  History: She says that her mother also was depressed and had suicidal thoughts but not behavior. Also positive family history of substance abuse Social History:  History  Alcohol Use No     History  Drug Use  . Types: Cocaine    Social History   Social History  . Marital status: Widowed    Spouse name: N/A  . Number of children: N/A  . Years of education: N/A   Social History Main Topics  . Smoking status: Current Every Day Smoker    Packs/day: 0.50  . Smokeless tobacco: Never Used  . Alcohol use No  . Drug use:     Types: Cocaine  . Sexual activity: Not Asked   Other Topics Concern  . None   Social History Narrative  . None   Additional Social History:    Allergies:   Allergies  Allergen Reactions  . Aspirin Other (See Comments)    Shortness of breath like an asthma attack    Labs:  Results for orders placed or performed during the hospital encounter of 07/22/16 (from the past 48 hour(s))  Comprehensive metabolic panel     Status: Abnormal   Collection Time: 07/22/16 12:36 PM  Result Value Ref Range   Sodium 138 135 - 145 mmol/L   Potassium 5.2 (H) 3.5 - 5.1 mmol/L   Chloride 104 101 - 111 mmol/L   CO2 26 22 - 32 mmol/L   Glucose, Bld 93 65 - 99 mg/dL   BUN 13 6 - 20 mg/dL   Creatinine, Ser 0.84 0.44 - 1.00 mg/dL   Calcium 9.3 8.9 - 10.3 mg/dL   Total Protein 8.2 (H) 6.5 - 8.1 g/dL   Albumin 4.5 3.5 - 5.0 g/dL   AST 29 15 - 41 U/L   ALT 24 14 - 54 U/L   Alkaline Phosphatase 107 38 - 126 U/L   Total Bilirubin 0.6 0.3 - 1.2 mg/dL   GFR calc non Af Amer >60  >60 mL/min   GFR calc Af Amer >60 >60 mL/min    Comment: (NOTE) The eGFR has been calculated using the CKD EPI equation. This calculation has not been validated in all clinical situations. eGFR's persistently <60 mL/min signify possible Chronic Kidney Disease.    Anion gap 8 5 - 15  Ethanol     Status: None   Collection Time: 07/22/16 12:36 PM  Result Value Ref Range   Alcohol, Ethyl (B) <5 <5 mg/dL    Comment:        LOWEST DETECTABLE LIMIT FOR SERUM ALCOHOL IS 5 mg/dL FOR MEDICAL PURPOSES ONLY   Salicylate level     Status: None   Collection Time: 07/22/16 12:36 PM  Result Value Ref Range   Salicylate Lvl <3.7 2.8 - 30.0 mg/dL  Acetaminophen level     Status: Abnormal   Collection Time: 07/22/16 12:36 PM  Result Value Ref Range   Acetaminophen (Tylenol), Serum <10 (L) 10 - 30 ug/mL    Comment:  THERAPEUTIC CONCENTRATIONS VARY SIGNIFICANTLY. A RANGE OF 10-30 ug/mL MAY BE AN EFFECTIVE CONCENTRATION FOR MANY PATIENTS. HOWEVER, SOME ARE BEST TREATED AT CONCENTRATIONS OUTSIDE THIS RANGE. ACETAMINOPHEN CONCENTRATIONS >150 ug/mL AT 4 HOURS AFTER INGESTION AND >50 ug/mL AT 12 HOURS AFTER INGESTION ARE OFTEN ASSOCIATED WITH TOXIC REACTIONS.   cbc     Status: None   Collection Time: 07/22/16 12:36 PM  Result Value Ref Range   WBC 8.5 3.6 - 11.0 K/uL   RBC 4.63 3.80 - 5.20 MIL/uL   Hemoglobin 14.9 12.0 - 16.0 g/dL   HCT 42.3 35.0 - 47.0 %   MCV 91.5 80.0 - 100.0 fL   MCH 32.2 26.0 - 34.0 pg   MCHC 35.2 32.0 - 36.0 g/dL   RDW 14.1 11.5 - 14.5 %   Platelets 252 150 - 440 K/uL  Urine Drug Screen, Qualitative     Status: Abnormal   Collection Time: 07/22/16 12:36 PM  Result Value Ref Range   Tricyclic, Ur Screen POSITIVE (A) NONE DETECTED   Amphetamines, Ur Screen NONE DETECTED NONE DETECTED   MDMA (Ecstasy)Ur Screen NONE DETECTED NONE DETECTED   Cocaine Metabolite,Ur [place] POSITIVE (A) NONE DETECTED   Opiate, Ur Screen POSITIVE (A) NONE DETECTED   Phencyclidine  (PCP) Ur S NONE DETECTED NONE DETECTED   Cannabinoid 50 Ng, Ur [place] POSITIVE (A) NONE DETECTED   Barbiturates, Ur Screen NONE DETECTED NONE DETECTED   Benzodiazepine, Ur Scrn NONE DETECTED NONE DETECTED   Methadone Scn, Ur NONE DETECTED NONE DETECTED    Comment: (NOTE) 270  Tricyclics, urine               Cutoff 1000 ng/mL 200  Amphetamines, urine             Cutoff 1000 ng/mL 300  MDMA (Ecstasy), urine           Cutoff 500 ng/mL 400  Cocaine Metabolite, urine       Cutoff 300 ng/mL 500  Opiate, urine                   Cutoff 300 ng/mL 600  Phencyclidine (PCP), urine      Cutoff 25 ng/mL 700  Cannabinoid, urine              Cutoff 50 ng/mL 800  Barbiturates, urine             Cutoff 200 ng/mL 900  Benzodiazepine, urine           Cutoff 200 ng/mL 1000 Methadone, urine                Cutoff 300 ng/mL 1100 1200 The urine drug screen provides only a preliminary, unconfirmed 1300 analytical test result and should not be used for non-medical 1400 purposes. Clinical consideration and professional judgment should 1500 be applied to any positive drug screen result due to possible 1600 interfering substances. A more specific alternate chemical method 1700 must be used in order to obtain a confirmed analytical result.  1800 Gas chromato graphy / mass spectrometry (GC/MS) is the preferred 1900 confirmatory method.     No current facility-administered medications for this encounter.    Current Outpatient Prescriptions  Medication Sig Dispense Refill  . ARIPiprazole (ABILIFY) 20 MG tablet Take 1 tablet (20 mg total) by mouth daily. 30 tablet 0  . clonazePAM (KLONOPIN) 1 MG tablet Take 1 mg by mouth 2 (two) times daily.  3  . cloNIDine (CATAPRES) 0.1 MG tablet Take 0.1 mg by  mouth at bedtime.  3  . meloxicam (MOBIC) 15 MG tablet Take 1 tablet (15 mg total) by mouth daily. 30 tablet 0  . Multiple Vitamins-Minerals (MULTIVITAMIN ADULT PO) Take 1 tablet by mouth daily.    . prazosin (MINIPRESS) 2  MG capsule Take 1 capsule (2 mg total) by mouth 2 (two) times daily. 60 capsule 0  . venlafaxine XR (EFFEXOR-XR) 150 MG 24 hr capsule Take 1 capsule (150 mg total) by mouth daily with breakfast. 30 capsule 0  . zolpidem (AMBIEN) 10 MG tablet Take 10 mg by mouth at bedtime.  3    Musculoskeletal: Strength & Muscle Tone: decreased Gait & Station: normal Patient leans: N/A  Psychiatric Specialty Exam: Physical Exam  Nursing note and vitals reviewed. Constitutional: She appears well-developed and well-nourished.  HENT:  Head: Normocephalic and atraumatic.  Eyes: Conjunctivae are normal. Pupils are equal, round, and reactive to light.  Neck: Normal range of motion.  Cardiovascular: Regular rhythm and normal heart sounds.   Respiratory: Effort normal. No respiratory distress.  GI: Soft.  Musculoskeletal: Normal range of motion.  Neurological: She is alert.  Skin: Skin is warm and dry.  Psychiatric: Her speech is delayed. She is withdrawn. Thought content is paranoid. She expresses impulsivity. She exhibits a depressed mood. She expresses suicidal ideation. She exhibits abnormal recent memory.    Review of Systems  Constitutional: Negative.   HENT: Negative.   Eyes: Negative.   Respiratory: Negative.   Cardiovascular: Negative.   Gastrointestinal: Negative.   Musculoskeletal: Negative.   Skin: Negative.   Neurological: Negative.   Psychiatric/Behavioral: Positive for depression, hallucinations, memory loss, substance abuse and suicidal ideas. The patient is nervous/anxious and has insomnia.     Blood pressure 121/80, pulse 92, temperature 97.7 F (36.5 C), temperature source Oral, resp. rate 18, height _0  (1.727 m), weight 72.6 kg (160 lb), last menstrual period 07/15/2016, SpO2 98 %.Body mass index is 24.33 kg/m.  General Appearance: Disheveled  Eye Contact:  Minimal  Speech:  Slow  Volume:  Decreased  Mood:  Depressed  Affect:  Blunt  Thought Process:  Goal Directed   Orientation:  Full (Time, Place, and Person)  Thought Content:  Logical  Suicidal Thoughts:  Yes.  with intent/plan  Homicidal Thoughts:  No  Memory:  Immediate;   Good Recent;   Fair Remote;   Fair  Judgement:  Impaired  Insight:  Shallow  Psychomotor Activity:  Decreased  Concentration:  Concentration: Fair  Recall:  [employer] of Knowledge:  Fair  Language:  Fair  Akathisia:  No  Handed:  Right  AIMS (if indicated):     Assets:  Communication Skills Desire for Improvement Physical Health  ADL's:  Intact  Cognition:  WNL  Sleep:        Treatment Plan Summary: Daily contact with patient to assess and evaluate symptoms and progress in treatment, Medication management and Plan Patient was suicidal thoughts and a history of suicidal behavior. Abusing multiple drugs. Feeling sick. Patient will be admitted back to the psychiatric ward. Restart medicines based on previous orders. I don't think she needs to have specific medicine for opiate withdrawal as she is probably only been using for about a day or so since getting out. We can provide some when necessary's for comfort measure. Supportive counseling. 15 minute checks on the unit. Full set of labs will be obtained. After and TTS.  Disposition: Recommend psychiatric Inpatient admission when medically cleared. Supportive therapy provided about ongoing stressors.  Alethia Berthold, MD 07/22/2016 3:37 PM

## 2016-07-22 NOTE — ED Triage Notes (Signed)
Pt presents voluntarily with reports of suicidal ideation. Pt reports she was recently in jail and did not receive any of her psychiatric medications. Pt reports she has been three days without medications. Pt reports hallucinations. Pt states she does not have a specific plan but has thoughts of hurting herself.

## 2016-07-22 NOTE — Progress Notes (Signed)
[facility]. [facility], Claris CheMargaret, RN. Alert and oriented x 4, respirations even and unlabored, gait steady and unassisted, no acute distress noted. Pt c/o of a headache of 8/10 but stated "I just took some Tylenol right before I came down here. It's letting up now." I'm ok." Pt was encouraged to notify this writer if headache worsens or persists. Denies SI/HI/AH, but does report experiencing visual hallucinations, stating "i'm seeing my dog. He's out of his crate. "Pt could not elaborate and stated "I don't know what this means or how to explain it. "Skin assessment done with an MHT present. Pt has bilateral bruising on the side of each calf. Pt could not tell this writer how she got the bruises. Pt requested to have a nicotine patch. On call notified and patch was ordered. Search of pt and belongings, no contraband found. Pt belongings were inventoried and recorded. Was oriented to unit and rules. Was given a snack, milk and juice. On q15 minute observation checks for safety. Will continue to monitor.

## 2016-07-22 NOTE — ED Notes (Signed)
Patient continues to endorse SI.  No plan verbalized.

## 2016-07-23 DIAGNOSIS — F3181 Bipolar II disorder: Principal | ICD-10-CM

## 2016-07-23 LAB — LIPID PANEL
Cholesterol: 214 mg/dL — ABNORMAL HIGH (ref 0–200)
HDL: 40 mg/dL — ABNORMAL LOW (ref >40)
LDL CALC: 126 mg/dL — AB (ref 0–99)
Total CHOL/HDL Ratio: 5.4 RATIO
Triglycerides: 238 mg/dL — ABNORMAL HIGH (ref <150)
VLDL: 48 mg/dL — AB (ref 0–40)

## 2016-07-23 LAB — TSH: TSH: 0.782 u[IU]/mL (ref 0.350–4.500)

## 2016-07-23 MED ORDER — ACETAMINOPHEN 325 MG PO TABS
325.0000 mg | ORAL_TABLET | Freq: Once | ORAL | Status: AC
  Administered 2016-07-23: 325 mg via ORAL
  Filled 2016-07-23: qty 1

## 2016-07-23 MED ORDER — VENLAFAXINE HCL ER 75 MG PO CP24
150.0000 mg | ORAL_CAPSULE | Freq: Every day | ORAL | Status: DC
  Administered 2016-07-23 – 2016-07-26 (×4): 150 mg via ORAL
  Filled 2016-07-23 (×4): qty 2

## 2016-07-23 MED ORDER — VENLAFAXINE HCL ER 75 MG PO CP24: 150.0000 mg | ORAL_CAPSULE | Freq: Every day | ORAL | Status: DC

## 2016-07-23 MED ORDER — MELOXICAM 7.5 MG PO TABS
15.0000 mg | ORAL_TABLET | Freq: Every day | ORAL | Status: DC
  Administered 2016-07-23 – 2016-07-26 (×4): 15 mg via ORAL
  Filled 2016-07-23 (×4): qty 2

## 2016-07-23 MED ORDER — MIRTAZAPINE 15 MG PO TABS
7.5000 mg | ORAL_TABLET | Freq: Every day | ORAL | Status: DC
  Administered 2016-07-23 – 2016-07-25 (×3): 7.5 mg via ORAL
  Filled 2016-07-23 (×3): qty 1

## 2016-07-23 MED ORDER — ARIPIPRAZOLE 10 MG PO TABS
20.0000 mg | ORAL_TABLET | Freq: Every day | ORAL | Status: DC
  Administered 2016-07-23 – 2016-07-26 (×4): 20 mg via ORAL
  Filled 2016-07-23 (×4): qty 2

## 2016-07-23 MED ORDER — PRAZOSIN HCL 1 MG PO CAPS
1.0000 mg | ORAL_CAPSULE | Freq: Every day | ORAL | Status: DC
  Administered 2016-07-23: 1 mg via ORAL
  Filled 2016-07-23: qty 1

## 2016-07-23 MED ORDER — ACETAMINOPHEN 500 MG PO TABS
1000.0000 mg | ORAL_TABLET | Freq: Four times a day (QID) | ORAL | Status: DC | PRN
  Administered 2016-07-23: 1000 mg via ORAL
  Filled 2016-07-23: qty 2
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
## 2016-07-23 NOTE — Progress Notes (Signed)
Recreation Therapy Notes  Date: 11.06.17 Time: 9:30 am Location: Craft Room  Group Topic: Self-expression  Goal Area(s) Addresses:  Patient will identify one color per emotion listed on wheel.  Patient will verbalize benefit of using art as a means of self-expression. Patient will verbalize one emotion experienced during session. Patient will be educated on other forms of self-expression.  Behavioral Response: Did not attend  Intervention: Emotion Wheel  Activity: Patients were given a worksheet with 7 emotions listed and were instructed to pick a color for each emotion.  Education: LRT educated patients on other forms of self-expression.  Education Outcome: Patient did not attend group.  Clinical Observations/Feedback: Patient did not attend group.  Tabbatha Bordelon M, LRT/CTRS 07/23/2016 10:19 AM 

## 2016-07-23 NOTE — Progress Notes (Signed)
Pt awake and alert, oriented today. Quiet, isolative, stays in room except during meal/medication administration. Reports medications got "off" after spending 3 days in jail for reportedly stealing the items she was attempting to return to a store. Denies HI/AVH. Endorses SI without a plan and is able to contract verbally for safety. Continues to wear L wrist splint. Reports headache in which tylenol is given PRN. Medication compliant.   Support and encouragement provided with use of therapeutic communication. Medications administered as ordered with education. Safety maintained with every 15 minute checks. Will continue to monitor.

## 2016-07-23 NOTE — Plan of Care (Signed)
Problem: Activity: Goal: Interest or engagement in leisure activities will improve Outcome: Not Progressing Pt isolative to room today. Quiet, withdrawn, minimal interactions, forwards little.

## 2016-07-23 NOTE — [facility] Suicide Risk Assessment (Signed)
[facility] INPATIENT:  Family/Significant Other Suicide Prevention Education  Suicide Prevention Education:  Patient Refusal for Family/Significant Other Suicide Prevention Education: The patient Zola ButtonKristen R Finger has refused to provide written consent for family/significant other to be provided Family/Significant Other Suicide Prevention Education during admission and/or prior to discharge.  Physician notified.  Lynden OxfordKadijah R Ercie Eliasen, MSW, LCSW-A 07/23/2016, 10:53 AM
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
## 2016-07-23 NOTE — Progress Notes (Signed)
Pt was admitted to [facility] due to "the jail I was in lost all my meds. I need to get more medication but I don't have the money. I was hoping to get back on my meds here." Pt was in jail for three days. Denies HI/VH, but does report that she is passively suicidal without a plan. Also reports having visual hallucinations stating "i'm seeing my dog. He's out of his crate." Pt could not elaborate on topic. Pt also admits to abusing her prescription medications of Effexor, Abilify, Minipress, Klonopin, Ambien, Mobic, and Tylenol 3. Will continue to monitor.

## 2016-07-23 NOTE — [facility] Suicide Risk Assessment (Signed)
[facility]   Nursing information obtained from:    Demographic factors:    Current Mental Status:    Loss Factors:    Historical Factors:    Risk Reduction Factors:     Total Time spent with patient: 1 hour Principal Problem: Bipolar 2 disorder, major depressive episode (HCC) Diagnosis:   Patient Active Problem List   Diagnosis Date Noted  . 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 PTSD (post-traumatic stress disorder) [F43.10] 04/04/2016  . Tobacco use disorder [F17.200] 04/03/2016  . Bipolar 2 disorder, major depressive episode (HCC) [F31.81] 04/02/2016  . Cocaine use disorder, moderate, dependence (HCC) [F14.20] 04/02/2016  . Cannabis use disorder, moderate, dependence (HCC) [F12.20] 04/02/2016   Subjective Data:   Continued Clinical Symptoms:  Alcohol Use Disorder Identification Test Final Score (AUDIT): 0 The "Alcohol Use Disorders Identification Test", Guidelines for Use in Primary Care, Second Edition.  World Science writerHealth Organization [facility](WHO). Score between 0-7:  no or low risk or alcohol related problems. Score between 8-15:  moderate risk of alcohol related problems. Score between 16-19:  high risk of alcohol related problems. Score 20 or above:  warrants further diagnostic evaluation for alcohol dependence and treatment.   CLINICAL FACTORS:   Severe Anxiety and/or Agitation Depression:   Impulsivity Alcohol/Substance Abuse/Dependencies Personality Disorders:   Cluster B More than one psychiatric diagnosis Previous Psychiatric Diagnoses and Treatments   Mus  Psychiatric Specialty Exam: Physical Exam  ROS  Blood pressure 102/65, pulse 87, temperature 98.5 F (36.9 C), temperature source Oral, resp. rate 18, height 5' 7.99" (1.727 m), weight 76.7 kg (169 lb), last menstrual period 07/15/2016, SpO2 98 %.Body mass index is 25.7 kg/m.                                                    Sleep:  Number of Hours: 7       COGNITIVE FEATURES THAT CONTRIBUTE TO RISK:  Polarized thinking    SUICIDE RISK:   Moderate:  Frequent suicidal ideation with limited intensity, and duration, some specificity in terms of plans, no associated intent, good self-control, limited dysphoria/symptomatology, some risk factors present, and identifiable protective factors, including available and accessible social support.   [facility] CARE[facility]  I certify that inpatient services furnished can reasonably be expected to improve the patient's condition.  Jimmy FootmanHernandez-Gonzalez,  Carah Barrientes, MD 07/23/2016, 4:20 PM

## 2016-07-24 LAB — PROLACTIN: PROLACTIN: 16 ng/mL (ref 4.8–23.3)

## 2016-07-24 LAB — HEMOGLOBIN A1C
HEMOGLOBIN A1C: 5.3 % (ref 4.8–5.6)
Mean Plasma Glucose: 105 mg/dL

## 2016-07-24 MED ORDER — PRAZOSIN HCL 2 MG PO CAPS
2.0000 mg | ORAL_CAPSULE | Freq: Every day | ORAL | Status: DC
  Administered 2016-07-24 – 2016-07-25 (×2): 2 mg via ORAL
  Filled 2016-07-24 (×2): qty 1

## 2016-07-24 MED ORDER — HYDROXYZINE HCL 25 MG PO TABS
25.0000 mg | ORAL_TABLET | ORAL | Status: DC | PRN
  Administered 2016-07-24 – 2016-07-26 (×8): 25 mg via ORAL
  Filled 2016-07-24 (×8): qty 1

## 2016-07-24 NOTE — Progress Notes (Signed)
D:Patient  voice of being nervous this shift . Came to Clinical research associatewriter  requesting  Medication  for  Her  nerves. A;ttending unit programing . Interacting with peers and staff . Appetite good  Patient stated slept poor last night .Stated appetite fair and energy level  Is low. Stated concentration is good . Stated on Depression scale 7 , hopeless 7   And  Anxiety 8 .( low 0-10 high) Denies suicidal  homicidal ideations  .  No auditory hallucinations  No pain concerns . Appropriate ADL'S. Interacting with peers and staff. Voice of her focus  Is to attend groups A: Encourage patient participation with unit programming . Instruction  Given on  Medication , verbalize understanding. R: Voice no other concerns. Staff continue to monitor

## 2016-07-24 NOTE — B[facility] Group Notes (Signed)
[facility] Group Notes:  (Nursing/MHT/Case Management/Adjunct)  Date:  07/24/2016  Time:  5:25 PM  Type of Therapy:  Psychoeducational Skills  Participation Level:  Active  Participation Quality:  Appropriate, Attentive and Sharing  Affect:  Appropriate  Cognitive:  Alert and Appropriate  Insight:  Appropriate  Engagement in Group:  Engaged  Modes of Intervention:  Discussion, Education and Support  Summary of Progress/Problems:  Lynelle SmokeCara Travis Nochum Fenter 07/24/2016, 5:25 PM

## 2016-07-24 NOTE — Progress Notes (Signed)
D: Pt is pleasant and cooperative this evening. Minimal interaction with peers noted. Pt reports a desire for medication management while here. She rates anxiety 7/10 and requests PRN medication appropriately. Pt reports passive SI and contracts for safety. Denies HI/AVH at this time, although she states "I was seeing ants crawling across the floor earlier. I don't think they were real." PT c/o h/a and requests PRN medication. A: Emotional support and encouragement provided. Medications administered with education. q15 minute safety checks maintained. R: Pt remains free from harm. Will continue to monitor.

## 2016-07-24 NOTE — Progress Notes (Signed)
[facility]  07/24/2016 8:41 AM Mercedes Forbes  MRN:  [id number] Subjective:  45 year old woman with known history of mood disorder and substance abuse. She was in jail for several days this past week and was not given her medicine during that time. She claims that she either lost it or what it was not given back to her when she was discharged. She has not had any medicine then since the beginning of this last week. She got out of jail a couple days ago and has been back to using drugs heavily ever since. Admits to using cocaine and cannabis opiates and drinking. Mood is very depressed. Sleeping very poorly. Has had vague visual hallucinations at times. Active suicidal thoughts with thoughts of hanging herself.  "I just want to be dead". Denies homicidal ideation.   Today the patient reports feeling somewhat better. He states that the suicidal ideation is not as intense as it was yesterday. She is slept throughout the night however she complains that she had multiple nightmares. Patient continues to have depressed mood and hopelessness. As far as appetite and energy they're improving. Patient feels better now that she has been taking her medications. She denies homicidal ideation or auditory or visual hallucinations to me. Denies physical complaints or side effects  Per nursing: D: Pt is pleasant and cooperative this evening. Minimal interaction with peers noted. Pt reports a desire for medication management while here. She rates anxiety 7/10 and requests PRN medication appropriately. Pt reports passive SI and contracts for safety. Denies HI/AVH at this time, although she states "I was seeing ants crawling across the floor earlier. I don't think they were real." PT c/o h/a and requests PRN medication. A: Emotional support and encouragement provided. Medications administered with education. q15 minute safety checks maintained. R: Pt remains free from harm. Will continue to monitor.  Principal  Problem: Bipolar 2 disorder, major depressive episode (HCC) Diagnosis:   Patient Active Problem List   Diagnosis Date Noted  . 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 PTSD (post-traumatic stress disorder) [F43.10] 04/04/2016  . Tobacco use disorder [F17.200] 04/03/2016  . Bipolar 2 disorder, major depressive episode (HCC) [F31.81] 04/02/2016  . Cocaine use disorder, moderate, dependence (HCC) [F14.20] 04/02/2016  . Cannabis use disorder, moderate, dependence (HCC) [F12.20] 04/02/2016   Total Time spent with patient: 30 minutes  Past Psychiatric History: Long history of mental health problems variously diagnosed as bipolar 2 and PTSD in addition to substance abuse problems. Multiple prior suicide attempts. Has had prior us admissions most recently this summer. Was stable on a combination of medicines as noted in her previous admission.  Past Medical History: Multiple medical problems some of them related to substance abuse but nothing else specific requiring specific treatment. Some intermittent high blood pressure. Past Medical History:  Diagnosis Date  . Arthritis   . Bipolar 1 disorder (HCC)   . Personality disorder   . Schizophrenia [facility](HCC)     Past Surgical History:  Procedure Laterality Date  . JOINT REPLACEMENT     Family History: History reviewed. No pertinent family history.   Family Psychiatric  History: Patient reports that her mother had depression and her father was diagnosed with dementia.  Social History: Says that she does have a place to live with several other people out in [place]. Not working. Just out of jail a couple days.  Patient has been married 3 times. She has 3 children from her early marriage ages [age] and [age] and  29. The patient did 3 years in college was pursuing an accounting degree. She does her last husband due to suicide 3 years ago. History  Alcohol Use No     History  Drug Use  . Types: Cocaine    Social History   Social History  .  Marital status: Widowed    Spouse name: N/A  . Number of children: N/A  . Years of education: N/A   Social History Main Topics  . Smoking status: Current Every Day Smoker    Packs/day: 1.00  . Smokeless tobacco: Never Used     Comment: Nicotine patch was ordered  . Alcohol use No  . Drug use:     Types: Cocaine  . Sexual activity: No   Other Topics Concern  . None   Social History Narrative  . None    Current Medications: Current Facility-Administered Medications  Medication Dose Route Frequency Provider Last Rate Last Dose  . acetaminophen (TYLENOL) tablet 1,000 mg  1,000 mg Oral Q6H PRN Jimmy FootmanAndrea Hernandez-Gonzalez, MD   1,000 mg at 07/23/16 2137  . alum & mag hydroxide-simeth (MAALOX/MYLANTA) 200-200-20 MG/5ML suspension 30 mL  30 mL Oral Q4H PRN Audery AmelJohn T Clapacs, MD      . ARIPiprazole (ABILIFY) tablet 20 mg  20 mg Oral Daily Jimmy FootmanAndrea Hernandez-Gonzalez, MD   20 mg at 07/23/16 1114  . hydrOXYzine (ATARAX/VISTARIL) tablet 25 mg  25 mg Oral TID PRN Audery AmelJohn T Clapacs, MD   25 mg at 07/23/16 2136  . magnesium hydroxide (MILK OF MAGNESIA) suspension 30 mL  30 mL Oral Daily PRN Audery AmelJohn T Clapacs, MD      . meloxicam (MOBIC) tablet 15 mg  15 mg Oral Daily Jimmy FootmanAndrea Hernandez-Gonzalez, MD   15 mg at 07/23/16 1114  . mirtazapine (REMERON) tablet 7.5 mg  7.5 mg Oral QHS Jimmy FootmanAndrea Hernandez-Gonzalez, MD   7.5 mg at 07/23/16 2136  . nicotine (NICODERM CQ - dosed in mg/24 hours) patch 21 mg  21 mg Transdermal Daily Audery AmelJohn T Clapacs, MD   21 mg at 07/23/16 0900  . prazosin (MINIPRESS) capsule 1 mg  1 mg Oral QHS Jimmy FootmanAndrea Hernandez-Gonzalez, MD   1 mg at 07/23/16 2135  . venlafaxine XR (EFFEXOR-XR) 24 hr capsule 150 mg  150 mg Oral Q breakfast Jimmy FootmanAndrea Hernandez-Gonzalez, MD   150 mg at 07/23/16 1142    Lab Results:  Results for orders placed or performed during the hospital encounter of 07/22/16 (from the past 48 hour(s))  Hemoglobin A1c     Status: None   Collection Time: 07/23/16  6:29 AM  Result Value Ref  Range   Hgb A1c MFr Bld 5.3 4.8 - 5.6 %    Comment: (NOTE)         Pre-diabetes: 5.7 - 6.4         Diabetes: >6.4         Glycemic control for adults with diabetes: <7.0    Mean Plasma Glucose 105 mg/dL    Comment: (NOTE) Performed At: [facility] [address] [place], [place] [id number] Mila HomerHancock William F MD MW:[phone]   Lipid panel     Status: Abnormal   Collection Time: 07/23/16  6:29 AM  Result Value Ref Range   Cholesterol 214 (H) 0 - 200 mg/dL   Triglycerides 403238 (H) <150 mg/dL   HDL 40 (L) >47>40 mg/dL   Total CHOL/HDL Ratio 5.4 RATIO   VLDL 48 (H) 0 - 40 mg/dL   LDL Cholesterol 425126 (H) 0 -  99 mg/dL    Comment:        Total Cholesterol/HDL:CHD Risk Coronary Heart Disease Risk Table                     Men   Women  1/2 Average Risk   3.4   3.3  Average Risk       5.0   4.4  2 X Average Risk   9.6   7.1  3 X Average Risk  23.4   11.0        Use the calculated Patient Ratio above and the CHD Risk Table to determine the patient's CHD Risk.        ATP III CLASSIFICATION (LDL):  <100     mg/dL   Optimal  161-096  mg/dL   Near or Above                    Optimal  130-159  mg/dL   Borderline  045-409  mg/dL   High  >811     mg/dL   Very High   Prolactin     Status: None   Collection Time: 07/23/16  6:29 AM  Result Value Ref Range   Prolactin 16.0 4.8 - 23.3 ng/mL    Comment: (NOTE) Performed At: [facility] [address] [place], [place] [id number] Mila Homer MD [id number]   TSH     Status: None   Collection Time: 07/23/16  6:29 AM  Result Value Ref Range   TSH 0.782 0.350 - 4.500 uIU/mL    Comment: Performed by a 3rd Generation assay with a functional sensitivity of <=0.01 uIU/mL.    Blood Alcohol level:  Lab Results  Component Value Date   [facility] <5 07/22/2016   ETH <5 04/02/2016    Metabolic Disorder Labs: Lab Results  Component Value Date   HGBA1C 5.3 07/23/2016   MPG 105 07/23/2016   Lab Results  Component Value  Date   PROLACTIN 16.0 07/23/2016   PROLACTIN 12.0 04/03/2016   Lab Results  Component Value Date   CHOL 214 (H) 07/23/2016   TRIG 238 (H) 07/23/2016   HDL 40 (L) 07/23/2016   CHOLHDL 5.4 07/23/2016   VLDL 48 (H) 07/23/2016   LDLCALC 126 (H) 07/23/2016   LDLCALC 91 04/03/2016    Physical Findings: AIMS:  , ,  ,  ,    CIWA:    COWS:     Musculoskeletal: Strength & Muscle Tone: within normal limits Gait & Station: normal Patient leans: N/A  Psychiatric Specialty Exam: Physical Exam  Constitutional: She is oriented to person, place, and time. She appears well-developed and well-nourished.  HENT:  Head: Normocephalic and atraumatic.  Eyes: Conjunctivae and EOM are normal.  Neck: Normal range of motion.  Respiratory: Effort normal.  Musculoskeletal: Normal range of motion.  Neurological: She is alert and oriented to person, place, and time.    Review of Systems  Constitutional: Negative.   HENT: Negative.   Eyes: Negative.   Respiratory: Negative.   Cardiovascular: Negative.   Gastrointestinal: Negative.   Genitourinary: Negative.   Musculoskeletal: Negative.   Skin: Negative.   Neurological: Negative.   Endo/Heme/Allergies: Negative.   Psychiatric/Behavioral: Positive for depression, substance abuse and suicidal ideas. The patient is nervous/anxious.     Blood pressure 115/70, pulse 72, temperature 98.1 F (36.7 C), temperature source Oral, resp. rate 20, height 5' 7.99" (1.727 m), weight 76.7 kg (169 lb), last menstrual period 07/15/2016, SpO2  98 %.Body mass index is 25.7 kg/m.  General Appearance: Well Groomed  Eye Contact:  Good  Speech:  Clear and Coherent  Volume:  Normal  Mood:  Dysphoric  Affect:  Blunt  Thought Process:  Linear and Descriptions of Associations: Intact  Orientation:  Full (Time, Place, and Person)  Thought Content:  Hallucinations: Visual  Suicidal Thoughts:  No  Homicidal Thoughts:  No  Memory:  Immediate;   Good Recent;    Good Remote;   Good  Judgement:  Fair  Insight:  Lacking  Psychomotor Activity:  Decreased  Concentration:  Concentration: Fair and Attention Span: Fair  Recall:  Fair  Fund of Knowledge:  Good  Language:  Good  Akathisia:  No  Handed:    AIMS (if indicated):     Assets:  Communication Skills Physical Health  ADL's:  Intact  Cognition:  WNL  Sleep:  Number of Hours: 7.5     Treatment Plan Summary:  Bipolar disorder continue Effexor XR 150 mg by mouth daily and continue Abilify 20 mg a day  Insomnia: continue mirtazapine 7.5 mg by mouth daily at bedtime.  Anxiety patient will be prescribed with Vistaril when necessary q 4 h Max 3 tabs per day  PTSD: continue prazosin 2 mg qhs  Arthritis:  continued on Mobic 15 mg by mouth daily  Tobacco use disorder patient has been started on a nicotine patch at 21 mg a day  Cocaine and cannabis use disorder: Patient is in need of intensive outpatient substance abuse treatment upon discharge  Disposition once stable she will be discharged back to her home  Follow she will continue to follow-up with [facility]. Patient has been referred to community support team however she only has Medicare. We will follow up with [facility] to see if she will be a legible for this service sometimes carting I will agree with paying for it. One of the problems with this patient is a lack of transportation to her appointments.  Per [facility] she will be able to start CST immediately after discharge   [name], MD 07/24/2016, 8:41 AM

## 2016-07-24 NOTE — Plan of Care (Signed)
Problem: Safety: Goal: Periods of time without injury will increase Outcome: Progressing Pt remains free from harm.  Problem: Coping: Goal: Ability to verbalize feelings will improve Outcome: Progressing Pt reports increased depression and anxiety. She requests PRN medication appropriately. Pt reports passive SI and contracts for safety. She verbalizes a desire for medication management.  Problem: Medication: Goal: Compliance with prescribed medication regimen will improve Outcome: Progressing Pt taking medications as prescribed. Verbalizes understanding of information provided.

## 2016-07-24 NOTE — Progress Notes (Signed)
Recreation Therapy Notes  Date: 11.07.17 Time: 9:30 am Location: Craft Room  Group Topic: Goal Setting  Goal Area(s) Addresses:  Patient will write one goal. Patient will verbalize benefit of setting goals.  Behavioral Response: Did not attend  Intervention: Step By Step  Activity: Patients were given a worksheet with a foot on it and were instructed to write their goal inside the foot. On the outside of the foot, patients were instructed to write positive statements to help them to keep working on their goals.  Education: LRT educated patients on ways to celebrate once they have reached their goals.  Education Outcome: Patient did not attend group.  Clinical Observations/Feedback: Patient did not attend group.  Jacquelynn CreeGreene,Lalaine Overstreet M, LRT/CTRS 07/24/2016 10:13 AM

## 2016-07-25 MED ORDER — MELOXICAM 15 MG PO TABS: 15.0000 mg | ORAL_TABLET | Freq: Every day | ORAL | 0 refills | Status: DC

## 2016-07-25 MED ORDER — ARIPIPRAZOLE 20 MG PO TABS: 20.0000 mg | ORAL_TABLET | Freq: Every day | ORAL | 0 refills | Status: DC

## 2016-07-25 MED ORDER — PRAZOSIN HCL 2 MG PO CAPS: 2.0000 mg | ORAL_CAPSULE | Freq: Every day | ORAL | 0 refills | Status: DC

## 2016-07-25 MED ORDER — MIRTAZAPINE 7.5 MG PO TABS: 7.5000 mg | ORAL_TABLET | Freq: Every day | ORAL | 0 refills | Status: DC

## 2016-07-25 MED ORDER — VENLAFAXINE HCL ER 150 MG PO CP24: 150.0000 mg | ORAL_CAPSULE | Freq: Every day | ORAL | 0 refills | Status: DC

## 2016-07-25 NOTE — Progress Notes (Signed)
[facility]  07/25/2016 9:53 AM Mercedes Forbes  MRN:  [id number] Subjective:  45 year old woman with known history of mood disorder and substance abuse. She was in jail for several days this past week and was not given her medicine during that time. She claims that she either lost it or what it was not given back to her when she was discharged. She has not had any medicine then since the beginning of this last week. She got out of jail a couple days ago and has been back to using drugs heavily ever since. Admits to using cocaine and cannabis opiates and drinking. Mood is very depressed. Sleeping very poorly. Has had vague visual hallucinations at times. Active suicidal thoughts with thoughts of hanging herself.  "I just want to be dead". Denies homicidal ideation.   Today the patient reports feeling better. She does not feel as hopeless. She does not have suicidal ideation today. She slept better last night and did not have any nightmares. She is tolerating medications well. She denies any physical complaints or side effects. She says she has been eating well her energy is improving. She denies any auditory or visual hallucinations.    Per nursing: Blank slate affect, cold fixed eye contact but pleasant, observed in the day room with others, no abnormal behaviors, denied SI/HI, denied AV/H, medication compliant, able to understand that the Przosin (Minpress) was increased to 2 Capsule, hydroxyzine 25 mg tablet given per request.  Patient  voice of being nervous this shift . Came to Clinical research associatewriter  requesting  Medication  for  Her  nerves. A;ttending unit programing . Interacting with peers and staff . Appetite good  Patient stated slept poor last night .Stated appetite fair and energy level  Is low. Stated concentration is good . Stated on Depression scale 7 , hopeless 7   And  Anxiety 8 .( low 0-10 high) Denies suicidal  homicidal ideations  .  No auditory hallucinations  No pain concerns . Appropriate  ADL'S. Interacting with peers and staff. Voice of her focus  Is to attend groups   Principal Problem: Bipolar 2 disorder, major depressive episode (HCC) Diagnosis:   Patient Active Problem List   Diagnosis Date Noted  . 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 PTSD (post-traumatic stress disorder) [F43.10] 04/04/2016  . Tobacco use disorder [F17.200] 04/03/2016  . Bipolar 2 disorder, major depressive episode (HCC) [F31.81] 04/02/2016  . Cocaine use disorder, moderate, dependence (HCC) [F14.20] 04/02/2016  . Cannabis use disorder, moderate, dependence (HCC) [F12.20] 04/02/2016   Total Time spent with patient: 30 minutes  Past Psychiatric History: Long history of mental health problems variously diagnosed as bipolar 2 and PTSD in addition to substance abuse problems. Multiple prior suicide attempts. Has had prior us admissions most recently this summer. Was stable on a combination of medicines as noted in her previous admission.  Past Medical History: Multiple medical problems some of them related to substance abuse but nothing else specific requiring specific treatment. Some intermittent high blood pressure. Past Medical History:  Diagnosis Date  . Arthritis   . Bipolar 1 disorder (HCC)   . Personality disorder   . Schizophrenia [facility](HCC)     Past Surgical History:  Procedure Laterality Date  . JOINT REPLACEMENT     Family History: History reviewed. No pertinent family history.   Family Psychiatric  History: Patient reports that her mother had depression and her father was diagnosed with dementia.  Social History: Says that she does  have a place [place] Not working. Just out of jail a couple days.  Patient has been married 3 times. She has 3 children from her early marriage ages [age] and [age] and [age]. The patient did 3 years in college was pursuing an accounting degree. She does her last husband due to suicide 3 years ago. History  Alcohol  Use No     History  Drug Use  . Types: Cocaine    Social History   Social History  . Marital status: Widowed    Spouse name: N/A  . Number of children: N/A  . Years of education: N/A   Social History Main Topics  . Smoking status: Current Every Day Smoker    Packs/day: 1.00  . Smokeless tobacco: Never Used     Comment: Nicotine patch was ordered  . Alcohol use No  . Drug use:     Types: Cocaine  . Sexual activity: No   Other Topics Concern  . None   Social History Narrative  . None    Current Medications: Current Facility-Administered Medications  Medication Dose Route Frequency Provider Last Rate Last Dose  . acetaminophen (TYLENOL) tablet 1,000 mg  1,000 mg Oral Q6H PRN Jimmy FootmanAndrea Hernandez-Gonzalez, MD   1,000 mg at 07/23/16 2137  . alum & mag hydroxide-simeth (MAALOX/MYLANTA) 200-200-20 MG/5ML suspension 30 mL  30 mL Oral Q4H PRN Audery AmelJohn T Clapacs, MD      . ARIPiprazole (ABILIFY) tablet 20 mg  20 mg Oral Daily Jimmy FootmanAndrea Hernandez-Gonzalez, MD   20 mg at 07/25/16 0843  . hydrOXYzine (ATARAX/VISTARIL) tablet 25 mg  25 mg Oral Q4H PRN Jimmy FootmanAndrea Hernandez-Gonzalez, MD   25 mg at 07/25/16 0844  . magnesium hydroxide (MILK OF MAGNESIA) suspension 30 mL  30 mL Oral Daily PRN Audery AmelJohn T Clapacs, MD      . meloxicam (MOBIC) tablet 15 mg  15 mg Oral Daily Jimmy FootmanAndrea Hernandez-Gonzalez, MD   15 mg at 07/25/16 0843  . mirtazapine (REMERON) tablet 7.5 mg  7.5 mg Oral QHS Jimmy FootmanAndrea Hernandez-Gonzalez, MD   7.5 mg at 07/24/16 2133  . nicotine (NICODERM CQ - dosed in mg/24 hours) patch 21 mg  21 mg Transdermal Daily Audery AmelJohn T Clapacs, MD   21 mg at 07/25/16 0844  . prazosin (MINIPRESS) capsule 2 mg  2 mg Oral QHS Jimmy FootmanAndrea Hernandez-Gonzalez, MD   2 mg at 07/24/16 2133  . venlafaxine XR (EFFEXOR-XR) 24 hr capsule 150 mg  150 mg Oral Q breakfast Jimmy FootmanAndrea Hernandez-Gonzalez, MD   150 mg at 07/25/16 16100843    Lab Results:  No results found for this or any previous visit (from the past 48 hour(s)).  Blood Alcohol  level:  Lab Results  Component Value Date   [facility] 07/[facility]2016   ETH <5 04/02/2016    Metabolic Disorder Labs: Lab Results  Component Value Date   HGBA1C 5.3 07/23/2016   MPG 105 07/23/2016   Lab Results  Component Value Date   PROLACTIN 16.0 07/23/2016   PROLACTIN 12.0 04/03/2016   Lab Results  Component Value Date   CHOL 214 (H) 07/23/2016   TRIG 238 (H) 07/23/2016   HDL 40 (L) 07/23/2016   CHOLHDL 5.4 07/23/2016   VLDL 48 (H) 07/23/2016   LDLCALC 126 (H) 07/23/2016   LDLCALC 91 04/03/2016    Physical Findings: AIMS:  , ,  ,  ,    CIWA:    COWS:     Musculoskeletal: Strength &  Muscle Tone: within normal limits Gait & Station: normal Patient leans: N/A  Psychiatric Specialty Exam: Physical Exam  Constitutional: She is oriented to person, place, and time. She appears well-developed and well-nourished.  HENT:  Head: Normocephalic and atraumatic.  Eyes: Conjunctivae and EOM are normal.  Neck: Normal range of motion.  Respiratory: Effort normal.  Musculoskeletal: Normal range of motion.  Neurological: She is alert and oriented to person, place, and time.    Review of Systems  Constitutional: Negative.   HENT: Negative.   Eyes: Negative.   Respiratory: Negative.   Cardiovascular: Negative.   Gastrointestinal: Negative.   Genitourinary: Negative.   Musculoskeletal: Negative.   Skin: Negative.   Neurological: Negative.   Endo/Heme/Allergies: Negative.   Psychiatric/Behavioral: Positive for depression and substance abuse. Negative for suicidal ideas. The patient is nervous/anxious.     Blood pressure 109/80, pulse 92, temperature 98.1 F (36.7 C), temperature source Oral, resp. rate 20, height 5' 7.99" (1.727 m), weight 76.7 kg (169 lb), last menstrual period 07/15/2016, SpO2 98 %.Body mass index is 25.7 kg/m.  General Appearance: Well Groomed  Eye Contact:  Good  Speech:  Clear and Coherent  Volume:  Normal  Mood:  Dysphoric  Affect:  Blunt  Thought  Process:  Linear and Descriptions of Associations: Intact  Orientation:  Full (Time, Place, and Person)  Thought Content:  Hallucinations: Visual  Suicidal Thoughts:  No  Homicidal Thoughts:  No  Memory:  Immediate;   Good Recent;   Good Remote;   Good  Judgement:  Fair  Insight:  Lacking  Psychomotor Activity:  Decreased  Concentration:  Concentration: Fair and Attention Span: Fair  Recall:  Fair  Fund of Knowledge:  Good  Language:  Good  Akathisia:  No  Handed:    AIMS (if indicated):     Assets:  Communication Skills Physical Health  ADL's:  Intact  Cognition:  WNL  Sleep:  Number of Hours: 7     Treatment Plan Summary:  Bipolar disorder continue Effexor XR 150 mg by mouth daily and continue Abilify 20 mg a day. Patient appears to be improving not as hopeless. Denies suicidality.  Insomnia: continue mirtazapine 7.5 mg by mouth daily at bedtime.  Anxiety patient will be prescribed with Vistaril when necessary q 4 h Max 3 tabs per day  PTSD: continue prazosin 2 mg qhs  Arthritis:  continued on Mobic 15 mg by mouth daily  Tobacco use disorder patient has been started on a nicotine patch at 21 mg a day  Cocaine and cannabis use disorder: Patient is in need of intensive outpatient substance abuse treatment upon discharge  Disposition once stable she will be discharged back to her home  Follow she will continue to follow-up with [facility]. Patient has been referred to community support team however she only has Medicare. We will follow up with [facility] to see if she will be a legible for this service sometimes carting I will agree with paying for it. One of the problems with this patient is a lack of transportation to her appointments.  Per [facility] she will be able to start CST immediately after discharge  Plan to discharge tomorrow  [name], MD 07/25/2016, 9:53 AM

## 2016-07-25 NOTE — [facility] Group Notes (Signed)
[facility] Group Notes:  (Nursing/MHT/Case Management/Adjunct)  Date:  07/25/2016  Time:  3:57 PM  Type of Therapy:  Psychoeducational Skills  Participation Level:  Active  Participation Quality:  Appropriate, Sharing and Supportive  Affect:  Appropriate  Cognitive:  Appropriate  Insight:  Appropriate  Engagement in Group:  Engaged and Supportive  Modes of Intervention:  Discussion, Education and Support  Summary of Progress/Problems:  Mercedes PennerKristen J Lum Forbes 07/25/2016, 3:57 PM

## 2016-07-25 NOTE — Progress Notes (Signed)
Recreation Therapy Notes  Date: 11.08.17 Time: 9:30 am Location: Craft Room  Group Topic: Self-esteem  Goal Area(s) Addresses:  Patient will write at least one positive trait about self. Patient will verbalize benefit of having a healthy self-esteem.  Behavioral Response: Attentive, Interactive  Intervention: I Am  Activity: Patients were given a worksheet with the letter I on it and were instructed to write as many positive traits about themselves inside the letter.  Education: LRT educated patients on ways they can increase their self-esteem.  Education Outcome: Acknowledges education/In group clarification offered  Clinical Observations/Feedback: Patient wrote positive traits about self. Patient contributed to group discussion by stating it was easy to think of positive traits and why, that it is sometimes hard to think of positive traits and why, and how her self-esteem affects her.  Jacquelynn CreeGreene,[place]ene [place] M, LRT/CTRS 07/25/2016 11:55 AM

## 2016-07-25 NOTE — Plan of Care (Signed)
Problem: Safety: Goal: Periods of time without injury will increase Outcome: Progressing .Medications compliant; PRN given as requested, 15 minute checks maintained for safety, clinical and moral support provided, patient encouraged to continue to express feelings and demonstrate safe care. Patient remains free from harm, will continue to monitor.

## 2016-07-25 NOTE — [facility] Group Notes (Addendum)
[facility] Group Notes:  (Nursing/MHT/Case Management/Adjunct)  Date:  07/25/2016  Time:  4:55 AM  Type of Therapy:  Psychoeducational Skills  Participation Level:  Did Not Attend  Summary of Progress/Problems:  Chancy MilroyLaquanda Y Lacreasha Hinds 07/25/2016, 4:55 AM

## 2016-07-25 NOTE — Progress Notes (Signed)
Blank slate affect, cold fixed eye contact but pleasant, observed in the day room with others, no abnormal behaviors, denied SI/HI, denied AV/H, medication compliant, able to understand that the Przosin (Minpress) was increased to 2 Capsule, hydroxyzine 25 mg tablet given per request.

## 2016-07-25 NOTE — [facility] Group Notes (Signed)
[facility] LCSW Group Therapy Note  Date/Time:07/24/2016 1:00pm  Type of Therapy/Topic:  Group Therapy:  Feelings about Diagnosis  Participation Level:  Minimal  Mood: Good   Description of Group:    This group will allow patients to explore their thoughts and feelings about diagnoses they have received. Patients will be guided to explore their level of understanding and acceptance of these diagnoses. Facilitator will encourage patients to process their thoughts and feelings about the reactions of others to their diagnosis, and will guide patients in identifying ways to discuss their diagnosis with significant others in their lives. This group will be process-oriented, with patients participating in exploration of their own experiences as well as giving and receiving support and challenge from other group members.   Therapeutic Goals: 1. Patient will demonstrate understanding of diagnosis as evidence by identifying two or more symptoms of the disorder:  2. Patient will be able to express two feelings regarding the diagnosis 3. Patient will demonstrate ability to communicate their needs through discussion and/or role plays  Summary of Patient Progress: Pt able to achieve therapeutic goals.  Has some insight into her situation and expresses frustration with relapsing but also recognizes she has had progress in managing and learning to better control symptoms than when she was younger.  Therapeutic Modalities:   Cognitive Behavioral Therapy Brief Therapy Feelings Identification   Jake SharkSara Ender Rorke, MSW, LCSW

## 2016-07-25 NOTE — Tx Team (Signed)
Interdisciplinary Treatment and Diagnostic Plan Update  07/25/2016 Time of Session: 10:30 AM Mercedes Forbes MRN: [id number]  Principal Diagnosis: Bipolar 2 disorder, major depressive episode (HCC)  Secondary Diagnoses: Principal Problem:   Bipolar 2 disorder, major depressive episode (HCC) Active Problems:   Cocaine use disorder, moderate, dependence (HCC)   Cannabis use disorder, moderate, dependence (HCC)   Tobacco use disorder   PTSD (post-traumatic stress disorder)   Opiate abuse, episodic   Current Medications:  Current Facility-Administered Medications  Medication Dose Route Frequency Provider Last Rate Last Dose  . acetaminophen (TYLENOL) tablet 1,000 mg  1,000 mg Oral Q6H PRN [name], MD   1,000 mg at 07/23/16 2137  . alum & mag hydroxide-simeth (MAALOX/MYLANTA) 200-200-20 MG/5ML suspension 30 mL  30 mL Oral Q4H PRN Audery Amel, MD      . ARIPiprazole (ABILIFY) tablet 20 mg  20 mg Oral Daily [name], MD   20 mg at 07/25/16 0843  . hydrOXYzine (ATARAX/VISTARIL) tablet 25 mg  25 mg Oral Q4H PRN [name], MD   25 mg at 07/25/16 0844  . magnesium hydroxide (MILK OF MAGNESIA) suspension 30 mL  30 mL Oral Daily PRN Audery Amel, MD      . meloxicam (MOBIC) tablet 15 mg  15 mg Oral Daily [name], MD   15 mg at 07/25/16 0843  . mirtazapine (REMERON) tablet 7.5 mg  7.5 mg Oral QHS [name], MD   7.5 mg at 07/24/16 2133  . nicotine (NICODERM CQ - dosed in mg/24 hours) patch 21 mg  21 mg Transdermal Daily Audery Amel, MD   21 mg at 07/25/16 0844  . prazosin (MINIPRESS) capsule 2 mg  2 mg Oral QHS [name], MD   2 mg at 07/24/16 2133  . venlafaxine XR (EFFEXOR-XR) 24 hr capsule 150 mg  150 mg Oral Q breakfast [name], MD   150 mg at 07/25/16 9562   PTA Medications: Prescriptions Prior to Admission  Medication Sig Dispense Refill Last Dose  . ARIPiprazole  (ABILIFY) 20 MG tablet Take 1 tablet (20 mg total) by mouth daily. 30 tablet 0   . clonazePAM (KLONOPIN) 1 MG tablet Take 1 mg by mouth 2 (two) times daily.  3 12/26/2014 at Unknown time  . cloNIDine (CATAPRES) 0.1 MG tablet Take 0.1 mg by mouth at bedtime.  3 12/25/2014 at Unknown time  . meloxicam (MOBIC) 15 MG tablet Take 1 tablet (15 mg total) by mouth daily. 30 tablet 0   . Multiple Vitamins-Minerals (MULTIVITAMIN ADULT PO) Take 1 tablet by mouth daily.   12/26/2014 at Unknown time  . prazosin (MINIPRESS) 2 MG capsule Take 1 capsule (2 mg total) by mouth 2 (two) times daily. 60 capsule 0   . venlafaxine XR (EFFEXOR-XR) 150 MG 24 hr capsule Take 1 capsule (150 mg total) by mouth daily with breakfast. 30 capsule 0   . zolpidem (AMBIEN) 10 MG tablet Take 10 mg by mouth at bedtime.  3 12/25/2014 at Unknown time    Patient Stressors: Financial difficulties Other: Difficulty finding transportation  Patient Strengths: Capable of independent living Barrister's clerk for treatment/growth Religious Affiliation  Treatment Modalities: Medication Management, Group therapy, Case management,  1 to 1 session with clinician, Psychoeducation, Recreational therapy.   Physician Treatment Plan for Primary Diagnosis: Bipolar 2 disorder, major depressive episode (HCC) Long Term Goal(s): Improvement in symptoms so as ready for discharge Improvement in symptoms so as ready for discharge   Short Term  Goals: Ability to identify changes in lifestyle to reduce recurrence of condition will improve Ability to verbalize feelings will improve Ability to disclose and discuss suicidal ideas Ability to demonstrate self-control will improve Ability to identify and develop effective coping behaviors will improve Compliance with prescribed medications will improve Ability to identify triggers associated with substance abuse/mental health issues will improve Ability to identify changes in lifestyle to reduce  recurrence of condition will improve Ability to identify and develop effective coping behaviors will improve Ability to identify triggers associated with substance abuse/mental health issues will improve  Medication Management: Evaluate patient's response, side effects, and tolerance of medication regimen.  Therapeutic Interventions: 1 to 1 sessions, Unit Group sessions and Medication administration.  Evaluation of Outcomes: Progressing  Physician Treatment Plan for Secondary Diagnosis: Principal Problem:   Bipolar 2 disorder, major depressive episode (HCC) Active Problems:   Cocaine use disorder, moderate, dependence (HCC)   Cannabis use disorder, moderate, dependence (HCC)   Tobacco use disorder   PTSD (post-traumatic stress disorder)   Opiate abuse, episodic  Long Term Goal(s): Improvement in symptoms so as ready for discharge Improvement in symptoms so as ready for discharge   Short Term Goals: Ability to identify changes in lifestyle to reduce recurrence of condition will improve Ability to verbalize feelings will improve Ability to disclose and discuss suicidal ideas Ability to demonstrate self-control will improve Ability to identify and develop effective coping behaviors will improve Compliance with prescribed medications will improve Ability to identify triggers associated with substance abuse/mental health issues will improve Ability to identify changes in lifestyle to reduce recurrence of condition will improve Ability to identify and develop effective coping behaviors will improve Ability to identify triggers associated with substance abuse/mental health issues will improve     Medication Management: Evaluate patient's response, side effects, and tolerance of medication regimen.  Therapeutic Interventions: 1 to 1 sessions, Unit Group sessions and Medication administration.  Evaluation of Outcomes: Progressing   RN Treatment Plan for Primary Diagnosis: Bipolar 2  disorder, major depressive episode (HCC) Long Term Goal(s): Knowledge of disease and therapeutic regimen to maintain health will improve  Short Term Goals: Ability to remain free from injury will improve, Ability to participate in decision making will improve, Ability to identify and develop effective coping behaviors will improve and Compliance with prescribed medications will improve  Medication Management: RN will administer medications as ordered by provider, will assess and evaluate patient's response and provide education to patient for prescribed medication. RN will report any adverse and/or side effects to prescribing provider.  Therapeutic Interventions: 1 on 1 counseling sessions, Psychoeducation, Medication administration, Evaluate responses to treatment, Monitor vital signs and CBGs as ordered, Perform/monitor CIWA, COWS, AIMS and Fall Risk screenings as ordered, Perform wound care treatments as ordered.  Evaluation of Outcomes: Progressing   LCSW Treatment Plan for Primary Diagnosis: Bipolar 2 disorder, major depressive episode (HCC) Long Term Goal(s): Safe transition to appropriate next level of care at discharge, Engage patient in therapeutic group addressing interpersonal concerns.  Short Term Goals: Engage patient in aftercare planning with referrals and resources, Increase social support, Increase emotional regulation, Facilitate patient progression through stages of change regarding substance use diagnoses and concerns, Identify triggers associated with mental health/substance abuse issues and Increase skills for wellness and recovery  Therapeutic Interventions: Assess for all discharge needs, 1 to 1 time with Social worker, Explore available resources and support systems, Assess for adequacy in community support network, Educate family and significant other(s) on suicide prevention, Complete  Psychosocial Assessment, Interpersonal group therapy.  Evaluation of Outcomes:  Progressing   Progress in Treatment: Attending groups: Yes. Participating in groups: Yes. Taking medication as prescribed: Yes. Toleration medication: Yes. Family/Significant other contact made: No, pt refused family contact. Patient understands diagnosis: Yes. Discussing patient identified problems/goals with staff: Yes. Medical problems stabilized or resolved: Yes. Denies suicidal/homicidal ideation: Yes. Issues/concerns per patient self-inventory: No.   New problem(s) identified: No, Describe:  NONE.  New Short Term/Long Term Goal(s):  Discharge Plan or Barriers:   Reason for Continuation of Hospitalization: Anxiety Depression Suicidal ideation Withdrawal symptoms  Estimated Length of Stay: D/C 07/26/2016  Attendees: Patient: Mercedes Forbes  07/25/2016 10:48 AM  Physician: Dr. Radene JourneyAndrea Hernandez 07/25/2016 10:48 AM  Nursing: Hulan AmatoGwen Farrish 07/25/2016 10:48 AM  RN Care Manager: 07/25/2016 10:48 AM  Social Worker: Hampton AbbotKadijah Ladana Chavero, MSW, LCSW-A 07/25/2016 10:48 AM  Recreational Therapist: Hershal CoriaBeth Greene, LRT, CTRS 07/25/2016 10:48 AM    Scribe for Treatment Team: Lynden OxfordKadijah R Amontae Ng, LCSWA 07/25/2016 10:48 AM

## 2016-07-25 NOTE — Plan of Care (Signed)
Problem: Coping: Goal: Ability to cope will improve Outcome: Progressing Continue  To work on coping skills , hand out given

## 2016-07-26 NOTE — [facility] Group Notes (Signed)
[facility] LCSW Group Therapy Note  Type of Therapy and Topic:  Group Therapy:  Goals Group: SMART Goals  Participation Level:  Patient attended group but did not participate during group.   Description of Group:   The purpose of a daily goals group is to assist and guide patients in setting recovery/wellness-related goals.  The objective is to set goals as they relate to the crisis in which they were admitted. Patients will be using SMART goal modalities to set measurable goals.  Characteristics of realistic goals will be discussed and patients will be assisted in setting and processing how one will reach their goal. Facilitator will also assist patients in applying interventions and coping skills learned in psycho-education groups to the SMART goal and process how one will achieve defined goal.  Therapeutic Goals: -Patients will develop and document one goal related to or their crisis in which brought them into treatment. -Patients will be guided by LCSW using SMART goal setting modality in how to set a measurable, attainable, realistic and time sensitive goal.  -Patients will process barriers in reaching goal. -Patients will process interventions in how to overcome and successful in reaching goal.   Summary of Patient Progress:  Patient Goal: Patient did not state her goal during group.    Therapeutic Modalities:   Motivational Interviewing Engineer, manufacturing systemsCognitive Behavioral Therapy Crisis Intervention Model SMART goals setting  Mercedes Hutmacher G. Garnette CzechSampson MSW, [facility] CenterCSWA 07/26/2016 10:46 AM
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
## 2016-07-26 NOTE — [facility] Group Notes (Signed)
[facility] LCSW Group Therapy  07/26/2016 11:14 AM  Type of Therapy:  Group Therapy  Participation Level:  Active  Participation Quality:  Appropriate  Affect:  Appropriate  Cognitive:  Alert  Insight:  Engaged  Engagement in Therapy:  Engaged  Modes of Intervention:  Activity, Discussion, Education and Support  Summary of Progress/Problems:Balance in life: Patients will discuss the concept of balance and how it looks and feels to be unbalanced. Pt will identify areas in their life that is unbalanced and ways to become more balanced. Patient stated she looked forward to working with her CST team and her outpatient provider with [facility].    Shela Esses G. Garnette CzechSampson MSW, LCSWA 07/26/2016, 11:16 AM

## 2016-07-26 NOTE — Progress Notes (Signed)
Patient discharged home. DC instructions provided and explained. Medications reviewed. Rx given. All questions answered. Pt stable at discharge. Denies SI, HI, AVH. Belongings returned. 

## 2016-07-26 NOTE — Plan of Care (Signed)
Problem: Safety: Goal: Periods of time without injury will increase Outcome: Progressing No periods of injury reported, observed during this shift; 15 minute checks maintained for safety, clinical and moral support provided, patient encouraged to continue to express feelings and demonstrate safe care. Patient remains free from harm, will continue to monitor.

## 2016-07-26 NOTE — Progress Notes (Signed)
Subtle involuntary movements of the mouth and jaws noted, mood, affect and appearance much better; patient denied SI/HI, denied AV/H, states that her pain is well managed by Mobic tablet 15 mg that she receives daily and declined Tylenol as offered, she received the 3rd hydroxyzine at 2037 and compliant with bedtime medications.

## 2016-07-26 NOTE — [facility] Group Notes (Signed)
[facility] Group Notes:  (Nursing/MHT/Case Management/Adjunct)  Date:  07/26/2016  Time:  3:50 AM  Type of Therapy:  Psychoeducational Skills  Participation Level:  Active  Participation Quality:  Appropriate and Sharing  Affect:  Appropriate  Cognitive:  Appropriate  Insight:  Appropriate, Good and Improving  Engagement in Group:  Engaged  Modes of Intervention:  Discussion, Socialization and Support  Summary of Progress/Problems:  Chancy MilroyLaquanda Y Chima Astorino 07/26/2016, 3:50 AM

## 2016-07-26 NOTE — Progress Notes (Signed)
  [facility] :  Will you be returning to the same living situation after discharge:  Yes,  pt will return home. At discharge, do you have transportation home?: Yes,  friends will pick pt up. Do you have the ability to pay for your medications: Yes,  Medicaid/Medicare.  Release of information consent forms completed and in the chart;  Patient's signature needed at discharge.  Patient to Follow up at: Follow-up Information    [facility]. Go on 07/27/2016.   Why:  Please follow-up with Unk PintoHarvey Bryant at [phone] for your CST meeting with [facility]. It is important that you contact Lorella NimrodHarvey as soon as you discharge to make sure everything is taken care of. Please follow up with Dr. Marguerite OleaMoffett for med mgt. Contact information: Address: [address], [place], [place] [address] Phone: [phone] Fax: [phone]          Next level of care provider has [facility] Link:no  Safety Planning and Suicide Prevention discussed: Yes,  completed with patient.  Have you used any form of tobacco in the last 30 days? (Cigarettes, Smokeless Tobacco, Cigars, and/or Pipes): Yes  Has patient been referred to the Quitline?: Patient refused referral  Patient has been referred for addiction treatment: Pt. refused referral  Lynden OxfordKadijah R Ebert Forrester, MSW, LCSW-A 07/26/2016, 9:19 AM

## 2016-08-03 ENCOUNTER — Emergency Department
Admission: EM | Admit: 2016-08-03 | Discharge: 2016-08-03 | Disposition: A | Discharge status: Home / Self Care | Payer: Medicare Other | Attending: Emergency Medicine | Admitting: Emergency Medicine

## 2016-08-03 DIAGNOSIS — F1410 Cocaine abuse, uncomplicated: Secondary | ICD-10-CM | POA: Insufficient documentation

## 2016-08-03 DIAGNOSIS — F259 Schizoaffective disorder, unspecified: Secondary | ICD-10-CM | POA: Diagnosis not present

## 2016-08-03 DIAGNOSIS — F1220 Cannabis dependence, uncomplicated: Secondary | ICD-10-CM | POA: Diagnosis present

## 2016-08-03 DIAGNOSIS — F250 Schizoaffective disorder, bipolar type: Secondary | ICD-10-CM | POA: Diagnosis not present

## 2016-08-03 DIAGNOSIS — R440 Auditory hallucinations: Secondary | ICD-10-CM | POA: Diagnosis present

## 2016-08-03 DIAGNOSIS — F1420 Cocaine dependence, uncomplicated: Secondary | ICD-10-CM | POA: Diagnosis present

## 2016-08-03 DIAGNOSIS — F17200 Nicotine dependence, unspecified, uncomplicated: Secondary | ICD-10-CM | POA: Diagnosis not present

## 2016-08-03 DIAGNOSIS — F4310 Post-traumatic stress disorder, unspecified: Secondary | ICD-10-CM | POA: Diagnosis present

## 2016-08-03 DIAGNOSIS — Z79899 Other long term (current) drug therapy: Secondary | ICD-10-CM | POA: Insufficient documentation

## 2016-08-03 LAB — COMPREHENSIVE METABOLIC PANEL
ALT: 24 U/L (ref 14–54)
ANION GAP: 8 (ref 5–15)
AST: 33 U/L (ref 15–41)
Albumin: 4.2 g/dL (ref 3.5–5.0)
Alkaline Phosphatase: 97 U/L (ref 38–126)
BUN: 11 mg/dL (ref 6–20)
CHLORIDE: 103 mmol/L (ref 101–111)
CO2: 24 mmol/L (ref 22–32)
CREATININE: 0.97 mg/dL (ref 0.44–1.00)
Calcium: 9 mg/dL (ref 8.9–10.3)
Glucose, Bld: 109 mg/dL — ABNORMAL HIGH (ref 65–99)
POTASSIUM: 3.3 mmol/L — AB (ref 3.5–5.1)
SODIUM: 135 mmol/L (ref 135–145)
Total Bilirubin: 0.6 mg/dL (ref 0.3–1.2)
Total Protein: 7.6 g/dL (ref 6.5–8.1)

## 2016-08-03 LAB — CBC
HCT: 36.4 % (ref 35.0–47.0)
Hemoglobin: 12.8 g/dL (ref 12.0–16.0)
MCH: 32.1 pg (ref 26.0–34.0)
MCHC: 35.1 g/dL (ref 32.0–36.0)
MCV: 91.4 fL (ref 80.0–100.0)
PLATELETS: 234 10*3/uL (ref 150–440)
RBC: 3.98 MIL/uL (ref 3.80–5.20)
RDW: 13.6 % (ref 11.5–14.5)
WBC: 13.2 10*3/uL — ABNORMAL HIGH (ref 3.6–11.0)

## 2016-08-03 LAB — URINE DRUG SCREEN, QUALITATIVE (ARMC ONLY)
AMPHETAMINES, UR SCREEN: NOT DETECTED
BENZODIAZEPINE, UR SCRN: POSITIVE — AB
Barbiturates, Ur Screen: NOT DETECTED
CANNABINOID 50 NG, UR ~~LOC~~: POSITIVE — AB
Cocaine Metabolite,Ur ~~LOC~~: POSITIVE — AB
MDMA (Ecstasy)Ur Screen: NOT DETECTED
Methadone Scn, Ur: NOT DETECTED
OPIATE, UR SCREEN: POSITIVE — AB
PHENCYCLIDINE (PCP) UR S: NOT DETECTED
Tricyclic, Ur Screen: POSITIVE — AB

## 2016-08-03 LAB — URINALYSIS COMPLETE WITH MICROSCOPIC (ARMC ONLY)
BILIRUBIN URINE: NEGATIVE
Bacteria, UA: NONE SEEN
Glucose, UA: NEGATIVE mg/dL
Hgb urine dipstick: NEGATIVE
KETONES UR: NEGATIVE mg/dL
LEUKOCYTES UA: NEGATIVE
NITRITE: NEGATIVE
PH: 7 (ref 5.0–8.0)
PROTEIN: 30 mg/dL — AB
SPECIFIC GRAVITY, URINE: 1.021 (ref 1.005–1.030)

## 2016-08-03 LAB — PREGNANCY, URINE: Preg Test, Ur: NEGATIVE

## 2016-08-03 LAB — ETHANOL

## 2016-08-03 LAB — POCT PREGNANCY, URINE: Preg Test, Ur: NEGATIVE

## 2016-08-03 MED ORDER — HYDROXYZINE PAMOATE 25 MG PO CAPS: 25.0000 mg | ORAL_CAPSULE | Freq: Three times a day (TID) | ORAL | 0 refills | Status: DC | PRN

## 2016-08-03 MED ORDER — ACETAMINOPHEN 500 MG PO TABS
1000.0000 mg | ORAL_TABLET | ORAL | Status: AC
  Administered 2016-08-03: 1000 mg via ORAL
  Filled 2016-08-03: qty 2

## 2016-08-03 NOTE — ED Notes (Signed)
Patient has set of clothes in bag, bookbag ( consists of clothes and pocketbook), one wedding one, 2 necklaces, 2 hairbows, pack of newports,a  lighter, a cell phone, 3 bottles of medication (1-prazosin,1-venlafaxine,1-aripiprazole)

## 2016-08-03 NOTE — Progress Notes (Signed)
LCSW and  Dr Toni Amendlapacs consulted and patient is to be discharged and follow up  [facility]. LCSW provided [facility] handout and Administrator, Civil Servicecommunity resource list.  Arrie SenateClaudine Shigeru Lampert LCSW [phone]
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
## 2016-08-03 NOTE — ED Provider Notes (Signed)
-----------------------------------------   3:55 PM on 08/03/2016 -----------------------------------------   BP 112/66 (BP Location: Left Arm)   Pulse 78   Temp 98.1 F (36.7 C) (Oral)   Resp 18   Ht 5\' 8"  (1.727 m)   Wt 72.6 kg   LMP 07/15/2016   SpO2 97%   BMI 24.33 kg/m   I spoke in person with Dr. Toni Amendlapacs who evaluated the patient and feels that the patient is safe to be discharged with outpatient follow-up.  The patient is hemodynamically stable and appropriate to go at this time.    [name], MD 08/03/16 [phone]

## 2016-08-03 NOTE — ED Triage Notes (Addendum)
Pt was just discharged from [facility] and states is still having hallucinations and feeling suicidal. Pt has been using crack today.

## 2016-08-03 NOTE — Discharge Instructions (Signed)

## 2016-08-03 NOTE — ED Notes (Signed)
Patient unable to void at this time

## 2016-08-03 NOTE — ED Notes (Signed)
Breakfast was given to patient. 

## 2016-08-03 NOTE — ED Provider Notes (Signed)
[facility] Emergency Department Provider Note   ____________________________________________   First MD Initiated Contact with Patient 08/03/16 [phone]     (approximate)  I have reviewed the triage vital signs and the nursing notes.   HISTORY  Chief Complaint Hallucinations    HPI Mercedes Forbes is a 45 y.o. female for evaluation as she reports ongoing "arguing" in her head. She's hearing voices that seemed to be "arguing back and forth". For the last several days. She reports that they're similar to when she was admitted to the hospital last. She denies any suicidal ideation, she denies any plan to harm herself or anyone else. She does feel that she sometimes has thoughts of hurting herself, but no real plan to commit suicide.  Denies nausea or vomiting. Has had a recent "cold" with some mild sinus pressure. No headaches or neck pain chest pain or shortness of breath. Denies cough. No abdominal pain nausea vomiting. No pain or burning with urination   Past Medical History:  Diagnosis Date  . Arthritis   . Bipolar 1 disorder (HCC)   . Personality disorder   . Schizophrenia [facility])     Patient Active Problem List   Diagnosis Date Noted  . Schizoaffective disorder (HCC) 08/03/2016  . Opiate abuse, episodic 07/22/2016  . PTSD (post-traumatic stress disorder) 04/04/2016  . Tobacco use disorder 04/03/2016  . Bipolar 2 disorder, major depressive episode (HCC) 04/02/2016  . Cocaine use disorder, moderate, dependence (HCC) 04/02/2016  . Cannabis use disorder, moderate, dependence (HCC) 04/02/2016    Past Surgical History:  Procedure Laterality Date  . JOINT REPLACEMENT      Prior to Admission medications   Medication Sig Start Date End Date Taking? Authorizing Provider  ARIPiprazole (ABILIFY) 20 MG tablet Take 1 tablet (20 mg total) by mouth daily. 07/26/16   [name], MD  hydrOXYzine (VISTARIL) 25 MG capsule Take 1 capsule (25 mg  total) by mouth 3 (three) times daily as needed. 08/03/16   Audery Amel, MD  meloxicam (MOBIC) 15 MG tablet Take 1 tablet (15 mg total) by mouth daily. 07/26/16   [name], MD  mirtazapine (REMERON) 7.5 MG tablet Take 1 tablet (7.5 mg total) by mouth at bedtime. 07/25/16   [name], MD  prazosin (MINIPRESS) 2 MG capsule Take 1 capsule (2 mg total) by mouth at bedtime. Patient taking differently: Take 2 mg by mouth 2 (two) times daily.  07/25/16   [name], MD  venlafaxine XR (EFFEXOR-XR) 150 MG 24 hr capsule Take 1 capsule (150 mg total) by mouth daily with breakfast. 07/26/16   [name], MD    Allergies Aspirin  No family history on file.  Social History Social History  Substance Use Topics  . Smoking status: Current Every Day Smoker    Packs/day: 1.00  . Smokeless tobacco: Never Used     Comment: Nicotine patch was ordered  . Alcohol use No    Review of Systems Constitutional: No fever/chills. See history of present illness Eyes: No visual changes. ENT: No sore throat. Cardiovascular: Denies chest pain. Respiratory: Denies shortness of breath. Gastrointestinal: No abdominal pain.  No nausea, no vomiting.  No diarrhea.  No constipation. Genitourinary: Negative for dysuria. Musculoskeletal: Negative for back pain. Skin: Negative for rash. Neurological: Negative for headaches, focal weakness or numbness.  10-point ROS otherwise negative.  ____________________________________________   PHYSICAL EXAM:  VITAL SIGNS: ED Triage Vitals  Enc Vitals Group     BP 08/03/16 0643 110/69  Pulse Rate 08/03/16 0643 (!) 101     Resp 08/03/16 0643 20     Temp 08/03/16 0643 98.5 F (36.9 C)     Temp Source 08/03/16 0643 Oral     SpO2 08/03/16 0643 97 %     Weight 08/03/16 0644 160 lb (72.6 kg)     Height 08/03/16 0644 5\' 8"  (1.727 m)     Head Circumference --      Peak Flow --      Pain Score 08/03/16 0644 9      Pain Loc --      Pain Edu? --      Excl. in GC? --     Constitutional: Alert and oriented. Well appearing and in no acute distress. 8 entire breakfast meal. Eyes: Conjunctivae are normal. PERRL. EOMI. Head: Atraumatic. Nose: No congestion/rhinnorhea. Mouth/Throat: Mucous membranes are moist.  Oropharynx non-erythematous. Neck: No stridor.   Cardiovascular: Normal rate, regular rhythm. Grossly normal heart sounds.  Good peripheral circulation. Respiratory: Normal respiratory effort.  No retractions. Lungs CTAB. Gastrointestinal: Soft and nontender.  Musculoskeletal: No lower extremity tenderness nor edema.  Neurologic:  Normal speech and language. No gross focal neurologic deficits are appreciated. Skin:  Skin is warm, dry and intact. No rash noted. Psychiatric: Mood and affect are normal. Speech and behavior are normal.  ____________________________________________   LABS (all labs ordered are listed, but only abnormal results are displayed)  Labs Reviewed  CBC - Abnormal; Notable for the following:       Result Value   WBC 13.2 (*)    All other components within normal limits  COMPREHENSIVE METABOLIC PANEL - Abnormal; Notable for the following:    Potassium 3.3 (*)    Glucose, Bld 109 (*)    All other components within normal limits  URINALYSIS COMPLETEWITH MICROSCOPIC ([facility] ONLY) - Abnormal; Notable for the following:    Color, Urine YELLOW (*)    APPearance HAZY (*)    Protein, ur 30 (*)    Squamous Epithelial / LPF 6-30 (*)    All other components within normal limits  URINE DRUG SCREEN, QUALITATIVE ([facility] ONLY) - Abnormal; Notable for the following:    Tricyclic, Ur Screen POSITIVE (*)    Cocaine Metabolite,Ur [place] POSITIVE (*)    Opiate, Ur Screen POSITIVE (*)    Cannabinoid 50 Ng, Ur [place] POSITIVE (*)    Benzodiazepine, Ur Scrn POSITIVE (*)    All other components within normal limits  ETHANOL  PREGNANCY, URINE  POC URINE PREG, ED  POCT PREGNANCY, URINE    ____________________________________________  EKG   ____________________________________________  RADIOLOGY   ____________________________________________   PROCEDURES  Procedure(s) performed: None  Procedures  Critical Care performed: No  ____________________________________________   INITIAL IMPRESSION / ASSESSMENT AND PLAN / ED COURSE  Pertinent labs & imaging results that were available during my care of the patient were reviewed by me and considered in my medical decision making (see chart for details).  Patient reports ongoing auditory hallucinations. Has a established history of psychiatric disease, the present time she presents voluntarily and is in agreement with me to contract for safety and psychiatric evaluation today while at our ER. As she is compliant, denies any acute suicidality I will have her remain voluntary at this time. She is very calm, appropriate, and in place consultation for her.  Denies any acute major medical condition, slight leukocytosis is noted but the patient does also report a mild stuffy nose for the last few days. No  evidence support a severe sinus type infection at this time.  She is awake alert and appropriate. Disposition based on psychiatric recommendations.  Clinical Course as of Aug 05 913  Caleen EssexFri Aug 03, 2016  1039 Patient resting comfortably in a hallway. No distress.  [MQ]    Clinical Course User Index [MQ] Sharyn CreamerMark Kanoe Wanner, MD     ____________________________________________   FINAL CLINICAL IMPRESSION(S) / ED DIAGNOSES  Final diagnoses:  Schizoaffective disorder, unspecified type (HCC)  Cocaine abuse      NEW MEDICATIONS STARTED DURING THIS VISIT:  Discharge Medication List as of 08/03/2016  3:56 PM    START taking these medications   Details  hydrOXYzine (VISTARIL) 25 MG capsule Take 1 capsule (25 mg total) by mouth 3 (three) times daily as needed., Starting Fri 08/03/2016, Print         Note:  This  document was prepared using Dragon voice recognition software and may include unintentional dictation errors.     Sharyn CreamerMark Velera [place], MD 08/05/16 [phone]

## 2016-08-03 NOTE — Consult Note (Signed)
[facility] Face-to-Face Psychiatry Consult   Reason for Consult:  Consult for 45 year old woman presented voluntarily to the emergency room because of some worsening symptoms over the past couple of days Referring Physician:  Karma Greaser Patient Identification: Mercedes Forbes MRN:  [id number] Principal Diagnosis: Schizoaffective disorder [facility]) Diagnosis:   Patient Active Problem List   Diagnosis Date Noted  . Schizoaffective disorder ([place]) [F25.9] 08/03/2016  . 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 PTSD (post-traumatic stress disorder) [F43.10] 04/04/2016  . Tobacco use disorder [F17.200] 04/03/2016  . Bipolar 2 disorder, major depressive episode ([place]) [F31.81] 04/02/2016  . Cocaine use disorder, moderate, dependence ([place]) [F14.20] 04/02/2016  . Cannabis use disorder, moderate, dependence ([place]) [F12.20] 04/02/2016    Total Time spent with patient: 1 hour  Subjective:   Mercedes Forbes is a 45 y.o. female patient admitted with "I was having hallucinations".  HPI:  Patient interviewed chart reviewed. 45 year old woman who was just discharged from the hospital about a week ago. Presented last night to the emergency room saying she was having hallucinations. She says for the past 2 days her symptoms of been worse. Claims to of been hearing things and seeing shadows and other things. Patient says she has been compliant with the medicine she was prescribed at discharge. She denies that she's been drinking but admits that she has used some cocaine. She was feeling better when she is was discharged from the hospital but just felt bad the last couple days. Patient had talked about having some suicidal ideation but has no intention or plan. She is able to agree that she is not likely to harm herself outside the hospital. No new stresses are identified.  Social history: She is living with some roommates. Does not work outside the home. Minimal contact with her family. Has a history of repeated jail  time  Medical history: No significant medical problems outside of the psychiatric  Substance abuse history: Well-established history of cocaine and cannabis abuse as her main problems.   Past Psychiatric History: Patient has a history of presenting with a variety of symptoms including anxiety hallucinations and mood symptoms. She has made some suicidal gestures in the past. She has been treated with a variety of medicines including Depakote and lithium Haldol and her current Effexor and Abilify. She was just discharged from the hospital a few days ago and is supposed to be following up with [facility]. She was given a primary diagnosis of PTSD in the hospital although it's a little hard to tease out all of her symptoms given her substance abuse problems  Risk to Self: Suicidal Ideation: Yes-Currently Present Suicidal Intent: No Is patient at risk for suicide?: No Suicidal Plan?: No Access to Means: No What has been your use of drugs/alcohol within the last 12 months?: Cocaine & Cannabis How many times?: 0 Other Self Harm Risks: None Triggers for Past Attempts: None known Intentional Self Injurious Behavior: None Risk to Others: Homicidal Ideation: No Thoughts of Harm to Others: No Current Homicidal Intent: No Current Homicidal Plan: No Access to Homicidal Means: No Identified Victim: Reports of none History of harm to others?: No Assessment of Violence: None Noted Violent Behavior Description: Reports of none  Does patient have access to weapons?: No Criminal Charges Pending?: No Does patient have a court date: Yes Court Date: 09/14/16 Prior Inpatient Therapy: Prior Inpatient Therapy: Yes Prior Therapy Dates: 07/2016 & 03/2016 Prior Therapy Facilty/Provider(s): [facility] Reason for Treatment: Depression & SA Prior Outpatient Therapy: Prior Outpatient Therapy:  Yes Prior Therapy Dates: Current Prior Therapy Facilty/Provider(s): [facility] Reason for Treatment: Depression & SA Does patient have  an ACCT team?: No Does patient have Intensive In-House Services?  : No Does patient have [facility] services? : No Does patient have P4CC services?: No  Past Medical History:  Past Medical History:  Diagnosis Date  . Arthritis   . Bipolar 1 disorder ([place])   . Personality disorder   . Schizophrenia [facility])     Past Surgical History:  Procedure Laterality Date  . JOINT REPLACEMENT     Family History: No family history on file. Family Psychiatric  History: No identified family history Social History:  History  Alcohol Use No     History  Drug Use  . Types: Cocaine    Social History   Social History  . Marital status: Widowed    Spouse name: N/A  . Number of children: N/A  . Years of education: N/A   Social History Main Topics  . Smoking status: Current Every Day Smoker    Packs/day: 1.00  . Smokeless tobacco: Never Used     Comment: Nicotine patch was ordered  . Alcohol use No  . Drug use:     Types: Cocaine  . Sexual activity: No   Other Topics Concern  . Not on file   Social History Narrative  . No narrative on file   Additional Social History:    Allergies:   Allergies  Allergen Reactions  . Aspirin Other (See Comments)    Shortness of breath like an asthma attack    Labs:  Results for orders placed or performed during the hospital encounter of 08/03/16 (from the past 48 hour(s))  CBC     Status: Abnormal   Collection Time: 08/03/16  6:49 AM  Result Value Ref Range   WBC 13.2 (H) 3.6 - 11.0 K/uL   RBC 3.98 3.80 - 5.20 MIL/uL   Hemoglobin 12.8 12.0 - 16.0 g/dL   HCT 36.4 35.0 - 47.0 %   MCV 91.4 80.0 - 100.0 fL   MCH 32.1 26.0 - 34.0 pg   MCHC 35.1 32.0 - 36.0 g/dL   RDW 13.6 11.5 - 14.5 %   Platelets 234 150 - 440 K/uL  Comprehensive metabolic panel     Status: Abnormal   Collection Time: 08/03/16  6:49 AM  Result Value Ref Range   Sodium 135 135 - 145 mmol/L   Potassium 3.3 (L) 3.5 - 5.1 mmol/L   Chloride 103 101 - 111 mmol/L   CO2 24 22 -  32 mmol/L   Glucose, Bld 109 (H) 65 - 99 mg/dL   BUN 11 6 - 20 mg/dL   Creatinine, Ser 0.97 0.44 - 1.00 mg/dL   Calcium 9.0 8.9 - 10.3 mg/dL   Total Protein 7.6 6.5 - 8.1 g/dL   Albumin 4.2 3.5 - 5.0 g/dL   AST 33 15 - 41 U/L   ALT 24 14 - 54 U/L   Alkaline Phosphatase 97 38 - 126 U/L   Total Bilirubin 0.6 0.3 - 1.2 mg/dL   GFR calc non Af Amer >60 >60 mL/min   GFR calc Af Amer >60 >60 mL/min    Comment: (NOTE) The eGFR has been calculated using the CKD EPI equation. This calculation has not been validated in all clinical situations. eGFR's persistently <60 mL/min signify possible Chronic Kidney Disease.    Anion gap 8 5 - 15  Ethanol     Status: None   Collection  Time: 08/03/16  6:49 AM  Result Value Ref Range   Alcohol, Ethyl (B) <5 <5 mg/dL    Comment:        LOWEST DETECTABLE LIMIT FOR SERUM ALCOHOL IS 5 mg/dL FOR MEDICAL PURPOSES ONLY   Urinalysis complete, with microscopic ([facility] only)     Status: Abnormal   Collection Time: 08/03/16  2:33 PM  Result Value Ref Range   Color, Urine YELLOW (A) YELLOW   APPearance HAZY (A) CLEAR   Glucose, UA NEGATIVE NEGATIVE mg/dL   Bilirubin Urine NEGATIVE NEGATIVE   Ketones, ur NEGATIVE NEGATIVE mg/dL   Specific Gravity, Urine 1.021 1.005 - 1.030   Hgb urine dipstick NEGATIVE NEGATIVE   pH 7.0 5.0 - 8.0   Protein, ur 30 (A) NEGATIVE mg/dL   Nitrite NEGATIVE NEGATIVE   Leukocytes, UA NEGATIVE NEGATIVE   RBC / HPF 0-5 0 - 5 RBC/hpf   WBC, UA 0-5 0 - 5 WBC/hpf   Bacteria, UA NONE SEEN NONE SEEN   Squamous Epithelial / LPF 6-30 (A) NONE SEEN   Mucous PRESENT   Urine Drug Screen, Qualitative ([facility] only)     Status: Abnormal   Collection Time: 08/03/16  2:33 PM  Result Value Ref Range   Tricyclic, Ur Screen POSITIVE (A) NONE DETECTED   Amphetamines, Ur Screen NONE DETECTED NONE DETECTED   MDMA (Ecstasy)Ur Screen NONE DETECTED NONE DETECTED   Cocaine Metabolite,Ur [place] POSITIVE (A) NONE DETECTED   Opiate, Ur Screen POSITIVE (A)  NONE DETECTED   Phencyclidine (PCP) Ur S NONE DETECTED NONE DETECTED   Cannabinoid 50 Ng, Ur [place] POSITIVE (A) NONE DETECTED   Barbiturates, Ur Screen NONE DETECTED NONE DETECTED   Benzodiazepine, Ur Scrn POSITIVE (A) NONE DETECTED   Methadone Scn, Ur NONE DETECTED NONE DETECTED    Comment: (NOTE) 322  Tricyclics, urine               Cutoff 1000 ng/mL 200  Amphetamines, urine             Cutoff 1000 ng/mL 300  MDMA (Ecstasy), urine           Cutoff 500 ng/mL 400  Cocaine Metabolite, urine       Cutoff 300 ng/mL 500  Opiate, urine                   Cutoff 300 ng/mL 600  Phencyclidine (PCP), urine      Cutoff 25 ng/mL 700  Cannabinoid, urine              Cutoff 50 ng/mL 800  Barbiturates, urine             Cutoff 200 ng/mL 900  Benzodiazepine, urine           Cutoff 200 ng/mL 1000 Methadone, urine                Cutoff 300 ng/mL 1100 1200 The urine drug screen provides only a preliminary, unconfirmed 1300 analytical test result and should not be used for non-medical 1400 purposes. Clinical consideration and professional judgment should 1500 be applied to any positive drug screen result due to possible 1600 interfering substances. A more specific alternate chemical method 1700 must be used in order to obtain a confirmed analytical result.  1800 Gas chromato graphy / mass spectrometry (GC/MS) is the preferred 1900 confirmatory method.   Pregnancy, urine     Status: None   Collection Time: 08/03/16  2:33 PM  Result Value Ref Range  Preg Test, Ur NEGATIVE NEGATIVE  Pregnancy, urine POC     Status: None   Collection Time: 08/03/16  2:46 PM  Result Value Ref Range   Preg Test, Ur NEGATIVE NEGATIVE    Comment:        THE SENSITIVITY OF THIS METHODOLOGY IS >24 mIU/mL     No current facility-administered medications for this encounter.    Current Outpatient Prescriptions  Medication Sig Dispense Refill  . ARIPiprazole (ABILIFY) 20 MG tablet Take 1 tablet (20 mg total) by mouth  daily. 30 tablet 0  . hydrOXYzine (VISTARIL) 25 MG capsule Take 1 capsule (25 mg total) by mouth 3 (three) times daily as needed. 30 capsule 0  . meloxicam (MOBIC) 15 MG tablet Take 1 tablet (15 mg total) by mouth daily. 15 tablet 0  . mirtazapine (REMERON) 7.5 MG tablet Take 1 tablet (7.5 mg total) by mouth at bedtime. 30 tablet 0  . prazosin (MINIPRESS) 2 MG capsule Take 1 capsule (2 mg total) by mouth at bedtime. (Patient taking differently: Take 2 mg by mouth 2 (two) times daily. ) 30 capsule 0  . venlafaxine XR (EFFEXOR-XR) 150 MG 24 hr capsule Take 1 capsule (150 mg total) by mouth daily with breakfast. 30 capsule 0    Musculoskeletal: Strength & Muscle Tone: within normal limits Gait & Station: normal Patient leans: N/A  Psychiatric Specialty Exam: Physical Exam  Nursing note and vitals reviewed. Constitutional: She appears well-developed and well-nourished.  HENT:  Head: Normocephalic and atraumatic.  Eyes: Conjunctivae are normal. Pupils are equal, round, and reactive to light.  Neck: Normal range of motion.  Cardiovascular: Regular rhythm and normal heart sounds.   Respiratory: Effort normal. No respiratory distress.  GI: Soft.  Musculoskeletal: Normal range of motion.  Neurological: She is alert.  Skin: Skin is warm and dry.  Psychiatric: Her affect is blunt. Her speech is delayed. She is slowed. She expresses impulsivity. She expresses suicidal ideation. She expresses no suicidal plans. She exhibits abnormal recent memory.    Review of Systems  Constitutional: Negative.   HENT: Negative.   Eyes: Negative.   Respiratory: Negative.   Cardiovascular: Negative.   Gastrointestinal: Negative.   Musculoskeletal: Negative.   Skin: Negative.   Neurological: Negative.   Psychiatric/Behavioral: Positive for depression, hallucinations, memory loss, substance abuse and suicidal ideas. The patient is nervous/anxious and has insomnia.     Blood pressure 112/66, pulse 78,  temperature 98.1 F (36.7 C), temperature source Oral, resp. rate 18, height '5\' 8"'  (1.727 m), weight 72.6 kg (160 lb), last menstrual period 07/15/2016, SpO2 97 %.Body mass index is 24.33 kg/m.  General Appearance: Disheveled  Eye Contact:  Fair  Speech:  Garbled  Volume:  Decreased  Mood:  Dysphoric  Affect:  Congruent  Thought Process:  Goal Directed  Orientation:  Full (Time, Place, and Person)  Thought Content:  Logical and Hallucinations: Auditory  Suicidal Thoughts:  No  Homicidal Thoughts:  No  Memory:  Immediate;   Good Recent;   Fair Remote;   Fair  Judgement:  Fair  Insight:  Fair  Psychomotor Activity:  Decreased  Concentration:  Concentration: Fair  Recall:  [employer] of Knowledge:  Fair  Language:  Fair  Akathisia:  No  Handed:  Right  AIMS (if indicated):     Assets:  Communication Skills Desire for Improvement Housing Physical Health Resilience  ADL's:  Intact  Cognition:  Impaired,  Mild  Sleep:  Treatment Plan Summary: Medication management and Plan This is a 45 year old woman with chronic mental health and substance abuse issues. She came into the hospital saying she was having some hallucinations but they do not seem to be getting in the way of her thinking and behavior right now. She is not acting as though she is responding to internal stimuli. Patient had talked about having some suicidal thoughts but does not have any intention or plan to act on it. We talked about the fact that there were no hospital beds available here for the weekend. I assured her that if it were a matter of safety that we could keep her in the hospital and try referring her out but encouraged her to be open with me about her potential dangerousness. Patient says she feels like she would be safe to go home. She does not actually have any intention of trying to hurt herself. She agrees that she will stay on her current medicine. I have encouraged the patient strongly to not go  back to using cocaine again which has clearly worsened all of her symptoms. Case reviewed with emergency room doctor. I am agreeable to giving her a prescription for a low dose of Vistaril, 25 mg when necessary 30 pills only no refills. This can be used when necessary for anxiety. Patient says she will come back to the emergency room if suicidal ideation gets door she otherwise feels in danger.  Disposition: Patient does not meet criteria for psychiatric inpatient admission. Supportive therapy provided about ongoing stressors.  Alethia Berthold, MD 08/03/2016 4:29 PM

## 2016-08-15 ENCOUNTER — Other Ambulatory Visit: Payer: Self-pay | Admitting: Specialist

## 2016-08-21 ENCOUNTER — Encounter
Admission: RE | Admit: 2016-08-21 | Discharge: 2016-08-21 | Disposition: A | Discharge status: Home / Self Care | Payer: Medicare Other | Source: Ambulatory Visit | Attending: Specialist | Admitting: Specialist

## 2016-08-21 HISTORY — DX: Headache, unspecified: R51.9

## 2016-08-21 HISTORY — DX: Headache: R51

## 2016-08-21 HISTORY — DX: Other psychoactive substance abuse, uncomplicated: F19.10

## 2016-08-21 NOTE — Patient Instructions (Signed)
  Your procedure is scheduled on: 08/27/16 Report to Day Surgery.[facility] SECOND FLOOR To find out your arrival time please call [phone] between 1PM - 3PM on  08/24/16.  Remember: Instructions that are not followed completely may result in serious medical risk, up to and including death, or upon the discretion of your surgeon and anesthesiologist your surgery may need to be rescheduled.    _X___ 1. Do not eat food or drink liquids after midnight. No gum chewing or hard candies.     __X__ 2. No Alcohol for 24 hours before or after surgery.   _X___ 3. Do Not Smoke For 24 Hours Prior to Your Surgery.   ____ 4. Bring all medications with you on the day of surgery if instructed.    ___X_ 5. Notify your doctor if there is any change in your medical condition     (cold, fever, infections).       Do not wear jewelry, make-up, hairpins, clips or nail polish.  Do not wear lotions, powders, or perfumes. You may wear deodorant.  Do not shave 48 hours prior to surgery. Men may shave face and neck.  Do not bring valuables to the hospital.    [facility] any belongings or valuables.               Contacts, dentures or bridgework may not be worn into surgery.  Leave your suitcase in the car. After surgery it may be brought to your room.  For patients admitted to the hospital, discharge time is determined by your                treatment team.   Patients discharged the day of surgery will not be allowed to drive home.   __X__ Take these medicines the morning of surgery with A SIP OF WATER:    1. CLONAZEPAM  2. VENLAFAXINE  3.   4.  5.  6.  ____ Fleet Enema (as directed)   ____ Use CHG Soap as directed  ____ Use inhalers on the day of surgery  ____ Stop metformin 2 days prior to surgery    ____ Take 1/2 of usual insulin dose the night before surgery and none on the morning of surgery.   ____ Stop Coumadin/Plavix/aspirin on  ____ Stop  Anti-inflammatories on    ____ Stop supplements until after surgery.    ____ Bring C-Pap to the hospital.

## 2016-08-22 NOTE — Pre-Procedure Instructions (Signed)
Met B results and request for potassium supplement faxed to Dr. Ammie Ferrier office.

## 2016-08-26 MED ORDER — CEFAZOLIN SODIUM-DEXTROSE 2-4 GM/100ML-% IV SOLN: 2.0000 g | Freq: Once | INTRAVENOUS | Status: DC

## 2016-08-26 MED ORDER — CLINDAMYCIN PHOSPHATE 600 MG/50ML IV SOLN
600.0000 mg | Freq: Once | INTRAVENOUS | Status: DC
Start: 2016-08-26 — End: 2016-08-29

## 2016-08-27 ENCOUNTER — Ambulatory Visit: Payer: Medicare Other | Admitting: Anesthesiology

## 2016-08-27 ENCOUNTER — Ambulatory Visit
Admission: RE | Admit: 2016-08-27 | Discharge: 2016-08-27 | Disposition: A | Discharge status: Home / Self Care | Payer: Medicare Other | Source: Ambulatory Visit | Attending: Specialist | Admitting: Specialist

## 2016-08-27 ENCOUNTER — Encounter: Payer: Self-pay | Admitting: *Deleted

## 2016-08-27 ENCOUNTER — Encounter
Admission: RE | Disposition: A | Discharge status: Home / Self Care | Payer: Self-pay | Source: Ambulatory Visit | Attending: Specialist

## 2016-08-27 DIAGNOSIS — F4310 Post-traumatic stress disorder, unspecified: Secondary | ICD-10-CM | POA: Diagnosis not present

## 2016-08-27 DIAGNOSIS — Z79899 Other long term (current) drug therapy: Secondary | ICD-10-CM | POA: Insufficient documentation

## 2016-08-27 DIAGNOSIS — F3181 Bipolar II disorder: Secondary | ICD-10-CM | POA: Insufficient documentation

## 2016-08-27 DIAGNOSIS — F259 Schizoaffective disorder, unspecified: Secondary | ICD-10-CM | POA: Diagnosis not present

## 2016-08-27 DIAGNOSIS — M1812 Unilateral primary osteoarthritis of first carpometacarpal joint, left hand: Secondary | ICD-10-CM | POA: Insufficient documentation

## 2016-08-27 DIAGNOSIS — Z538 Procedure and treatment not carried out for other reasons: Secondary | ICD-10-CM | POA: Insufficient documentation

## 2016-08-27 LAB — URINE DRUG SCREEN, QUALITATIVE (ARMC ONLY)
Amphetamines, Ur Screen: NOT DETECTED
Barbiturates, Ur Screen: NOT DETECTED
Benzodiazepine, Ur Scrn: NOT DETECTED
Cannabinoid 50 Ng, Ur ~~LOC~~: POSITIVE — AB
Cocaine Metabolite,Ur ~~LOC~~: POSITIVE — AB
MDMA (Ecstasy)Ur Screen: NOT DETECTED
Methadone Scn, Ur: NOT DETECTED
Opiate, Ur Screen: NOT DETECTED
Phencyclidine (PCP) Ur S: NOT DETECTED
Tricyclic, Ur Screen: NOT DETECTED

## 2016-08-27 LAB — POCT PREGNANCY, URINE: Preg Test, Ur: NEGATIVE

## 2016-08-27 LAB — POCT I-STAT 4, (NA,K, GLUC, HGB,HCT)
Glucose, Bld: 95 mg/dL (ref 65–99)
HCT: 38 % (ref 36.0–46.0)
HEMOGLOBIN: 12.9 g/dL (ref 12.0–15.0)
POTASSIUM: 4.3 mmol/L (ref 3.5–5.1)
Sodium: 141 mmol/L (ref 135–145)

## 2016-08-27 SURGERY — ARTHROPLASTY, FINGER
Anesthesia: General | Laterality: Left

## 2016-08-27 MED ORDER — FAMOTIDINE 20 MG PO TABS: 20.0000 mg | ORAL_TABLET | Freq: Once | ORAL | Status: DC

## 2016-08-27 MED ORDER — LACTATED RINGERS IV SOLN: INTRAVENOUS | Status: DC

## 2016-08-27 MED ORDER — CHLORHEXIDINE GLUCONATE CLOTH 2 % EX PADS: 6.0000 | MEDICATED_PAD | Freq: Once | CUTANEOUS | Status: DC

## 2016-08-27 MED ORDER — MELOXICAM 7.5 MG PO TABS
ORAL_TABLET | ORAL | Status: AC
  Filled 2016-08-27: qty 2

## 2016-08-27 MED ORDER — GABAPENTIN 400 MG PO CAPS: 400.0000 mg | ORAL_CAPSULE | Freq: Once | ORAL | Status: DC

## 2016-08-27 MED ORDER — CEFAZOLIN SODIUM-DEXTROSE 2-4 GM/100ML-% IV SOLN
INTRAVENOUS | Status: AC
  Filled 2016-08-27: qty 100

## 2016-08-27 MED ORDER — NEOMYCIN-POLYMYXIN B GU 40-200000 IR SOLN
Status: AC
  Filled 2016-08-27: qty 2

## 2016-08-27 MED ORDER — BUPIVACAINE HCL (PF) 0.5 % IJ SOLN
INTRAMUSCULAR | Status: AC
  Filled 2016-08-27: qty 30

## 2016-08-27 MED ORDER — CLINDAMYCIN PHOSPHATE 600 MG/50ML IV SOLN
INTRAVENOUS | Status: AC
  Filled 2016-08-27: qty 50

## 2016-08-27 MED ORDER — GABAPENTIN 400 MG PO CAPS
ORAL_CAPSULE | ORAL | Status: AC
  Filled 2016-08-27: qty 1

## 2016-08-27 SURGICAL SUPPLY — 44 items
BLADE OSC/SAGITTAL MD 5.5X18 (BLADE) ×3 IMPLANT
BLADE SURG MINI STRL (BLADE) ×3 IMPLANT
BNDG ESMARK 4X12 TAN STRL LF (GAUZE/BANDAGES/DRESSINGS) ×3 IMPLANT
CANISTER SUCT 1200ML W/VALVE (MISCELLANEOUS) ×3 IMPLANT
CHLORAPREP W/TINT 26ML (MISCELLANEOUS) ×3 IMPLANT
CUFF TOURN 18 STER (MISCELLANEOUS) IMPLANT
DECANTER SPIKE VIAL GLASS SM (MISCELLANEOUS) ×3 IMPLANT
DRAPE FLUOR MINI C-ARM 54X84 (DRAPES) ×3 IMPLANT
ELECT REM PT RETURN 9FT ADLT (ELECTROSURGICAL) ×3
ELECTRODE REM PT RTRN 9FT ADLT (ELECTROSURGICAL) ×1 IMPLANT
GAUZE FLUFF 18X24 1PLY STRL (GAUZE/BANDAGES/DRESSINGS) ×9 IMPLANT
GAUZE PETRO XEROFOAM 1X8 (MISCELLANEOUS) ×3 IMPLANT
GLOVE BIO SURGEON STRL SZ7.5 (GLOVE) ×3 IMPLANT
GLOVE BIO SURGEON STRL SZ8 (GLOVE) ×3 IMPLANT
GOWN STRL REUS W/ TWL LRG LVL3 (GOWN DISPOSABLE) ×1 IMPLANT
GOWN STRL REUS W/TWL LRG LVL3 (GOWN DISPOSABLE) ×2
GOWN STRL REUS W/TWL LRG LVL4 (GOWN DISPOSABLE) ×3 IMPLANT
KIT RM TURNOVER STRD PROC AR (KITS) ×3 IMPLANT
LOOP VESSEL MINI 0.8X406 BLUE (MISCELLANEOUS) ×1 IMPLANT
LOOPS BLUE MINI 0.8X406MM (MISCELLANEOUS) ×2
NEEDLE FILTER BLUNT 18X 1/2SAF (NEEDLE) ×2
NEEDLE FILTER BLUNT 18X1 1/2 (NEEDLE) ×1 IMPLANT
NS IRRIG 500ML POUR BTL (IV SOLUTION) ×3 IMPLANT
PACK EXTREMITY ARMC (MISCELLANEOUS) ×3 IMPLANT
PAD CAST CTTN 4X4 STRL (SOFTGOODS) ×1 IMPLANT
PADDING CAST COTTON 4X4 STRL (SOFTGOODS) ×2
PASSER SUT SWANSON 36MM LOOP (INSTRUMENTS) ×3 IMPLANT
SPLINT CAST 1 STEP 3X12 (MISCELLANEOUS) ×3 IMPLANT
SPONGE LAP 18X18 5 PK (GAUZE/BANDAGES/DRESSINGS) ×3 IMPLANT
STOCKINETTE BIAS CUT 4 980044 (GAUZE/BANDAGES/DRESSINGS) ×3 IMPLANT
STOCKINETTE STRL 4IN 9604848 (GAUZE/BANDAGES/DRESSINGS) ×3 IMPLANT
SUT ETHILON 4-0 (SUTURE) ×2
SUT ETHILON 4-0 FS2 18XMFL BLK (SUTURE) ×1
SUT ETHILON 5-0 FS-2 18 BLK (SUTURE) ×3 IMPLANT
SUT VIC AB 2-0 SH 27 (SUTURE) ×2
SUT VIC AB 2-0 SH 27XBRD (SUTURE) ×1 IMPLANT
SUT VIC AB 3-0 SH 27 (SUTURE) ×2
SUT VIC AB 3-0 SH 27X BRD (SUTURE) ×1 IMPLANT
SUT VIC AB 4-0 SH 27 (SUTURE) ×2
SUT VIC AB 4-0 SH 27XANBCTRL (SUTURE) ×1 IMPLANT
SUTURE ETHLN 4-0 FS2 18XMF BLK (SUTURE) ×1 IMPLANT
SYR 30ML LL (SYRINGE) ×3 IMPLANT
SYR 5ML LL (SYRINGE) ×3 IMPLANT
WIRE Z .062 C-WIRE SPADE TIP (WIRE) ×3 IMPLANT

## 2016-08-27 NOTE — Anesthesia Preprocedure Evaluation (Deleted)
Anesthesia Evaluation  Patient identified by MRN, date of birth, ID band Patient awake    Reviewed: Allergy & Precautions, NPO status , Patient's Chart, lab work & pertinent test results, reviewed documented beta blocker date and time   Airway Mallampati: II  TM Distance: >3 FB     Dental  (+) Chipped   Pulmonary Current Smoker,           Cardiovascular      Neuro/Psych  Headaches, PSYCHIATRIC DISORDERS Depression Bipolar Disorder Schizophrenia    GI/Hepatic   Endo/Other    Renal/GU      Musculoskeletal  (+) Arthritis ,   Abdominal   Peds  Hematology   Anesthesia Other Findings Hx drug abuse.  Reproductive/Obstetrics                             Anesthesia Physical Anesthesia Plan  ASA: III  Anesthesia Plan: General   Post-op Pain Management:    Induction: Intravenous  Airway Management Planned: LMA  Additional Equipment:   Intra-op Plan:   Post-operative Plan:   Informed Consent: I have reviewed the patients History and Physical, chart, labs and discussed the procedure including the risks, benefits and alternatives for the proposed anesthesia with the patient or authorized representative who has indicated his/her understanding and acceptance.     Plan Discussed with: CRNA  Anesthesia Plan Comments:         Anesthesia Quick Evaluation

## 2016-08-27 NOTE — OR Nursing (Signed)
Dr Maisie Fusthomas cancelled surgery due to positive urine drug screen. Dr Hyacinth MeekerMiller notified. Dr Hyacinth MeekerMiller gave instruction for pt to follow up with primary care doc to develop a plan to insure patient is drug free before rescheduling her surgery

## 2016-08-27 NOTE — OR Nursing (Signed)
small scratches noted on inside of right arm - pt states she scratched it on the furniture at her house and scratches noted on inside of left arm pt states her dog scratched her with her claws 2 days ago

## 2016-08-27 NOTE — OR Nursing (Signed)
Pt DISCHARGED TO HOME

## 2016-12-20 ENCOUNTER — Encounter: Payer: Self-pay | Admitting: Emergency Medicine

## 2016-12-20 ENCOUNTER — Emergency Department
Admission: EM | Admit: 2016-12-20 | Discharge: 2016-12-20 | Disposition: A | Discharge status: Home / Self Care | Payer: Medicare Other | Attending: Emergency Medicine | Admitting: Emergency Medicine

## 2016-12-20 DIAGNOSIS — F4310 Post-traumatic stress disorder, unspecified: Secondary | ICD-10-CM | POA: Insufficient documentation

## 2016-12-20 DIAGNOSIS — Y999 Unspecified external cause status: Secondary | ICD-10-CM | POA: Diagnosis not present

## 2016-12-20 DIAGNOSIS — F17200 Nicotine dependence, unspecified, uncomplicated: Secondary | ICD-10-CM | POA: Diagnosis not present

## 2016-12-20 DIAGNOSIS — Z23 Encounter for immunization: Secondary | ICD-10-CM | POA: Insufficient documentation

## 2016-12-20 DIAGNOSIS — W268XXA Contact with other sharp object(s), not elsewhere classified, initial encounter: Secondary | ICD-10-CM | POA: Diagnosis not present

## 2016-12-20 DIAGNOSIS — F1110 Opioid abuse, uncomplicated: Secondary | ICD-10-CM | POA: Insufficient documentation

## 2016-12-20 DIAGNOSIS — Y9389 Activity, other specified: Secondary | ICD-10-CM | POA: Insufficient documentation

## 2016-12-20 DIAGNOSIS — Y929 Unspecified place or not applicable: Secondary | ICD-10-CM | POA: Insufficient documentation

## 2016-12-20 DIAGNOSIS — S61512A Laceration without foreign body of left wrist, initial encounter: Secondary | ICD-10-CM | POA: Diagnosis not present

## 2016-12-20 DIAGNOSIS — F319 Bipolar disorder, unspecified: Secondary | ICD-10-CM | POA: Insufficient documentation

## 2016-12-20 MED ORDER — OXYCODONE-ACETAMINOPHEN 5-325 MG PO TABS
1.0000 | ORAL_TABLET | Freq: Once | ORAL | Status: AC
  Administered 2016-12-20: 1 via ORAL
  Filled 2016-12-20: qty 1

## 2016-12-20 MED ORDER — TETANUS-DIPHTH-ACELL PERTUSSIS 5-2.5-18.5 LF-MCG/0.5 IM SUSP
0.5000 mL | Freq: Once | INTRAMUSCULAR | Status: AC
  Administered 2016-12-20: 0.5 mL via INTRAMUSCULAR
  Filled 2016-12-20: qty 0.5

## 2016-12-20 MED ORDER — LIDOCAINE HCL (PF) 1 % IJ SOLN
INTRAMUSCULAR | Status: AC
  Filled 2016-12-20: qty 5

## 2016-12-20 MED ORDER — OXYCODONE-ACETAMINOPHEN 5-325 MG PO TABS: 1.0000 | ORAL_TABLET | Freq: Four times a day (QID) | ORAL | 0 refills | Status: DC | PRN

## 2016-12-20 NOTE — ED Provider Notes (Signed)
[facility] Emergency Department Provider Note   ____________________________________________   None    (approximate)  I have reviewed the triage vital signs and the nursing notes.   HISTORY  Chief Complaint Laceration    HPI Mercedes Forbes is a 46 y.o. female patient arrived via EMS with a laceration to the left volar wrist. Patient states she was using a box cutter and the utility slipped cutting her wrist. Patient state bleeding is controlled with direct pressure. Patient denies loss sensation or loss of function. Patient is right-hand dominant. Patient is unsure of her last tetanus shot.He denies loss sensation or loss of function. Patient rates the pain as a 9/10. Patient describes pain as "achy".   Past Medical History:  Diagnosis Date  . Arthritis   . Bipolar 1 disorder (HCC)   . Headache    migraines  . Personality disorder   . Polysubstance abuse   . Schizophrenia [facility])     Patient Active Problem List   Diagnosis Date Noted  . Schizoaffective disorder (HCC) 08/03/2016  . Opiate abuse, episodic 07/22/2016  . PTSD (post-traumatic stress disorder) 04/04/2016  . Tobacco use disorder 04/03/2016  . Bipolar 2 disorder, major depressive episode (HCC) 04/02/2016  . Cocaine use disorder, moderate, dependence (HCC) 04/02/2016  . Cannabis use disorder, moderate, dependence (HCC) 04/02/2016    Past Surgical History:  Procedure Laterality Date  . KNEE ARTHROSCOPY WITH PATELLA RECONSTRUCTION     left patella removed  . wrists Bilateral     Prior to Admission medications   Medication Sig Start Date End Date Taking? Authorizing Provider  acetaminophen-codeine (TYLENOL #3) 300-30 MG tablet Take 1 tablet by mouth 3 (three) times daily as needed for pain. 08/11/16   Historical Provider, MD  ARIPiprazole (ABILIFY) 20 MG tablet Take 1 tablet (20 mg total) by mouth daily. 07/26/16   [name], MD  clonazePAM (KLONOPIN) 1 MG tablet  Take 1 mg by mouth 2 (two) times daily. 08/08/16   Historical Provider, MD  oxyCODONE-acetaminophen (ROXICET) 5-325 MG tablet Take 1 tablet by mouth every 6 (six) hours as needed for moderate pain. 12/20/16   Joni Reining, PA-C  potassium chloride (K-DUR,KLOR-CON) 10 MEQ tablet Take 30 mEq by mouth 2 (two) times daily.    Historical Provider, MD  prazosin (MINIPRESS) 2 MG capsule Take 1 capsule (2 mg total) by mouth at bedtime. Patient taking differently: Take 2 mg by mouth 2 (two) times daily.  07/25/16   [name], MD  venlafaxine XR (EFFEXOR-XR) 150 MG 24 hr capsule Take 1 capsule (150 mg total) by mouth daily with breakfast. 07/26/16   [name], MD  zolpidem (AMBIEN) 10 MG tablet Take 10 mg by mouth at bedtime as needed for sleep.    Historical Provider, MD    Allergies Aspirin; Flurbiprofen; and Nsaids  No family history on file.  Social History Social History  Substance Use Topics  . Smoking status: Current Every Day Smoker    Packs/day: 1.00  . Smokeless tobacco: Never Used     Comment: Nicotine patch was ordered  . Alcohol use No    Review of Systems Constitutional: No fever/chills Eyes: No visual changes. ENT: No sore throat. Cardiovascular: Denies chest pain. Respiratory: Denies shortness of breath. Gastrointestinal: No abdominal pain.  No nausea, no vomiting.  No diarrhea.  No constipation. Genitourinary: Negative for dysuria. Musculoskeletal: Negative for back pain. Skin: Negative for rash. Neurological: Negative for headaches, focal weakness or numbness. Psychiatric:PTSD, bipolar, and  opiate abuse. Allergic/Immunilogical: See medication list ____________________________________________   PHYSICAL EXAM:  VITAL SIGNS: ED Triage Vitals  Enc Vitals Group     BP 12/20/16 1050 120/79     Pulse Rate 12/20/16 1050 87     Resp 12/20/16 1050 16     Temp 12/20/16 1050 98.9 F (37.2 C)     Temp Source 12/20/16 1050 Oral     SpO2  12/20/16 1050 99 %     Weight 12/20/16 1050 170 lb (77.1 kg)     Height 12/20/16 1050  (1.727 m)     Head Circumference --      Peak Flow --      Pain Score 12/20/16 1052 9     Pain Loc --      Pain Edu? --      Excl. in GC? --     Constitutional: Alert and oriented. Well appearing and in no acute distress. Eyes: Conjunctivae are normal. PERRL. EOMI. Head: Atraumatic. Nose: No congestion/rhinnorhea. Mouth/Throat: Mucous membranes are moist.  Oropharynx non-erythematous. Neck: No stridor.  No cervical spine tenderness to palpation. Hematological/Lymphatic/Immunilogical: No cervical lymphadenopathy. Cardiovascular: Normal rate, regular rhythm. Grossly normal heart sounds.  Good peripheral circulation. Respiratory: Normal respiratory effort.  No retractions. Lungs CTAB. Gastrointestinal: Soft and nontender. No distention. No abdominal bruits. No CVA tenderness. Musculoskeletal: No lower extremity tenderness nor edema.  No joint effusions. Neurologic:  Normal speech and language. No gross focal neurologic deficits are appreciated. No gait instability. Skin:  Skin is warm, dry and intact. No rash noted.1.5 cm laceration volar aspect of left wrist. Psychiatric: Mood and affect are normal. Speech and behavior are normal.  ____________________________________________   LABS (all labs ordered are listed, but only abnormal results are displayed)  Labs Reviewed - No data to display ____________________________________________  EKG   ____________________________________________  RADIOLOGY   ____________________________________________   PROCEDURES  Procedure(s) performed: LACERATION REPAIR Performed by: Joni Reining Authorized by: Joni Reining Consent: Verbal consent obtained. Risks and benefits: risks, benefits and alternatives were discussed Consent given by: patient Patient identity confirmed: provided demographic data Prepped and Draped in normal sterile  fashion Wound explored  Laceration Location: Left wrist  Laceration Length: 1.5cm  No Foreign Bodies seen or palpated  Anesthesia: local infiltration  Local anesthetic: lidocaine 1% without epinephrine  Anesthetic total:  ml  Irrigation method: syringe Amount of cleaning: standard  Skin closure: 3-0 nylon   Number of sutures: 4 Technique: Interrupted Patient tolerance: Patient tolerated the procedure well with no immediate complications.   Procedures  Critical Care performed: No  ____________________________________________   INITIAL IMPRESSION / ASSESSMENT AND PLAN / ED COURSE  Pertinent labs & imaging results that were available during my care of the patient were reviewed by me and considered in my medical decision making (see chart for details).  Left wrist laceration. Wound was sutured.      ____________________________________________   FINAL CLINICAL IMPRESSION(S) / ED DIAGNOSES  Final diagnoses:  Laceration of left wrist, initial encounter  Patient given discharge care instructions. Patient advised return back 10 days for suture removal. Patient given 3 days prescription for Percocets take as needed for pain.    NEW MEDICATIONS STARTED DURING THIS VISIT:  New Prescriptions   OXYCODONE-ACETAMINOPHEN (ROXICET) 5-325 MG TABLET    Take 1 tablet by mouth every 6 (six) hours as needed for moderate pain.     Note:  This document was prepared using Dragon voice recognition software and may include unintentional dictation  errors.    Joni Reining, PA-C 12/20/16 1141    Jene Every, MD 12/20/16 1332

## 2016-12-20 NOTE — ED Notes (Signed)
See triage note  Presents with laceration to left wrist area   States she was using a box cutter   It slipped ..laceration noted to posterior wrist area

## 2016-12-20 NOTE — ED Triage Notes (Signed)
Pt in via EMS from home, pt reports opening up boxes, box cutter slipped, cutting left wrist.  Pressure bandage in place per first responder, bleeding controlled at this time.

## 2016-12-28 ENCOUNTER — Emergency Department
Admission: EM | Admit: 2016-12-28 | Discharge: 2016-12-28 | Disposition: A | Discharge status: Other Acute Inpatient Hospital | Payer: Medicare Other | Attending: Emergency Medicine | Admitting: Emergency Medicine

## 2016-12-28 ENCOUNTER — Encounter: Payer: Self-pay | Admitting: Emergency Medicine

## 2016-12-28 ENCOUNTER — Inpatient Hospital Stay
Admission: AD | Admit: 2016-12-28 | Discharge: 2017-01-01 | DRG: 885 | Disposition: A | Discharge status: Home / Self Care | Payer: Medicare Other | Source: Intra-hospital | Attending: Psychiatry | Admitting: Psychiatry

## 2016-12-28 DIAGNOSIS — F1220 Cannabis dependence, uncomplicated: Secondary | ICD-10-CM | POA: Diagnosis present

## 2016-12-28 DIAGNOSIS — Z886 Allergy status to analgesic agent status: Secondary | ICD-10-CM | POA: Diagnosis not present

## 2016-12-28 DIAGNOSIS — Z62810 Personal history of physical and sexual abuse in childhood: Secondary | ICD-10-CM | POA: Diagnosis present

## 2016-12-28 DIAGNOSIS — F1110 Opioid abuse, uncomplicated: Secondary | ICD-10-CM | POA: Diagnosis present

## 2016-12-28 DIAGNOSIS — Z818 Family history of other mental and behavioral disorders: Secondary | ICD-10-CM

## 2016-12-28 DIAGNOSIS — Z653 Problems related to other legal circumstances: Secondary | ICD-10-CM

## 2016-12-28 DIAGNOSIS — F1420 Cocaine dependence, uncomplicated: Secondary | ICD-10-CM | POA: Diagnosis present

## 2016-12-28 DIAGNOSIS — F17200 Nicotine dependence, unspecified, uncomplicated: Secondary | ICD-10-CM | POA: Diagnosis present

## 2016-12-28 DIAGNOSIS — F4310 Post-traumatic stress disorder, unspecified: Secondary | ICD-10-CM | POA: Diagnosis present

## 2016-12-28 DIAGNOSIS — G4700 Insomnia, unspecified: Secondary | ICD-10-CM | POA: Diagnosis present

## 2016-12-28 DIAGNOSIS — F209 Schizophrenia, unspecified: Secondary | ICD-10-CM | POA: Diagnosis present

## 2016-12-28 DIAGNOSIS — F3160 Bipolar disorder, current episode mixed, unspecified: Secondary | ICD-10-CM | POA: Diagnosis present

## 2016-12-28 DIAGNOSIS — F3181 Bipolar II disorder: Secondary | ICD-10-CM | POA: Diagnosis present

## 2016-12-28 DIAGNOSIS — F410 Panic disorder [episodic paroxysmal anxiety] without agoraphobia: Secondary | ICD-10-CM | POA: Diagnosis present

## 2016-12-28 DIAGNOSIS — Z915 Personal history of self-harm: Secondary | ICD-10-CM | POA: Diagnosis not present

## 2016-12-28 DIAGNOSIS — F603 Borderline personality disorder: Secondary | ICD-10-CM | POA: Diagnosis present

## 2016-12-28 DIAGNOSIS — Z79899 Other long term (current) drug therapy: Secondary | ICD-10-CM | POA: Diagnosis not present

## 2016-12-28 DIAGNOSIS — R45851 Suicidal ideations: Secondary | ICD-10-CM | POA: Diagnosis present

## 2016-12-28 DIAGNOSIS — Z599 Problem related to housing and economic circumstances, unspecified: Secondary | ICD-10-CM

## 2016-12-28 LAB — CBC
HCT: 38.3 % (ref 35.0–47.0)
HEMOGLOBIN: 13.2 g/dL (ref 12.0–16.0)
MCH: 32.4 pg (ref 26.0–34.0)
MCHC: 34.5 g/dL (ref 32.0–36.0)
MCV: 93.9 fL (ref 80.0–100.0)
Platelets: 236 10*3/uL (ref 150–440)
RBC: 4.07 MIL/uL (ref 3.80–5.20)
RDW: 15 % — ABNORMAL HIGH (ref 11.5–14.5)
WBC: 9.3 10*3/uL (ref 3.6–11.0)

## 2016-12-28 LAB — COMPREHENSIVE METABOLIC PANEL
ALK PHOS: 85 U/L (ref 38–126)
ALT: 13 U/L — AB (ref 14–54)
AST: 26 U/L (ref 15–41)
Albumin: 3.8 g/dL (ref 3.5–5.0)
Anion gap: 6 (ref 5–15)
BUN: 12 mg/dL (ref 6–20)
CALCIUM: 8.8 mg/dL — AB (ref 8.9–10.3)
CHLORIDE: 106 mmol/L (ref 101–111)
CO2: 23 mmol/L (ref 22–32)
CREATININE: 0.81 mg/dL (ref 0.44–1.00)
Glucose, Bld: 103 mg/dL — ABNORMAL HIGH (ref 65–99)
Potassium: 3.2 mmol/L — ABNORMAL LOW (ref 3.5–5.1)
Sodium: 135 mmol/L (ref 135–145)
Total Bilirubin: 0.4 mg/dL (ref 0.3–1.2)
Total Protein: 7.1 g/dL (ref 6.5–8.1)

## 2016-12-28 LAB — URINE DRUG SCREEN, QUALITATIVE (ARMC ONLY)
Amphetamines, Ur Screen: NOT DETECTED
BARBITURATES, UR SCREEN: NOT DETECTED
Benzodiazepine, Ur Scrn: NOT DETECTED
CANNABINOID 50 NG, UR ~~LOC~~: POSITIVE — AB
COCAINE METABOLITE, UR ~~LOC~~: POSITIVE — AB
MDMA (ECSTASY) UR SCREEN: NOT DETECTED
Methadone Scn, Ur: NOT DETECTED
Opiate, Ur Screen: NOT DETECTED
PHENCYCLIDINE (PCP) UR S: NOT DETECTED
TRICYCLIC, UR SCREEN: NOT DETECTED

## 2016-12-28 LAB — ETHANOL

## 2016-12-28 LAB — ACETAMINOPHEN LEVEL: Acetaminophen (Tylenol), Serum: <10 ug/mL — ABNORMAL LOW (ref 10–30)

## 2016-12-28 LAB — SALICYLATE LEVEL

## 2016-12-28 LAB — POCT PREGNANCY, URINE: Preg Test, Ur: NEGATIVE

## 2016-12-28 MED ORDER — HYDROXYZINE HCL 25 MG PO TABS
25.0000 mg | ORAL_TABLET | Freq: Three times a day (TID) | ORAL | Status: DC | PRN
Start: 2016-12-28 — End: 2016-12-28

## 2016-12-28 MED ORDER — TRAZODONE HCL 100 MG PO TABS
100.0000 mg | ORAL_TABLET | Freq: Every day | ORAL | Status: DC
  Administered 2016-12-28 – 2016-12-31 (×4): 100 mg via ORAL
  Filled 2016-12-28 (×4): qty 1

## 2016-12-28 MED ORDER — ZOLPIDEM TARTRATE 5 MG PO TABS: 10.0000 mg | ORAL_TABLET | Freq: Every day | ORAL | Status: DC

## 2016-12-28 MED ORDER — LITHIUM CARBONATE ER 300 MG PO TBCR
300.0000 mg | EXTENDED_RELEASE_TABLET | Freq: Every day | ORAL | Status: DC
  Administered 2016-12-28: 300 mg via ORAL
  Filled 2016-12-28: qty 1

## 2016-12-28 MED ORDER — PRAZOSIN HCL 2 MG PO CAPS
2.0000 mg | ORAL_CAPSULE | Freq: Every day | ORAL | Status: DC
  Administered 2016-12-28 – 2016-12-31 (×4): 2 mg via ORAL
  Filled 2016-12-28 (×4): qty 1

## 2016-12-28 MED ORDER — NICOTINE 21 MG/24HR TD PT24
21.0000 mg | MEDICATED_PATCH | Freq: Every day | TRANSDERMAL | Status: DC
  Administered 2016-12-29 – 2017-01-01 (×4): 21 mg via TRANSDERMAL
  Filled 2016-12-28 (×4): qty 1

## 2016-12-28 MED ORDER — ARIPIPRAZOLE 10 MG PO TABS
20.0000 mg | ORAL_TABLET | Freq: Every day | ORAL | Status: DC
  Administered 2016-12-28: 20 mg via ORAL
  Filled 2016-12-28: qty 2

## 2016-12-28 MED ORDER — MAGNESIUM HYDROXIDE 400 MG/5ML PO SUSP: 30.0000 mL | Freq: Every day | ORAL | Status: DC | PRN

## 2016-12-28 MED ORDER — ARIPIPRAZOLE 10 MG PO TABS
20.0000 mg | ORAL_TABLET | Freq: Every day | ORAL | Status: DC
  Administered 2016-12-29 – 2017-01-01 (×4): 20 mg via ORAL
  Filled 2016-12-28 (×4): qty 2

## 2016-12-28 MED ORDER — CLONAZEPAM 1 MG PO TABS: 1.0000 mg | ORAL_TABLET | Freq: Every day | ORAL | Status: DC

## 2016-12-28 MED ORDER — ACETAMINOPHEN 325 MG PO TABS
650.0000 mg | ORAL_TABLET | Freq: Four times a day (QID) | ORAL | Status: DC | PRN
  Administered 2016-12-28 – 2016-12-31 (×5): 650 mg via ORAL
  Filled 2016-12-28 (×5): qty 2

## 2016-12-28 MED ORDER — LITHIUM CARBONATE ER 300 MG PO TBCR: 300.0000 mg | EXTENDED_RELEASE_TABLET | Freq: Two times a day (BID) | ORAL | Status: DC

## 2016-12-28 MED ORDER — POTASSIUM CHLORIDE CRYS ER 20 MEQ PO TBCR
40.0000 meq | EXTENDED_RELEASE_TABLET | Freq: Once | ORAL | Status: AC
  Administered 2016-12-28: 40 meq via ORAL
  Filled 2016-12-28: qty 2

## 2016-12-28 MED ORDER — VENLAFAXINE HCL ER 75 MG PO CP24: 150.0000 mg | ORAL_CAPSULE | Freq: Every day | ORAL | Status: DC

## 2016-12-28 MED ORDER — HYDROXYZINE HCL 50 MG PO TABS: 50.0000 mg | ORAL_TABLET | ORAL | Status: DC | PRN

## 2016-12-28 MED ORDER — ALUM & MAG HYDROXIDE-SIMETH 200-200-20 MG/5ML PO SUSP: 30.0000 mL | ORAL | Status: DC | PRN

## 2016-12-28 MED ORDER — VENLAFAXINE HCL ER 75 MG PO CP24
150.0000 mg | ORAL_CAPSULE | Freq: Every day | ORAL | Status: DC
  Administered 2016-12-29 – 2016-12-31 (×3): 150 mg via ORAL
  Filled 2016-12-28 (×3): qty 2

## 2016-12-28 MED ORDER — CHLORDIAZEPOXIDE HCL 25 MG PO CAPS
25.0000 mg | ORAL_CAPSULE | Freq: Every day | ORAL | Status: DC
  Administered 2016-12-28 – 2016-12-31 (×4): 25 mg via ORAL
  Filled 2016-12-28 (×4): qty 1

## 2016-12-28 MED ORDER — PRAZOSIN HCL 2 MG PO CAPS: 2.0000 mg | ORAL_CAPSULE | Freq: Every day | ORAL | Status: DC

## 2016-12-28 MED ORDER — LITHIUM CARBONATE ER 300 MG PO TBCR
300.0000 mg | EXTENDED_RELEASE_TABLET | Freq: Two times a day (BID) | ORAL | Status: DC
  Administered 2016-12-28: 300 mg via ORAL
  Filled 2016-12-28: qty 1

## 2016-12-28 MED ORDER — TRAZODONE HCL 100 MG PO TABS: 100.0000 mg | ORAL_TABLET | Freq: Every evening | ORAL | Status: DC | PRN

## 2016-12-28 MED ORDER — NICOTINE 21 MG/24HR TD PT24
21.0000 mg | MEDICATED_PATCH | Freq: Every day | TRANSDERMAL | Status: DC
  Administered 2016-12-28: 21 mg via TRANSDERMAL
  Filled 2016-12-28: qty 1

## 2016-12-28 NOTE — ED Notes (Signed)
12 lead ekg in progress per tech from ED.

## 2016-12-28 NOTE — [facility] Suicide Risk Assessment (Signed)
[facility] Admission Suicide Risk Assessment   Nursing information obtained from:  Patient Demographic factors:  Divorced or widowed, Caucasian Current Mental Status:  Suicidal ideation indicated by patient, Self-harm thoughts, Self-harm behaviors Loss Factors:  Loss of significant relationship, Financial problems / change in socioeconomic status Historical Factors:  NA Risk Reduction Factors:  Living with another person, especially a relative   Principal Problem: Bipolar 2 disorder, major depressive episode (HCC) Diagnosis:   Patient Active Problem List   Diagnosis Date Noted  . 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 PTSD (post-traumatic stress disorder) [F43.10] 04/04/2016  . Tobacco use disorder [F17.200] 04/03/2016  . Bipolar 2 disorder, major depressive episode (HCC) [F31.81] 04/02/2016  . Cocaine use disorder, moderate, dependence (HCC) [F14.20] 04/02/2016  . Cannabis use disorder, moderate, dependence (HCC) [F12.20] 04/02/2016   Subjective Data:   Continued Clinical Symptoms:  Alcohol Use Disorder Identification Test Final Score (AUDIT): 5 The "Alcohol Use Disorders Identification Test", Guidelines for Use in Primary Care, Second Edition.  World Science writer [facility]). Score between 0-7:  no or low risk or alcohol related problems. Score between 8-15:  moderate risk of alcohol related problems. Score between 16-19:  high risk of alcohol related problems. Score 20 or above:  warrants further diagnostic evaluation for alcohol dependence and treatment.   CLINICAL FACTORS:   Severe Anxiety and/or Agitation Depression:   Comorbid alcohol abuse/dependence Impulsivity Severe Alcohol/Substance Abuse/Dependencies More than one psychiatric diagnosis Previous Psychiatric Diagnoses and Treatments     Psychiatric Specialty Exam: Physical Exam  ROS  Blood pressure 103/66, pulse 77, temperature 98.7 F (37.1 C), temperature source Oral, resp. rate 18, height  (1.727  m), weight 80.7 kg (178 lb), SpO2 97 %.Body mass index is 27.06 kg/m.                                                    Sleep:         COGNITIVE FEATURES THAT CONTRIBUTE TO RISK:  Polarized thinking    SUICIDE RISK:   Moderate:  Frequent suicidal ideation with limited intensity, and duration, some specificity in terms of plans, no associated intent, good self-control, limited dysphoria/symptomatology, some risk factors present, and identifiable protective factors, including available and accessible social support.  PLAN OF CARE: admit to [facility]  I certify that inpatient services furnished can reasonably be expected to improve the patient's condition.   [name], MD 12/28/2016, 4:09 PM

## 2016-12-28 NOTE — ED Notes (Signed)
Patient ate 100% of lunch and beverage.  

## 2016-12-28 NOTE — Consult Note (Signed)
[facility] Face-to-Face Psychiatry Consult   Reason for Consult:  Consult for 46 year old woman with a history of mood instability and substance abuse who came to the emergency room with suicidal thoughts Referring Physician:  [place] Patient Identification: TAGEN BRETHAUER MRN:  [id number] Principal Diagnosis: Bipolar 2 disorder, major depressive episode [facility]) Diagnosis:   Patient Active Problem List   Diagnosis Date Noted  . Schizoaffective disorder ([place]) [F25.9] 08/03/2016  . 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 PTSD (post-traumatic stress disorder) [F43.10] 04/04/2016  . Tobacco use disorder [F17.200] 04/03/2016  . Bipolar 2 disorder, major depressive episode ([place]) [F31.81] 04/02/2016  . Cocaine use disorder, moderate, dependence ([place]) [F14.20] 04/02/2016  . Cannabis use disorder, moderate, dependence ([place]) [F12.20] 04/02/2016    Total Time spent with patient: 1 hour  Subjective:   Mercedes Forbes is a 46 y.o. female patient admitted with "I'm not feeling good at all".  HPI:  Patient interviewed. Chart reviewed. Patient known from previous encounters. 46 year old woman with a history of mood instability diagnosis of bipolar disorder type II and substance abuse. Comes in reporting her mood is sad and depressed and has been bad for at least a couple months and is getting worse. She has stress from finances in her life. She also has been back using cocaine regularly several times a week as well as smoking marijuana regularly and having semi-frequent use of alcohol. She says she has been compliant with her prescribed medications and has been going to [facility] but does not think her current treatment is effective. She is very tired all the time and stays in bed much of the time. Feels negative and hopeless. No real psychotic symptoms just vague visual hallucinations at times. She cut her wrist a few days ago with a box cutter. She had come to the emergency room to get it so now but had not  admitted at that time that it was self-inflicted intentionally but now she does admit that she was cutting herself on purpose. Has thoughts about hanging herself.  Social history: Patient gets disability. She lives with roommates who are providing any financial support leaving her on the hook for taking care of any everyone. She has significant financial problems as a result. Reasonably good relationship with her mother but at a distance.  Medical history: The laceration to her left wrist is sutured and seems to be healing up okay. Doesn't have any other active medical problems outside of the psychiatric.  Substance abuse history: History of abuse of cocaine and marijuana and alcohol. Has been able to stay sober for several months at a time when she was in appropriate treatment and felt better. Recently not really engaging with attempts to stay sober.  Past Psychiatric History: History of mood instability with bipolar disorder type II. Currently taking Effexor Abilify Klonopin as her primary medicines. Reports that in the past when she was on a more specific mood stabilizer she felt better. She does have a history both of self injury and of more serious suicide attempts no history of violence.  Risk to Self: Suicidal Ideation: Yes-Currently Present Suicidal Intent: Yes-Currently Present Is patient at risk for suicide?: Yes Suicidal Plan?: Yes-Currently Present Specify Current Suicidal Plan: to hang self  Access to Means: Yes Specify Access to Suicidal Means: yes What has been your use of drugs/alcohol within the last 12 months?: Cocaine and THC  How many times?:  (Multiple ) Other Self Harm Risks: drug use  Triggers for Past Attempts: Unpredictable Intentional Self  Injurious Behavior: Cutting Comment - Self Injurious Behavior: cutting  Risk to Others: Homicidal Ideation: No Thoughts of Harm to Others: No Current Homicidal Intent: No Current Homicidal Plan: No Access to Homicidal Means:  No Identified Victim: n/a History of harm to others?: No Assessment of Violence: None Noted Violent Behavior Description: n/a Does patient have access to weapons?: No Criminal Charges Pending?: Yes Describe Pending Criminal Charges: Probation Violation and Larceny  Does patient have a court date: Yes Court Date: 01/10/17 Prior Inpatient Therapy: Prior Inpatient Therapy: Yes Prior Therapy Dates: 2016 Prior Therapy Facilty/Provider(s): [facility] Reason for Treatment: Schizophrenia  Prior Outpatient Therapy: Prior Outpatient Therapy: Yes Prior Therapy Dates: 06/2016 Prior Therapy Facilty/Provider(s): [facility]  Reason for Treatment: Schizophrenia  Does patient have an ACCT team?: No Does patient have Intensive In-House Services?  : No Does patient have [facility] services? : No Does patient have P4CC services?: No  Past Medical History:  Past Medical History:  Diagnosis Date  . Arthritis   . Bipolar 1 disorder ([place])   . Headache    migraines  . Personality disorder   . Polysubstance abuse   . Schizophrenia [facility])     Past Surgical History:  Procedure Laterality Date  . KNEE ARTHROSCOPY WITH PATELLA RECONSTRUCTION     left patella removed  . wrists Bilateral    Family History: History reviewed. No pertinent family history. Family Psychiatric  History: Does not know of any family history of mental health problems Social History:  History  Alcohol Use No     History  Drug Use  . Types: Cocaine    Comment: last 1 month ago    Social History   Social History  . Marital status: Widowed    Spouse name: N/A  . Number of children: N/A  . Years of education: N/A   Social History Main Topics  . Smoking status: Current Every Day Smoker    Packs/day: 1.00  . Smokeless tobacco: Never Used     Comment: Nicotine patch was ordered  . Alcohol use No  . Drug use: Yes    Types: Cocaine     Comment: last 1 month ago  . Sexual activity: No   Other Topics Concern  . None   Social  History Narrative  . None   Additional Social History:    Allergies:   Allergies  Allergen Reactions  . Aspirin Other (See Comments) and Shortness Of Breath    Shortness of breath like an asthma attack  . Flurbiprofen Shortness Of Breath  . Nsaids Shortness Of Breath    Labs:  Results for orders placed or performed during the hospital encounter of 12/28/16 (from the past 48 hour(s))  Comprehensive metabolic panel     Status: Abnormal   Collection Time: 12/28/16  7:58 AM  Result Value Ref Range   Sodium 135 135 - 145 mmol/L   Potassium 3.2 (L) 3.5 - 5.1 mmol/L   Chloride 106 101 - 111 mmol/L   CO2 23 22 - 32 mmol/L   Glucose, Bld 103 (H) 65 - 99 mg/dL   BUN 12 6 - 20 mg/dL   Creatinine, Ser 0.81 0.44 - 1.00 mg/dL   Calcium 8.8 (L) 8.9 - 10.3 mg/dL   Total Protein 7.1 6.5 - 8.1 g/dL   Albumin 3.8 3.5 - 5.0 g/dL   AST 26 15 - 41 U/L   ALT 13 (L) 14 - 54 U/L   Alkaline Phosphatase 85 38 - 126 U/L   Total Bilirubin  0.4 0.3 - 1.2 mg/dL   GFR calc non Af Amer >60 >60 mL/min   GFR calc Af Amer >60 >60 mL/min    Comment: (NOTE) The eGFR has been calculated using the CKD EPI equation. This calculation has not been validated in all clinical situations. eGFR's persistently <60 mL/min signify possible Chronic Kidney Disease.    Anion gap 6 5 - 15  Ethanol     Status: None   Collection Time: 12/28/16  7:58 AM  Result Value Ref Range   Alcohol, Ethyl (B) <5 <5 mg/dL    Comment:        LOWEST DETECTABLE LIMIT FOR SERUM ALCOHOL IS 5 mg/dL FOR MEDICAL PURPOSES ONLY   Salicylate level     Status: None   Collection Time: 12/28/16  7:58 AM  Result Value Ref Range   Salicylate Lvl <4.1 2.8 - 30.0 mg/dL  Acetaminophen level     Status: Abnormal   Collection Time: 12/28/16  7:58 AM  Result Value Ref Range   Acetaminophen (Tylenol), Serum <10 (L) 10 - 30 ug/mL    Comment:        THERAPEUTIC CONCENTRATIONS VARY SIGNIFICANTLY. A RANGE OF 10-30 ug/mL MAY BE AN  EFFECTIVE CONCENTRATION FOR MANY PATIENTS. HOWEVER, SOME ARE BEST TREATED AT CONCENTRATIONS OUTSIDE THIS RANGE. ACETAMINOPHEN CONCENTRATIONS >150 ug/mL AT 4 HOURS AFTER INGESTION AND >50 ug/mL AT 12 HOURS AFTER INGESTION ARE OFTEN ASSOCIATED WITH TOXIC REACTIONS.   cbc     Status: Abnormal   Collection Time: 12/28/16  7:58 AM  Result Value Ref Range   WBC 9.3 3.6 - 11.0 K/uL   RBC 4.07 3.80 - 5.20 MIL/uL   Hemoglobin 13.2 12.0 - 16.0 g/dL   HCT 38.3 35.0 - 47.0 %   MCV 93.9 80.0 - 100.0 fL   MCH 32.4 26.0 - 34.0 pg   MCHC 34.5 32.0 - 36.0 g/dL   RDW 15.0 (H) 11.5 - 14.5 %   Platelets 236 150 - 440 K/uL  Urine Drug Screen, Qualitative     Status: Abnormal   Collection Time: 12/28/16  7:58 AM  Result Value Ref Range   Tricyclic, Ur Screen NONE DETECTED NONE DETECTED   Amphetamines, Ur Screen NONE DETECTED NONE DETECTED   MDMA (Ecstasy)Ur Screen NONE DETECTED NONE DETECTED   Cocaine Metabolite,Ur [place] POSITIVE (A) NONE DETECTED   Opiate, Ur Screen NONE DETECTED NONE DETECTED   Phencyclidine (PCP) Ur S NONE DETECTED NONE DETECTED   Cannabinoid 50 Ng, Ur [place] POSITIVE (A) NONE DETECTED   Barbiturates, Ur Screen NONE DETECTED NONE DETECTED   Benzodiazepine, Ur Scrn NONE DETECTED NONE DETECTED   Methadone Scn, Ur NONE DETECTED NONE DETECTED    Comment: (NOTE) 583  Tricyclics, urine               Cutoff 1000 ng/mL 200  Amphetamines, urine             Cutoff 1000 ng/mL 300  MDMA (Ecstasy), urine           Cutoff 500 ng/mL 400  Cocaine Metabolite, urine       Cutoff 300 ng/mL 500  Opiate, urine                   Cutoff 300 ng/mL 600  Phencyclidine (PCP), urine      Cutoff 25 ng/mL 700  Cannabinoid, urine              Cutoff 50 ng/mL 800  Barbiturates, urine  Cutoff 200 ng/mL 900  Benzodiazepine, urine           Cutoff 200 ng/mL 1000 Methadone, urine                Cutoff 300 ng/mL 1100 1200 The urine drug screen provides only a preliminary, unconfirmed 1300 analytical  test result and should not be used for non-medical 1400 purposes. Clinical consideration and professional judgment should 1500 be applied to any positive drug screen result due to possible 1600 interfering substances. A more specific alternate chemical method 1700 must be used in order to obtain a confirmed analytical result.  1800 Gas chromato graphy / mass spectrometry (GC/MS) is the preferred 1900 confirmatory method.   Pregnancy, urine POC     Status: None   Collection Time: 12/28/16  8:33 AM  Result Value Ref Range   Preg Test, Ur NEGATIVE NEGATIVE    Comment:        THE SENSITIVITY OF THIS METHODOLOGY IS >24 mIU/mL     Current Facility-Administered Medications  Medication Dose Route Frequency Provider Last Rate Last Dose  . ARIPiprazole (ABILIFY) tablet 20 mg  20 mg Oral Daily Gonzella Lex, MD      . clonazePAM (KLONOPIN) tablet 1 mg  1 mg Oral QHS Matheus Spiker T Danniel Tones, MD      . lithium carbonate (LITHOBID) CR tablet 300 mg  300 mg Oral Q12H Eilah Common T Kordae Buonocore, MD      . nicotine (NICODERM CQ - dosed in mg/24 hours) patch 21 mg  21 mg Transdermal Daily Crystalmarie Yasin T Nautia Lem, MD      . prazosin (MINIPRESS) capsule 2 mg  2 mg Oral QHS Gonzella Lex, MD      . Derrill Memo ON 12/29/2016] venlafaxine XR (EFFEXOR-XR) 24 hr capsule 150 mg  150 mg Oral Q breakfast Gonzella Lex, MD      . zolpidem (AMBIEN) tablet 10 mg  10 mg Oral QHS Gonzella Lex, MD       Current Outpatient Prescriptions  Medication Sig Dispense Refill  . acetaminophen-codeine (TYLENOL #3) 300-30 MG tablet Take 1 tablet by mouth 3 (three) times daily as needed for pain.    . ARIPiprazole (ABILIFY) 20 MG tablet Take 1 tablet (20 mg total) by mouth daily. 30 tablet 0  . clonazePAM (KLONOPIN) 1 MG tablet Take 1 mg by mouth 2 (two) times daily.    Marland Kitchen oxyCODONE-acetaminophen (ROXICET) 5-325 MG tablet Take 1 tablet by mouth every 6 (six) hours as needed for moderate pain. 12 tablet 0  . potassium chloride (K-DUR,KLOR-CON) 10 MEQ tablet  Take 30 mEq by mouth 2 (two) times daily.    . prazosin (MINIPRESS) 2 MG capsule Take 1 capsule (2 mg total) by mouth at bedtime. (Patient taking differently: Take 2 mg by mouth 2 (two) times daily. ) 30 capsule 0  . venlafaxine XR (EFFEXOR-XR) 150 MG 24 hr capsule Take 1 capsule (150 mg total) by mouth daily with breakfast. 30 capsule 0  . zolpidem (AMBIEN) 10 MG tablet Take 10 mg by mouth at bedtime as needed for sleep.      Musculoskeletal: Strength & Muscle Tone: within normal limits Gait & Station: normal Patient leans: N/A  Psychiatric Specialty Exam: Physical Exam  Nursing note and vitals reviewed. Constitutional: She appears well-developed and well-nourished.  HENT:  Head: Normocephalic and atraumatic.  Eyes: Conjunctivae are normal. Pupils are equal, round, and reactive to light.  Neck: Normal range of motion.  Cardiovascular: Regular rhythm and  normal heart sounds.   Respiratory: Effort normal and breath sounds normal. No respiratory distress.  GI: Soft.  Musculoskeletal: Normal range of motion.  Neurological: She is alert.  Skin: Skin is warm and dry.  Psychiatric: Her affect is blunt. Her speech is delayed. She is slowed and withdrawn. Cognition and memory are impaired. She expresses impulsivity. She exhibits a depressed mood. She expresses suicidal ideation. She expresses suicidal plans. She exhibits abnormal recent memory.    Review of Systems  Constitutional: Negative.   HENT: Negative.   Eyes: Negative.   Respiratory: Negative.   Cardiovascular: Negative.   Gastrointestinal: Negative.   Musculoskeletal: Negative.   Skin: Negative.   Neurological: Negative.   Psychiatric/Behavioral: Positive for depression, substance abuse and suicidal ideas. Negative for hallucinations and memory loss. The patient is nervous/anxious. The patient does not have insomnia.     Blood pressure 116/73, pulse 85, temperature 98.2 F (36.8 C), temperature source Oral, resp. rate 20,  height _0  (1.727 m), weight 77.1 kg (170 lb), SpO2 98 %.Body mass index is 25.85 kg/m.  General Appearance: Disheveled  Eye Contact:  Fair  Speech:  Slow  Volume:  Decreased  Mood:  Depressed and Dysphoric  Affect:  Blunt  Thought Process:  Goal Directed  Orientation:  Full (Time, Place, and Person)  Thought Content:  Logical  Suicidal Thoughts:  Yes.  with intent/plan  Homicidal Thoughts:  No  Memory:  Immediate;   Good Recent;   Fair Remote;   Fair  Judgement:  Fair  Insight:  Fair  Psychomotor Activity:  Decreased  Concentration:  Concentration: Fair  Recall:  [employer] of Knowledge:  Fair  Language:  Fair  Akathisia:  No  Handed:  Right  AIMS (if indicated):     Assets:  Desire for Improvement Financial Resources/Insurance Housing Physical Health Resilience  ADL's:  Intact  Cognition:  WNL  Sleep:        Treatment Plan Summary: Daily contact with patient to assess and evaluate symptoms and progress in treatment, Medication management and Plan 46 year old woman with mood instability currently very depressed suicidal thoughts recent self injury. Active thoughts of ways to kill her self. Feeling hopeless. Abusing substances. Patient will be admitted to the psychiatric unit. I'm continuing her outpatient medicines although I am modifying them slightly. I don't think there is an indication for the Minipress to be dosed twice a day so I'm cutting that back to night time because of sedation and the same thing with the Klonopin. I am restarting lithium carbonate at a low dose of 300 mg twice a day. 15 minute checks. Labs. We will try to get the EKG while she is in the emergency room.  Disposition: Recommend psychiatric Inpatient admission when medically cleared. Supportive therapy provided about ongoing stressors.  Alethia Berthold, MD 12/28/2016 12:36 PM
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
## 2016-12-28 NOTE — Plan of Care (Signed)
Problem: Coping: Goal: Ability to cope will improve Outcome: Progressing Pt talked with writer this evening and stated she was feeling better after the talk

## 2016-12-28 NOTE — ED Notes (Signed)
Informed Toniann Fail, RN of move to order. States she will call back.

## 2016-12-28 NOTE — ED Notes (Signed)
TTS has consulted with psychiatrist.  Per Dr. Toni Amend pt dose meet inpatient criteria. TTS will seek placement

## 2016-12-28 NOTE — ED Notes (Signed)
Urine sent to lab 

## 2016-12-28 NOTE — Progress Notes (Signed)
D: Pt SI-contracts for safety. Pt is pleasant and cooperative. Pt was a little sad and tearful on approach, but pt was talkative.   A: Pt was offered support and encouragement. Pt was given scheduled medications. Pt was encourage to attend groups. Q 15 minute checks were done for safety.   R:Pt attends groups and interacts well with peers and staff. Pt is taking medication. Pt has no complaints.Pt receptive to treatment and safety maintained on unit.

## 2016-12-28 NOTE — Progress Notes (Signed)
Patients affect is sad but  cooperative during admission assessment. Patient denies SI/HI at this time. Patient stated that she hear & sees things at times. Patient informed of fall risk status, fall risk assessed "low" at this time. Patient oriented to unit/staff/room. Patient denies any questions/concerns at this time. Patient safe on unit with Q15 minute checks for safety. Skin assessment & body search done.No contraband found.

## 2016-12-28 NOTE — Tx Team (Signed)
Initial Treatment Plan 12/28/2016 5:36 PM [name] ZOX:[id number]    PATIENT STRESSORS: Financial difficulties Legal issue Substance abuse Traumatic event   PATIENT STRENGTHS: Average or above average intelligence Communication skills Supportive family/friends   PATIENT IDENTIFIED PROBLEMS: Bipolar disorder 12/28/2016  Suicidal ideation 12/28/2016                   DISCHARGE CRITERIA:  Ability to meet basic life and health needs Adequate post-discharge living arrangements Verbal commitment to aftercare and medication compliance  PRELIMINARY DISCHARGE PLAN: Attend aftercare/continuing care group Return to previous living arrangement  PATIENT/FAMILY INVOLVEMENT: This treatment plan has been presented to and reviewed with the patient, Mercedes Forbes, and/or family member,.  The patient and family have been given the opportunity to ask questions and make suggestions.  Leonarda Salon, RN 12/28/2016, 5:36 PM

## 2016-12-28 NOTE — ED Notes (Signed)
Nurse talked with patient and patient states that she has real low moods at times, and that she use to take Lamictal and it had helped her, she states that she gets down and takes drugs but that only makes it worse, patient states that her family is supportive, has 3 children and one grandchild. Patient states that she did have unresolved issues from her childhood, states that she wants to get help and back on the right medication. Patient with q 15 minute checks and camera surveillance in progress.

## 2016-12-28 NOTE — ED Notes (Addendum)
Patient is to be admitted to [facility] by Dr. Toni Amend.  Attending Physician will be Dr. Ardyth Harps.   Patient has been assigned to room 303A, by [facility] Charge Nurse [place].   Intake Paper Work has been signed and placed on patient chart.  ER staff is aware of the admission [facility] ER Sect.ER MD; Toniann Fail Patient's Nurse & Zella Ball Patient Access).

## 2016-12-28 NOTE — ED Notes (Signed)
Patient here voluntary. Sutures noted to left anterior forearm. No drainage or signs of infection noted. Patient states she cut herself a few days ago in attempts to harm herself. Patient states, "I just don't feel good." When asked how patient would hurt herself patient states, "I would cut myself or hang myself". Patient verbally contracted with this RN to not attempt to harm herself while here. Patient calm and cooperative. Makes eye contact. Patient states she drinks "every now and then" and uses cocaine and marijuana daily with last use being yesterday.
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
## 2016-12-28 NOTE — ED Provider Notes (Addendum)
[facility] Emergency Department Provider Note  ____________________________________________   I have reviewed the triage vital signs and the nursing notes.   HISTORY  Chief Complaint Suicidal    HPI Mercedes Forbes is a 46 y.o. female resents today complaining of suicidal thoughts. Patient does have a history of schizoaffective disorder and polysubstance abuse. She was seen here a few days ago for a laceration to her wrist. At that time she said it was an accident. When I press her, this time, she states that she did it on purpose. She has ongoing suicidal thoughts. She denies taking an overdose or injuring herself since that event.    Past Medical History:  Diagnosis Date  . Arthritis   . Bipolar 1 disorder (HCC)   . Headache    migraines  . Personality disorder   . Polysubstance abuse   . Schizophrenia [facility])     Patient Active Problem List   Diagnosis Date Noted  . Schizoaffective disorder (HCC) 08/03/2016  . Opiate abuse, episodic 07/22/2016  . PTSD (post-traumatic stress disorder) 04/04/2016  . Tobacco use disorder 04/03/2016  . Bipolar 2 disorder, major depressive episode (HCC) 04/02/2016  . Cocaine use disorder, moderate, dependence (HCC) 04/02/2016  . Cannabis use disorder, moderate, dependence (HCC) 04/02/2016    Past Surgical History:  Procedure Laterality Date  . KNEE ARTHROSCOPY WITH PATELLA RECONSTRUCTION     left patella removed  . wrists Bilateral     Prior to Admission medications   Medication Sig Start Date End Date Taking? Authorizing Provider  acetaminophen-codeine (TYLENOL #3) 300-30 MG tablet Take 1 tablet by mouth 3 (three) times daily as needed for pain. 08/11/16   Historical Provider, MD  ARIPiprazole (ABILIFY) 20 MG tablet Take 1 tablet (20 mg total) by mouth daily. 07/26/16   [name], MD  clonazePAM (KLONOPIN) 1 MG tablet Take 1 mg by mouth 2 (two) times daily. 08/08/16   Historical Provider, MD   oxyCODONE-acetaminophen (ROXICET) 5-325 MG tablet Take 1 tablet by mouth every 6 (six) hours as needed for moderate pain. 12/20/16   Joni Reining, PA-C  potassium chloride (K-DUR,KLOR-CON) 10 MEQ tablet Take 30 mEq by mouth 2 (two) times daily.    Historical Provider, MD  prazosin (MINIPRESS) 2 MG capsule Take 1 capsule (2 mg total) by mouth at bedtime. Patient taking differently: Take 2 mg by mouth 2 (two) times daily.  07/25/16   [name], MD  venlafaxine XR (EFFEXOR-XR) 150 MG 24 hr capsule Take 1 capsule (150 mg total) by mouth daily with breakfast. 07/26/16   [name], MD  zolpidem (AMBIEN) 10 MG tablet Take 10 mg by mouth at bedtime as needed for sleep.    Historical Provider, MD    Allergies Aspirin; Flurbiprofen; and Nsaids  History reviewed. No pertinent family history.  Social History Social History  Substance Use Topics  . Smoking status: Current Every Day Smoker    Packs/day: 1.00  . Smokeless tobacco: Never Used     Comment: Nicotine patch was ordered  . Alcohol use No    Review of Systems Constitutional: No fever/chills Eyes: No visual changes. ENT: No sore throat. No stiff neck no neck pain Cardiovascular: Denies chest pain. Respiratory: Denies shortness of breath. Gastrointestinal:   no vomiting.  No diarrhea.  No constipation. Genitourinary: Negative for dysuria. Musculoskeletal: Negative lower extremity swelling Skin: Negative for rash. Neurological: Negative for severe headaches, focal weakness or numbness. 10-point ROS otherwise negative.  ____________________________________________   PHYSICAL EXAM:  VITAL SIGNS: ED Triage Vitals  Enc Vitals Group     BP 12/28/16 0751 116/73     Pulse Rate 12/28/16 0751 85     Resp 12/28/16 0751 20     Temp 12/28/16 0751 98.2 F (36.8 C)     Temp Source 12/28/16 0751 Oral     SpO2 12/28/16 0751 98 %     Weight 12/28/16 0751 170 lb (77.1 kg)     Height 12/28/16 0751  (1.727  m)     Head Circumference --      Peak Flow --      Pain Score 12/28/16 0759 0     Pain Loc --      Pain Edu? --      Excl. in GC? --     Constitutional: Alert and oriented. Well appearing and in no acute distress. Eyes: Conjunctivae are normal. PERRL. EOMI. Head: Atraumatic. Nose: No congestion/rhinnorhea. Mouth/Throat: Mucous membranes are moist.  Oropharynx non-erythematous. Neck: No stridor.   Nontender with no meningismus Cardiovascular: Normal rate, regular rhythm. Grossly normal heart sounds.  Good peripheral circulation. Respiratory: Normal respiratory effort.  No retractions. Lungs CTAB. Abdominal: Soft and nontender. No distention. No guarding no rebound Back:  There is no focal tenderness or step off.  there is no midline tenderness there are no lesions noted. there is no CVA tenderness Musculoskeletal: No lower extremity tenderness, no upper extremity tenderness. No joint effusions, no DVT signs strong distal pulses no edema Neurologic:  Normal speech and language. No gross focal neurologic deficits are appreciated.  Skin:  Skin is warm, dry and intact. All laceration clean dry and intact suture in place on wrist. Psychiatric: Mood and affect are normal. Speech and behavior are normal.  ____________________________________________   LABS (all labs ordered are listed, but only abnormal results are displayed)  Labs Reviewed  COMPREHENSIVE METABOLIC PANEL - Abnormal; Notable for the following:       Result Value   Potassium 3.2 (*)    Glucose, Bld 103 (*)    Calcium 8.8 (*)    ALT 13 (*)    All other components within normal limits  ACETAMINOPHEN LEVEL - Abnormal; Notable for the following:    Acetaminophen (Tylenol), Serum <10 (*)    All other components within normal limits  CBC - Abnormal; Notable for the following:    RDW 15.0 (*)    All other components within normal limits  URINE DRUG SCREEN, QUALITATIVE ([facility] ONLY) - Abnormal; Notable for the following:     Cocaine Metabolite,Ur Deport POSITIVE (*)    Cannabinoid 50 Ng, Ur Neibert POSITIVE (*)    All other components within normal limits  ETHANOL  SALICYLATE LEVEL  POC URINE PREG, ED  POCT PREGNANCY, URINE   ____________________________________________  EKG  I personally interpreted any EKGs ordered by me or triage Late entry: EKG obtained at 1440 p.m., rate 75 no acute ST elevation or depression normal axis unremarkable EKG ____________________________________________  RADIOLOGY  I reviewed any imaging ordered by me or triage that were performed during my shift and, if possible, patient and/or family made aware of any abnormal findings. ____________________________________________   PROCEDURES  Procedure(s) performed: None  Procedures  Critical Care performed: None  ____________________________________________   INITIAL IMPRESSION / ASSESSMENT AND PLAN / ED COURSE  Pertinent labs & imaging results that were available during my care of the patient were reviewed by me and considered in my medical decision making (see chart for details).  Patient  here with ongoing suicidal thoughts. She is voluntary. She would contract for safety while here but does not feel she is safe to go home. We will evaluate her from a psychiatric point of view. Patient did cut herself and lie to [place] about it already so I suspect she will not be safe for discharge.    ____________________________________________   FINAL CLINICAL IMPRESSION(S) / ED DIAGNOSES  Final diagnoses:  None      This chart was dictated using voice recognition software.  Despite best efforts to proofread,  errors can occur which can change meaning.      Jeanmarie Plant, MD 12/28/16 [id number]    Jeanmarie Plant, MD 12/28/16 [phone]

## 2016-12-28 NOTE — ED Notes (Signed)
Pt being reviewed for possible admission to [facility]. H&P and Assessment have been faxed to the [facility] Unit for the charge nurse to review and provide bed assignment.      Nicole Oneda Duffett, MS, NCC, LPCA Therapeutic Triage Specialist  

## 2016-12-28 NOTE — ED Triage Notes (Signed)
Patient arrives to [facility] ED with suicidal ideation. Patient states "Im just not feeling right". No stressors identified by patient in triage.  Patient states hanging as potential methodology. + Plan.  Seen by [facility], Dr. Bard Herbert for same September or October. Patient on effexor, abilify, minipress, clonopin, Remus Loffler and is currently taking as prescribed.   Patient is requesting "mood stabilizer"  Crystal NT in room during interview and dress-out

## 2016-12-28 NOTE — ED Notes (Signed)
Called report to [facility] RN, Patient is being transferred to room 303.

## 2016-12-28 NOTE — ED Notes (Signed)
Dr. Clapacs talking with Patient.  

## 2016-12-29 DIAGNOSIS — F4310 Post-traumatic stress disorder, unspecified: Secondary | ICD-10-CM

## 2016-12-29 DIAGNOSIS — F1110 Opioid abuse, uncomplicated: Secondary | ICD-10-CM

## 2016-12-29 DIAGNOSIS — F1220 Cannabis dependence, uncomplicated: Secondary | ICD-10-CM

## 2016-12-29 DIAGNOSIS — F1420 Cocaine dependence, uncomplicated: Secondary | ICD-10-CM

## 2016-12-29 LAB — TSH: TSH: 2.72 u[IU]/mL (ref 0.350–4.500)

## 2016-12-29 LAB — LIPID PANEL
Cholesterol: 219 mg/dL — ABNORMAL HIGH (ref 0–200)
HDL: 55 mg/dL (ref >40)
LDL CALC: 124 mg/dL — AB (ref 0–99)
TRIGLYCERIDES: 198 mg/dL — AB (ref <150)
Total CHOL/HDL Ratio: 4 RATIO
VLDL: 40 mg/dL (ref 0–40)

## 2016-12-29 MED ORDER — LITHIUM CARBONATE ER 300 MG PO TBCR
600.0000 mg | EXTENDED_RELEASE_TABLET | Freq: Every day | ORAL | Status: DC
  Administered 2016-12-29 – 2016-12-31 (×3): 600 mg via ORAL
  Filled 2016-12-29 (×3): qty 2

## 2016-12-29 NOTE — Progress Notes (Signed)
D: Patient  Voice of wanting to Die , feels unhappy , dose not have a plan. Isolative to room  Affect sad and flat.  Patient stated slept fair last night .Stated appetite fair and energy level low. Stated concentration is poor . Stated on Depression scale  8 , hopeless 8  and anxiety 9 .( low 0-10 high) Denies suicidal  homicidal ideations  .  No auditory hallucinations   Appropriate ADL'S. Interacting with peers and staff. Patient compliant of knee and back pain , but stated she didn't need anything.  A: Encourage patient participation with unit programming . Instruction  Given on  Medication , verbalize understanding. Voice of trying to thing positive . Patient encourage to go  Outside  Patient did so . R: Voice no other concerns. Staff continue to monitor

## 2016-12-29 NOTE — Progress Notes (Signed)
[facility] MD Progress Note  12/29/2016 2:17 PM Mercedes Forbes  MRN:  [id number]   Subjective:   The patient does admit to some regret with regards to cutting herself on her left upper extremity. Wound does appear to be healing fairly well. No bleeding or signs of infection. The patient was having some auditory and visual hallucinations yesterday per nursing but denies any auditory or visual hallucinations today. She says her mood is still depressed but no current active or passive suicidal thoughts or psychotic symptoms. The patient reports that she did not sleep well last night as she did have nightmares. She denies any change in appetite, weight gain or weight loss. She is anxious about her probation violations. She still cannot identify specific triggers for relapse on cocaine and marijuana other than financial problems. She does still have some sadness and is grieving for her husband who committed suicide in 2015. The patient is resistant to going to residential substance abuse treatment because she does not want to leave her dog. She denies any current somatic complaints. She has been fairly isolative to her room today and is not attending groups. She was encouraged to try and get out of her room and socialize with peers and staff. Supportive psychotherapy provided and times spent discussing the stages of grief and encouraging her to honor her husband's memory. Times spent also discussing the need to abstain from all illicit drugs as they may worsen anxiety and mood symptoms. Times spent encouraging her to enter a meaningful recovery program at the time of discharge.   Past Psychiatric History: There were several psychiatric hospitalizations at [facility] and [facility] in 2014 and 2017. She has diagnosis of bipolar disorder, major depression, PTSD, panic disorder. She's been tried on numerous medications but likes her current regimen the most. There were several suicide attempts by  overdose  Family Psychiatric History: She denies any history of any mental illness or substance use in the family.  Social History: She is disabled from mental illness.  She has 3 children Ages 34, 27 and 43. She has a good relationship with them. The 48 year old is in college and is doing very well, patient says he is in the Dean's list   Patient lives in a house with a couple and she rents a room to.   Patient has college education  Substance Abuse History: The patient was positive for both marijuana and cocaine. She does not drink alcohol heavily. She relapsed on marijuana and cocaine recently. She denies any opiate or stimulant use. She denies any IV drug use.    Legal History: As far as her legal history she is currently on probation for possession of a stolen firearm. She by a letter to probation in November as her urine toxicology was positive for illicit substances. She was in jail in February for 24 days. Patient tells me her probation officer wants her to do an additional 60 days.   Principal Problem: Bipolar 2 disorder, major depressive episode (HCC) Diagnosis:   Patient Active Problem List   Diagnosis Date Noted  . Bipolar 2 disorder, major depressive episode (HCC) [F31.81] 04/02/2016    Priority: High  . 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 PTSD (post-traumatic stress disorder) [F43.10] 04/04/2016  . Tobacco use disorder [F17.200] 04/03/2016  . Cocaine use disorder, moderate, dependence (HCC) [F14.20] 04/02/2016  . Cannabis use disorder, moderate, dependence (HCC) [F12.20] 04/02/2016   Total Time spent with patient: 30 minutes   Past Medical History:  Past Medical History:  Diagnosis Date  . Arthritis   . Bipolar 1 disorder (HCC)   . Headache    migraines  . Personality disorder   . Polysubstance abuse   . Schizophrenia [facility])     Past Surgical History:  Procedure Laterality Date  . KNEE ARTHROSCOPY WITH PATELLA RECONSTRUCTION     left patella  removed  . wrists Bilateral    Family History: History reviewed. No pertinent family history.  Social History:  History  Alcohol Use No     History  Drug Use  . Types: Cocaine    Comment: last 1 month ago    Social History   Social History  . Marital status: Widowed    Spouse name: N/A  . Number of children: N/A  . Years of education: N/A   Social History Main Topics  . Smoking status: Current Every Day Smoker    Packs/day: 1.00  . Smokeless tobacco: Never Used     Comment: Nicotine patch was ordered  . Alcohol use No  . Drug use: Yes    Types: Cocaine     Comment: last 1 month ago  . Sexual activity: No   Other Topics Concern  . None   Social History Narrative  . None     Sleep: Good  Appetite:  Good  Current Medications: Current Facility-Administered Medications  Medication Dose Route Frequency Provider Last Rate Last Dose  . acetaminophen (TYLENOL) tablet 650 mg  650 mg Oral Q6H PRN Audery Amel, MD   650 mg at 12/28/16 2139  . alum & mag hydroxide-simeth (MAALOX/MYLANTA) 200-200-20 MG/5ML suspension 30 mL  30 mL Oral Q4H PRN Audery Amel, MD      . ARIPiprazole (ABILIFY) tablet 20 mg  20 mg Oral Daily Audery Amel, MD   20 mg at 12/29/16 0747  . chlordiazePOXIDE (LIBRIUM) capsule 25 mg  25 mg Oral QHS [name], MD   25 mg at 12/28/16 2139  . hydrOXYzine (ATARAX/VISTARIL) tablet 50 mg  50 mg Oral Q4H PRN [name], MD      . lithium carbonate (LITHOBID) CR tablet 600 mg  600 mg Oral QHS Darliss Ridgel, MD      . magnesium hydroxide (MILK OF MAGNESIA) suspension 30 mL  30 mL Oral Daily PRN Audery Amel, MD      . nicotine (NICODERM CQ - dosed in mg/24 hours) patch 21 mg  21 mg Transdermal Daily Audery Amel, MD   21 mg at 12/29/16 0747  . prazosin (MINIPRESS) capsule 2 mg  2 mg Oral QHS Audery Amel, MD   2 mg at 12/28/16 2139  . traZODone (DESYREL) tablet 100 mg  100 mg Oral QHS [name], MD   100  mg at 12/28/16 2140  . venlafaxine XR (EFFEXOR-XR) 24 hr capsule 150 mg  150 mg Oral Q breakfast Audery Amel, MD   150 mg at 12/29/16 0747    Lab Results:  Results for orders placed or performed during the hospital encounter of 12/28/16 (from the past 48 hour(s))  Lipid panel     Status: Abnormal   Collection Time: 12/29/16  7:53 AM  Result Value Ref Range   Cholesterol 219 (H) 0 - 200 mg/dL   Triglycerides 161 (H) <150 mg/dL   HDL 55 >09 mg/dL   Total CHOL/HDL Ratio 4.0 RATIO   VLDL 40 0 - 40 mg/dL   LDL Cholesterol 604 (H) 0 - 99 mg/dL  Comment:        Total Cholesterol/HDL:CHD Risk Coronary Heart Disease Risk Table                     Men   Women  1/2 Average Risk   3.4   3.3  Average Risk       5.0   4.4  2 X Average Risk   9.6   7.1  3 X Average Risk  23.4   11.0        Use the calculated Patient Ratio above and the CHD Risk Table to determine the patient's CHD Risk.        ATP III CLASSIFICATION (LDL):  <100     mg/dL   Optimal  841-324  mg/dL   Near or Above                    Optimal  130-159  mg/dL   Borderline  401-027  mg/dL   High  >253     mg/dL   Very High   TSH     Status: None   Collection Time: 12/29/16  7:53 AM  Result Value Ref Range   TSH 2.720 0.350 - 4.500 uIU/mL    Comment: Performed by a 3rd Generation assay with a functional sensitivity of <=0.01 uIU/mL.    Blood Alcohol level:  Lab Results  Component Value Date   ETH <5 12/28/2016   ETH <5 08/03/2016    Metabolic Disorder Labs: Lab Results  Component Value Date   HGBA1C 5.3 07/23/2016   MPG 105 07/23/2016   Lab Results  Component Value Date   PROLACTIN 16.0 07/23/2016   PROLACTIN 12.0 04/03/2016   Lab Results  Component Value Date   CHOL 219 (H) 12/29/2016   TRIG 198 (H) 12/29/2016   HDL 55 12/29/2016   CHOLHDL 4.0 12/29/2016   VLDL 40 12/29/2016   LDLCALC 124 (H) 12/29/2016   LDLCALC 126 (H) 07/23/2016    Musculoskeletal: Strength & Muscle Tone: within normal  limits Gait & Station: normal Patient leans: N/A  Psychiatric Specialty Exam: Physical Exam  Skin:  She does have a cut on her left wrist with stitches. No bleeding or signs of infection.    Review of Systems  Constitutional: Negative.   HENT: Negative.   Eyes: Negative.   Respiratory: Negative.   Cardiovascular: Negative.   Gastrointestinal: Negative.  Negative for heartburn, nausea and vomiting.  Genitourinary: Negative.  Negative for dysuria, frequency, hematuria and urgency.  Musculoskeletal: Negative for back pain, myalgias and neck pain.       She does have stitches on her left wrist from cutting herself. No bleeding or signs of infection.  Skin: Negative.   Neurological: Negative.  Negative for dizziness, tingling, tremors, sensory change, seizures, loss of consciousness and headaches.  Endo/Heme/Allergies: Negative.  Does not bruise/bleed easily.    Blood pressure 102/72, pulse 72, temperature 97.8 F (36.6 C), temperature source Oral, resp. rate 20, height  (1.727 m), weight 80.7 kg (178 lb), SpO2 99 %.Body mass index is 27.06 kg/m.  General Appearance: Disheveled  Eye Contact:  Good  Speech:  Clear and Coherent and Normal Rate  Volume:  Normal  Mood:  Depressed  Affect:  Depressed  Thought Process:  Coherent, Goal Directed and Linear  Orientation:  Full (Time, Place, and Person)  Thought Content:  Logical  Suicidal Thoughts:  No  Homicidal Thoughts:  No  Memory:  Immediate;   Good  Recent;   Good Remote;   Good  Judgement:  Impaired  Insight:  Fair  Psychomotor Activity:  Normal  Concentration:  Concentration: Good and Attention Span: Good  Recall:  Good  Fund of Knowledge:  Good  Language:  Good  Akathisia:  No  Handed:  Right  AIMS (if indicated):     Assets:  Communication Skills Housing Physical Health  ADL's:  Intact  Cognition:  WNL  Sleep:  Number of Hours: 7.45     Treatment Plan Summary:  Patient is a 46 year old female with a  history of bipolar disorder, PTSD and substance abuse. She presents to our emergency department for the second time this month with suicidal ideations. Patient has been using crack and cocaine heavily.  She has an extensive past psychiatric history with multiple hospitalizations and suicidal attempts. Husband committed suicide a few years ago.   Bipolar disorder: Patient will be continued on Abilify 20 mg a day, Effexor 150 mg a day. In addition lithium was started while patient was in the emergency department. She will be continued on 600 mg of lithium CR at bedtime will need to check lithium level and BMP and one week's time. Hemoglobin A1c is November was 5.3. Total cholesterol was 219. TSH was 2.7. Will check prolactin level. EKG showed a QTC of 455.   Insomnia: Will be targeted with trazodone 100 mg panel daily at bedtime  PTSD: Continue Effexor 150 mg a day and Minipress 2 mg daily at bedtime. Patient also on trazodone 100 mg daily at bedtime  Cocaine, alcohol and marijuana use disorder: Patient is interested in going to intensive outpatient substance abuse at [facility] upon discharge. She was advised to abstain from marijuana and all illicit drugs as they may worsen mood symptoms. She denies any IV drug use.  Benzodiazepine taper: Patient has been on clonazepam for many years. Due to her ongoing substance abuse my plan will be to taper her off benzodiazepines. She was started on Librium 25 mg daily at bedtime which I plan to discontinue prior to discharge.  Nicotine use disorder we will order nicotine patch at 21 mg a day  Diet regular  Precautions every 15 minute checks  Dispo: will return home once stable  F/u [facility] for psychotropic medication management as well as CST services.  Records from prior hospitalization have been reviewed by Dr Ardyth Harps. Patient was here a year ago.    Daily contact with patient to assess and evaluate symptoms and progress in treatment and  Medication management  Levora Angel, MD 12/29/2016, 2:17 PM

## 2016-12-29 NOTE — Progress Notes (Signed)
D: Pt SI-contracts for safety. Pt is cooperative with treatment thus far. Pt was pleasant on approach, interacted well with peers and staff.   A: Pt was offered support and encouragement. Pt was given scheduled medications. Pt was encourage to attend groups. Q 15 minute checks were done for safety.   R: Pt attended groups and interactedwell with peers and staff. Pt is taking medications. Pt had no complaints.Pt receptive to treatment and safety maintained on unit.

## 2016-12-29 NOTE — Plan of Care (Signed)
Problem: Coping: Goal: Ability to verbalize feelings will improve Outcome: Progressing Handout given on coping skills , encourage to work on her own

## 2016-12-29 NOTE — [facility] Group Notes (Signed)
[facility] Group Notes:  (Nursing/MHT/Case Management/Adjunct)  Date:  12/29/2016  Time:  12:41 AM  Type of Therapy:  Group Therapy  Participation Level:  Did Not Attend   Veva Holes 12/29/2016, 12:41 AM

## 2016-12-29 NOTE — [facility] Group Notes (Signed)
[facility] LCSW Group Therapy  12/29/2016 2:20 PM  Type of Therapy:  Group Therapy  Participation Level:  Patient did not attend group. CSW invited patient to group.   Summary of Progress/Problems: Self esteem: Patients discussed self esteem and how it impacts them. They discussed what aspects in their lives has influenced their self esteem. They were challenged to identify changes that are needed in order to improve self esteem. Patients participated in activity where they had to identify positive adjectives they felt described their personality. Patients shared with the group on the following areas: Things I am good at, What I like about my appearance, I've helped others by, What I value the most, compliments I have received, challenges I have overcome, thing that make me unique, and Times I've made others happy.   Mercedes Forbes G. Garnette Czech MSW, LCSWA 12/29/2016, 2:21 PM

## 2016-12-30 MED ORDER — HYDROXYZINE HCL 50 MG PO TABS
50.0000 mg | ORAL_TABLET | Freq: Three times a day (TID) | ORAL | Status: DC | PRN
  Administered 2016-12-30 – 2016-12-31 (×4): 50 mg via ORAL
  Filled 2016-12-30 (×4): qty 1

## 2016-12-30 NOTE — [facility] Suicide Risk Assessment (Signed)
[facility] INPATIENT:  Family/Significant Other Suicide Prevention Education  Suicide Prevention Education:  Patient Refusal for Family/Significant Other Suicide Prevention Education: The patient Mercedes Forbes has refused to provide written consent for family/significant other to be provided Family/Significant Other Suicide Prevention Education during admission and/or prior to discharge.  Physician notified.  Que Meneely G. Garnette Czech MSW, LCSWA 12/30/2016, 10:46 AM

## 2016-12-30 NOTE — [facility] Group Notes (Signed)
[facility] Group Notes:  (Nursing/MHT/Case Management/Adjunct)  Date:  12/30/2016  Time:  1:40 AM  Type of Therapy:  Psychoeducational Skills  Participation Level:  Minimal  Participation Quality:  Appropriate  Affect:  Appropriate  Cognitive:  Alert  Insight:  Good  Engagement in Group:  Supportive  Modes of Intervention:  Support  Summary of Progress/Problems:  Mercedes Forbes 12/30/2016, 1:40 AM

## 2016-12-30 NOTE — Progress Notes (Signed)
Pt presents with sad, flat affect. Denies SI, HI, AVH. Endorses depression but states improving. c/o pain to back. Prn given with good relief. Increased anxiety levels. Prn given with good relief.  Encouragement and support offered. Safety checks maintained. Pt receptive and remains safe on unit with q 15 min checks.
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
## 2016-12-30 NOTE — B[facility] Group Notes (Addendum)
BHH LCSW Group Therapy  12/30/2016 2:31 PM  Type of Therapy:  Group Therapy  Participation Level:  None  Summary of Progress/Problems: Self-responsibility/accountability- Patients discussed self responsibility/accountability  and how it impacts them. Patients were asked to define these concepts in their own words. They discussed taking ownership of their actions and the challenges they have with taking accountability for their self. CSW discussed how to improve their communication with others. Examples were provided of each. They were challenged to identify changes that are needed in order to improve self responsibility/accountability. CSW provided inspirational quotes that focused on the patients taking accountability for actions both good and bad. Patient arrived to group with less than five minutes left in group. Patient was attentive but did not engage in the group discussion.   Mercedes Eckert G. Garnette Czech MSW, LCSWA 12/30/2016, 2:32 PM

## 2016-12-30 NOTE — Plan of Care (Signed)
Problem: Education: Goal: Emotional status will improve Outcome: Progressing Pt reports depression decreasing. Pt noted in milieu, talking with peers, smiling

## 2016-12-30 NOTE — Progress Notes (Signed)
[facility] MD Progress Note  12/30/2016 2:08 PM Mercedes Forbes  MRN:  [id number]   Subjective:  Mercedes Forbes Mercedes Forbes is a 46 year old Caucasian female who presented voluntarily to the emergency room on April 13 with worsening depressive symptoms, suicidal thoughts and a plan to hang herself.  The patient reports that she has been feeling a little better today in terms of her mood. She did go to the wrap up group yesterday evening. She slept much better last night, 7.5 hours but has been reclusive to her room this morning and is not interacting a lot with staff or peers. The patient reports ongoing problems with anxiety related to legal charges. She cannot identify other specific triggers for anxiety or depression. She denies any current active or passive suicidal thoughts but did have passive suicidal thoughts yesterday afternoon. She says the suicidal thoughts, and go. She did talk to her mom and her roommate yesterday which went well. She denies any psychotic symptoms. Appetite is good. She is not attending any groups today and has been isolative to her room. She denies any physical adverse side effects associated with the lithium, Effexor, or Abilify.  Time spent educating the patient about negative consequences of illicit drug use on anxiety and mood symptoms.   Past Psychiatric History: There were several psychiatric hospitalizations at [facility] and [facility] in 2014 and 2017. She has diagnosis of bipolar disorder, major depression, PTSD, panic disorder. She's been tried on numerous medications but likes her current regimen the most. There were several suicide attempts by overdose  Family Psychiatric History: She denies any history of any mental illness or substance use in the family.  Social History: She is disabled from mental illness.  She has 3 children Ages 46, 28 and 42. She has a good relationship with them. The 48 year old is in college and is doing very well, patient says he is in  the Dean's list   Patient lives in a house with a couple and she rents a room to.   Patient has college education  Substance Abuse History: The patient was positive for both marijuana and cocaine. She does not drink alcohol heavily. She relapsed on marijuana and cocaine recently. She denies any opiate or stimulant use. She denies any IV drug use.    Legal History: As far as her legal history she is currently on probation for possession of a stolen firearm. She by a letter to probation in November as her urine toxicology was positive for illicit substances. She was in jail in February for 24 days. Patient tells me her probation officer wants her to do an additional 60 days.   Principal Problem: Bipolar 2 disorder, major depressive episode (HCC) Diagnosis:   Patient Active Problem List   Diagnosis Date Noted  . Bipolar 2 disorder, major depressive episode (HCC) [F31.81] 04/02/2016    Priority: High  . 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 PTSD (post-traumatic stress disorder) [F43.10] 04/04/2016  . Tobacco use disorder [F17.200] 04/03/2016  . Cocaine use disorder, moderate, dependence (HCC) [F14.20] 04/02/2016  . Cannabis use disorder, moderate, dependence (HCC) [F12.20] 04/02/2016   Total Time spent with patient: 30 minutes   Past Medical History:  Past Medical History:  Diagnosis Date  . Arthritis   . Bipolar 1 disorder (HCC)   . Headache    migraines  . Personality disorder   . Polysubstance abuse   . Schizophrenia [facility])     Past Surgical History:  Procedure Laterality Date  .  KNEE ARTHROSCOPY WITH PATELLA RECONSTRUCTION     left patella removed  . wrists Bilateral    Family History: History reviewed. No pertinent family history.  Social History:  History  Alcohol Use No     History  Drug Use  . Types: Cocaine    Comment: last 1 month ago    Social History   Social History  . Marital status: Widowed    Spouse name: N/A  . Number of children: N/A   . Years of education: N/A   Social History Main Topics  . Smoking status: Current Every Day Smoker    Packs/day: 1.00  . Smokeless tobacco: Never Used     Comment: Nicotine patch was ordered  . Alcohol use No  . Drug use: Yes    Types: Cocaine     Comment: last 1 month ago  . Sexual activity: No   Other Topics Concern  . None   Social History Narrative  . None     Sleep: Good  Appetite:  Good  Current Medications: Current Facility-Administered Medications  Medication Dose Route Frequency Provider Last Rate Last Dose  . acetaminophen (TYLENOL) tablet 650 mg  650 mg Oral Q6H PRN Audery Amel, MD   650 mg at 12/30/16 0815  . alum & mag hydroxide-simeth (MAALOX/MYLANTA) 200-200-20 MG/5ML suspension 30 mL  30 mL Oral Q4H PRN Audery Amel, MD      . ARIPiprazole (ABILIFY) tablet 20 mg  20 mg Oral Daily Audery Amel, MD   20 mg at 12/30/16 0816  . chlordiazePOXIDE (LIBRIUM) capsule 25 mg  25 mg Oral QHS [name], MD   25 mg at 12/29/16 2111  . hydrOXYzine (ATARAX/VISTARIL) tablet 50 mg  50 mg Oral TID PRN Darliss Ridgel, MD   50 mg at 12/30/16 1349  . lithium carbonate (LITHOBID) CR tablet 600 mg  600 mg Oral QHS Darliss Ridgel, MD   600 mg at 12/29/16 2112  . magnesium hydroxide (MILK OF MAGNESIA) suspension 30 mL  30 mL Oral Daily PRN Audery Amel, MD      . nicotine (NICODERM CQ - dosed in mg/24 hours) patch 21 mg  21 mg Transdermal Daily Audery Amel, MD   21 mg at 12/30/16 0816  . prazosin (MINIPRESS) capsule 2 mg  2 mg Oral QHS Audery Amel, MD   2 mg at 12/29/16 2111  . traZODone (DESYREL) tablet 100 mg  100 mg Oral QHS [name], MD   100 mg at 12/29/16 2111  . venlafaxine XR (EFFEXOR-XR) 24 hr capsule 150 mg  150 mg Oral Q breakfast Audery Amel, MD   150 mg at 12/30/16 1610    Lab Results:  Results for orders placed or performed during the hospital encounter of 12/28/16 (from the past 48 hour(s))  Hemoglobin A1c     Status:  None   Collection Time: 12/29/16  7:53 AM  Result Value Ref Range   Hgb A1c MFr Bld 5.0 4.8 - 5.6 %    Comment: (NOTE)         Pre-diabetes: 5.7 - 6.4         Diabetes: >6.4         Glycemic control for adults with diabetes: <7.0    Mean Plasma Glucose 97 mg/dL    Comment: (NOTE) Performed At: [facility] [address] [place], [place] [id number] Mila Homer MD JX:[id number]   Lipid panel     Status:  Abnormal   Collection Time: 12/29/16  7:53 AM  Result Value Ref Range   Cholesterol 219 (H) 0 - 200 mg/dL   Triglycerides 161 (H) <150 mg/dL   HDL 55 >09 mg/dL   Total CHOL/HDL Ratio 4.0 RATIO   VLDL 40 0 - 40 mg/dL   LDL Cholesterol 604 (H) 0 - 99 mg/dL    Comment:        Total Cholesterol/HDL:CHD Risk Coronary Heart Disease Risk Table                     Men   Women  1/2 Average Risk   3.4   3.3  Average Risk       5.0   4.4  2 X Average Risk   9.6   7.1  3 X Average Risk  23.4   11.0        Use the calculated Patient Ratio above and the CHD Risk Table to determine the patient's CHD Risk.        ATP III CLASSIFICATION (LDL):  <100     mg/dL   Optimal  540-981  mg/dL   Near or Above                    Optimal  130-159  mg/dL   Borderline  191-478  mg/dL   High  >295     mg/dL   Very High   TSH     Status: None   Collection Time: 12/29/16  7:53 AM  Result Value Ref Range   TSH 2.720 0.350 - 4.500 uIU/mL    Comment: Performed by a 3rd Generation assay with a functional sensitivity of <=0.01 uIU/mL.    Blood Alcohol level:  Lab Results  Component Value Date   ETH <5 12/28/2016   ETH <5 08/03/2016    Metabolic Disorder Labs: Lab Results  Component Value Date   HGBA1C 5.0 12/29/2016   MPG 97 12/29/2016   MPG 105 07/23/2016   Lab Results  Component Value Date   PROLACTIN 16.0 07/23/2016   PROLACTIN 12.0 04/03/2016   Lab Results  Component Value Date   CHOL 219 (H) 12/29/2016   TRIG 198 (H) 12/29/2016   HDL 55 12/29/2016   CHOLHDL 4.0  12/29/2016   VLDL 40 12/29/2016   LDLCALC 124 (H) 12/29/2016   LDLCALC 126 (H) 07/23/2016    Musculoskeletal: Strength & Muscle Tone: within normal limits Gait & Station: normal Patient leans: N/A  Psychiatric Specialty Exam: Physical Exam  Skin:  She does have a cut on her left wrist with stitches. No bleeding or signs of infection.    Review of Systems  Constitutional: Negative.  Negative for chills, fever and weight loss.  HENT: Negative.  Negative for congestion, ear pain, hearing loss, sore throat and tinnitus.   Eyes: Negative.  Negative for blurred vision, double vision and photophobia.  Respiratory: Negative for cough, sputum production and wheezing.   Cardiovascular: Negative.  Negative for chest pain and palpitations.  Gastrointestinal: Negative.  Negative for heartburn, nausea and vomiting.  Genitourinary: Negative.  Negative for dysuria, frequency, hematuria and urgency.  Musculoskeletal: Negative for back pain, myalgias and neck pain.       She does have stitches on her left wrist from cutting herself. No bleeding or signs of infection.  Skin: Negative.  Negative for itching and rash.  Neurological: Negative.  Negative for dizziness, tingling, tremors, sensory change, seizures, loss of consciousness and headaches.  Endo/Heme/Allergies: Negative.  Does not bruise/bleed easily.    Blood pressure 106/67, pulse 80, temperature 97.9 F (36.6 C), temperature source Oral, resp. rate 18, height  (1.727 m), weight 80.7 kg (178 lb), SpO2 99 %.Body mass index is 27.06 kg/m.  General Appearance: Disheveled  Eye Contact:  Good  Speech:  Clear and Coherent and Normal Rate  Volume:  Normal  Mood:  Depressed  Affect:  Depressed  Thought Process:  Coherent, Goal Directed and Linear  Orientation:  Full (Time, Place, and Person)  Thought Content:  Logical  Suicidal Thoughts:  No  Homicidal Thoughts:  No  Memory:  Immediate;   Good Recent;   Good Remote;   Good   Judgement:  Impaired  Insight:  Fair  Psychomotor Activity:  Normal  Concentration:  Concentration: Good and Attention Span: Good  Recall:  Good  Fund of Knowledge:  Good  Language:  Good  Akathisia:  No  Handed:  Right  AIMS (if indicated):     Assets:  Communication Skills Housing Physical Health  ADL's:  Intact  Cognition:  WNL  Sleep:  Number of Hours: 7.5     Treatment Plan Summary:  Patient is a 46 year old female with a history of bipolar disorder, PTSD and substance abuse. She presents to our emergency department for the second time this month with suicidal ideations. Patient has been using crack and cocaine heavily.  She has an extensive past psychiatric history with multiple hospitalizations and suicidal attempts. Husband committed suicide a few years ago.   Bipolar disorder: Patient will be continued on Abilify 20 mg a day, Effexor 150 mg a day. In addition lithium was started while patient was in the emergency department. She will be continued on 600 mg of lithium CR at bedtime will need to check lithium level and BMP and 5-6 days time.  Hemoglobin A1c is November was 5.3. Total cholesterol was 219. TSH was 2.7. Will check prolactin level, results still pending. EKG showed a QTC of 455.   Insomnia: Will be targeted with trazodone 100 mg panel daily at bedtime  PTSD: Continue Effexor 150 mg a day and Minipress 2 mg daily at bedtime. Patient also on trazodone 100 mg daily at bedtime  Cocaine, alcohol and marijuana use disorder: Patient is interested in going to intensive outpatient substance abuse at [facility] upon discharge. She was advised to abstain from marijuana and all illicit drugs as they may worsen mood symptoms. She denies any IV drug use.  Benzodiazepine taper: Patient has been on clonazepam for many years. Due to her ongoing substance abuse my plan will be to taper her off benzodiazepines. She was started on Librium 25 mg daily at bedtime which I plan to  discontinue prior to discharge.  Nicotine use disorder we will order nicotine patch at 21 mg a day  Diet regular  Precautions every 15 minute checks  Dispo: will return home once stable  F/u [facility] for psychotropic medication management as well as CST services.  Records from prior hospitalization have been reviewed by Dr Ardyth Harps. Patient was here a year ago.    Daily contact with patient to assess and evaluate symptoms and progress in treatment and Medication management  Levora Angel, MD 12/30/2016, 2:08 PM

## 2016-12-30 NOTE — [facility] Group Notes (Signed)
[facility] Group Notes:  (Nursing/MHT/Case Management/Adjunct)  Date:  12/30/2016  Time:  9:21 PM  Type of Therapy:  Evening Wrap-up Group  Participation Level:  Minimal  Participation Quality:  Attentive  Affect:  Appropriate  Cognitive:  Appropriate  Insight:  Improving  Engagement in Group:  Developing/Improving  Modes of Intervention:  Discussion  Summary of Progress/Problems:  Mercedes Forbes 12/30/2016, 9:21 PM

## 2016-12-31 LAB — HEMOGLOBIN A1C
Hgb A1c MFr Bld: 5 % (ref 4.8–5.6)
MEAN PLASMA GLUCOSE: 97 mg/dL

## 2016-12-31 LAB — PROLACTIN
PROLACTIN: 34.5 ng/mL — AB (ref 4.8–23.3)
PROLACTIN: 56.7 ng/mL — AB (ref 4.8–23.3)

## 2016-12-31 LAB — LITHIUM LEVEL: Lithium Lvl: 0.3 mmol/L — ABNORMAL LOW (ref 0.60–1.20)

## 2016-12-31 MED ORDER — VENLAFAXINE HCL ER 75 MG PO CP24
225.0000 mg | ORAL_CAPSULE | Freq: Every day | ORAL | Status: DC
  Administered 2017-01-01: 225 mg via ORAL
  Filled 2016-12-31: qty 3

## 2016-12-31 NOTE — Progress Notes (Signed)
[facility] MD Progress Note  12/31/2016 1:24 PM Mercedes Forbes  MRN:  [id number]   Subjective:  Mercedes Forbes Kitchen Mercedes Forbes is a 46 year old Caucasian female who presented voluntarily to the emergency room on April 13 with worsening depressive symptoms, suicidal thoughts and a plan to hang herself.  4/15The patient reports that she has been feeling a little better today in terms of her mood. She did go to the wrap up group yesterday evening. She slept much better last night, 7.5 hours but has been reclusive to her room this morning and is not interacting a lot with staff or peers. The patient reports ongoing problems with anxiety related to legal charges. She cannot identify other specific triggers for anxiety or depression. She denies any current active or passive suicidal thoughts but did have passive suicidal thoughts yesterday afternoon. She says the suicidal thoughts, and go. She did talk to her mom and her roommate yesterday which went well. She denies any psychotic symptoms. Appetite is good. She is not attending any groups today and has been isolative to her room. She denies any physical adverse side effects associated with the lithium, Effexor, or Abilify.   4/16 patient reports feeling a little bit better. She complains still of having significant problems with anxiety, she worries about going home without the clonazepam which she has been taking for about 15 years.  Patient has been is sleeping well. Denies side effects from the medications. Denies any physical complaints. Denies having auditory or visual hallucinations. Denies suicidality, homicidality. Per staff she continues to look dysphoric and has been withdrawn to her room.  Per nursing: Pt presents with sad, flat affect. Denies SI, HI, AVH. Endorses depression but states improving. c/o pain to back. Prn given with good relief. Increased anxiety levels. Prn given with good relief.  Encouragement and support offered. Safety checks maintained. Pt receptive  and remains safe on unit with q 15 min checks.   Past Psychiatric History: There were several psychiatric hospitalizations at [facility] and [facility] in 2014 and 2017. She has diagnosis of bipolar disorder, major depression, PTSD, panic disorder. She's been tried on numerous medications but likes her current regimen the most. There were several suicide attempts by overdose  Family Psychiatric History: She denies any history of any mental illness or substance use in the family.  Social History: She is disabled from mental illness.  She has 3 children Ages 57, 53 and 25. She has a good relationship with them. The 48 year old is in college and is doing very well, patient says he is in the Dean's list   Patient lives in a house with a couple and she rents a room to.   Patient has college education  Substance Abuse History: The patient was positive for both marijuana and cocaine. She does not drink alcohol heavily. She relapsed on marijuana and cocaine recently. She denies any opiate or stimulant use. She denies any IV drug use.    Legal History: As far as her legal history she is currently on probation for possession of a stolen firearm. She by a letter to probation in November as her urine toxicology was positive for illicit substances. She was in jail in February for 24 days. Patient tells me her probation officer wants her to do an additional 60 days.   Principal Problem: Bipolar 2 disorder, major depressive episode (HCC) Diagnosis:   Patient Active Problem List   Diagnosis Date Noted  . 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 PTSD (  post-traumatic stress disorder) [F43.10] 04/04/2016  . Tobacco use disorder [F17.200] 04/03/2016  . Bipolar 2 disorder, major depressive episode (HCC) [F31.81] 04/02/2016  . Cocaine use disorder, moderate, dependence (HCC) [F14.20] 04/02/2016  . Cannabis use disorder, moderate, dependence (HCC) [F12.20] 04/02/2016   Total Time spent  with patient: 30 minutes   Past Medical History:  Past Medical History:  Diagnosis Date  . Arthritis   . Bipolar 1 disorder (HCC)   . Headache    migraines  . Personality disorder   . Polysubstance abuse   . Schizophrenia [facility])     Past Surgical History:  Procedure Laterality Date  . KNEE ARTHROSCOPY WITH PATELLA RECONSTRUCTION     left patella removed  . wrists Bilateral    Family History: History reviewed. No pertinent family history.  Social History:  History  Alcohol Use No     History  Drug Use  . Types: Cocaine    Comment: last 1 month ago    Social History   Social History  . Marital status: Widowed    Spouse name: N/A  . Number of children: N/A  . Years of education: N/A   Social History Main Topics  . Smoking status: Current Every Day Smoker    Packs/day: 1.00  . Smokeless tobacco: Never Used     Comment: Nicotine patch was ordered  . Alcohol use No  . Drug use: Yes    Types: Cocaine     Comment: last 1 month ago  . Sexual activity: No   Other Topics Concern  . None   Social History Narrative  . None     Sleep: Good  Appetite:  Good  Current Medications: Current Facility-Administered Medications  Medication Dose Route Frequency Provider Last Rate Last Dose  . acetaminophen (TYLENOL) tablet 650 mg  650 mg Oral Q6H PRN Audery Amel, MD   650 mg at 12/31/16 1019  . alum & mag hydroxide-simeth (MAALOX/MYLANTA) 200-200-20 MG/5ML suspension 30 mL  30 mL Oral Q4H PRN Audery Amel, MD      . ARIPiprazole (ABILIFY) tablet 20 mg  20 mg Oral Daily Audery Amel, MD   20 mg at 12/31/16 0803  . chlordiazePOXIDE (LIBRIUM) capsule 25 mg  25 mg Oral QHS [name], MD   25 mg at 12/30/16 2113  . hydrOXYzine (ATARAX/VISTARIL) tablet 50 mg  50 mg Oral TID PRN Darliss Ridgel, MD   50 mg at 12/31/16 1019  . lithium carbonate (LITHOBID) CR tablet 600 mg  600 mg Oral QHS Darliss Ridgel, MD   600 mg at 12/30/16 2112  . magnesium hydroxide  (MILK OF MAGNESIA) suspension 30 mL  30 mL Oral Daily PRN Audery Amel, MD      . nicotine (NICODERM CQ - dosed in mg/24 hours) patch 21 mg  21 mg Transdermal Daily Audery Amel, MD   21 mg at 12/31/16 0981  . prazosin (MINIPRESS) capsule 2 mg  2 mg Oral QHS Audery Amel, MD   2 mg at 12/30/16 2113  . traZODone (DESYREL) tablet 100 mg  100 mg Oral QHS [name], MD   100 mg at 12/30/16 2113  . [START ON 01/01/2017] venlafaxine XR (EFFEXOR-XR) 24 hr capsule 225 mg  225 mg Oral Q breakfast [name], MD        Lab Results:  No results found for this or any previous visit (from the past 48 hour(s)).  Blood Alcohol level:  Lab Results  Component Value Date   ETH <5 12/28/2016   ETH <5 08/03/2016    Metabolic Disorder Labs: Lab Results  Component Value Date   HGBA1C 5.0 12/29/2016   MPG 97 12/29/2016   MPG 105 07/23/2016   Lab Results  Component Value Date   PROLACTIN 16.0 07/23/2016   PROLACTIN 12.0 04/03/2016   Lab Results  Component Value Date   CHOL 219 (H) 12/29/2016   TRIG 198 (H) 12/29/2016   HDL 55 12/29/2016   CHOLHDL 4.0 12/29/2016   VLDL 40 12/29/2016   LDLCALC 124 (H) 12/29/2016   LDLCALC 126 (H) 07/23/2016    Musculoskeletal: Strength & Muscle Tone: within normal limits Gait & Station: normal Patient leans: N/A  Psychiatric Specialty Exam: Physical Exam  Constitutional: She is oriented to person, place, and time. She appears well-developed and well-nourished.  HENT:  Head: Normocephalic and atraumatic.  Eyes: EOM are normal.  Neck: Normal range of motion.  Respiratory: Effort normal.  Musculoskeletal: Normal range of motion.  Neurological: She is alert and oriented to person, place, and time.  Skin:  She does have a cut on her left wrist with stitches. No bleeding or signs of infection.    Review of Systems  Constitutional: Negative.  Negative for chills, fever and weight loss.  HENT: Negative.  Negative for  congestion, ear pain, hearing loss, sore throat and tinnitus.   Eyes: Negative.  Negative for blurred vision, double vision and photophobia.  Respiratory: Negative.  Negative for cough, sputum production and wheezing.   Cardiovascular: Negative.  Negative for chest pain and palpitations.  Gastrointestinal: Negative.  Negative for heartburn, nausea and vomiting.  Genitourinary: Negative.  Negative for dysuria, frequency, hematuria and urgency.  Musculoskeletal: Negative.  Negative for back pain, myalgias and neck pain.       She does have stitches on her left wrist from cutting herself. No bleeding or signs of infection.  Skin: Negative.  Negative for itching and rash.  Neurological: Negative.  Negative for dizziness, tingling, tremors, sensory change, seizures, loss of consciousness and headaches.  Endo/Heme/Allergies: Negative.  Does not bruise/bleed easily.  Psychiatric/Behavioral: Positive for depression and substance abuse. Negative for hallucinations, memory loss and suicidal ideas. The patient is nervous/anxious. The patient does not have insomnia.     Blood pressure 96/76, pulse 80, temperature 97.8 F (36.6 C), resp. rate 18, height  (1.727 m), weight 80.7 kg (178 lb), SpO2 99 %.Body mass index is 27.06 kg/m.  General Appearance: Disheveled  Eye Contact:  Good  Speech:  Clear and Coherent and Normal Rate  Volume:  Normal  Mood:  Depressed  Affect:  Depressed  Thought Process:  Coherent, Goal Directed and Linear  Orientation:  Full (Time, Place, and Person)  Thought Content:  Logical  Suicidal Thoughts:  No  Homicidal Thoughts:  No  Memory:  Immediate;   Good Recent;   Good Remote;   Good  Judgement:  Impaired  Insight:  Fair  Psychomotor Activity:  Normal  Concentration:  Concentration: Good and Attention Span: Good  Recall:  Good  Fund of Knowledge:  Good  Language:  Good  Akathisia:  No  Handed:  Right  AIMS (if indicated):     Assets:  Communication  Skills Housing Physical Health  ADL's:  Intact  Cognition:  WNL  Sleep:  Number of Hours: 7     Treatment Plan Summary:  Patient is a 46 year old female with a history of bipolar disorder, PTSD and substance abuse. She presents  to our emergency department for the second time this month with suicidal ideations. Patient has been using crack and cocaine heavily.  She has an extensive past psychiatric history with multiple hospitalizations and suicidal attempts. Husband committed suicide a few years ago.   Bipolar disorder: Patient will be continued on Abilify 20 mg a day, Effexor will be increased to 225 mg. In addition lithium was started while patient was in the emergency department. She will be continued on 600 mg of lithium CR at bedtime.   Hemoglobin A1c is November was 5.3. Total cholesterol was 219. TSH was 2.7. EKG showed a QTC of 455.   Insomnia: Continue trazodone 100 mg by mouth daily at bedtime  Anxiety: We will increase Effexor to 225 5 mg by mouth daily to target depression and anxiety. Patient complains of still has significant issues with nervousness and PTSD symptoms.  PTSD: Continue Effexor  and Minipress 2 mg daily at bedtime. Patient also on trazodone 100 mg daily at bedtime  Cocaine, alcohol and marijuana use disorder: Patient is interested in going to intensive outpatient substance abuse at [facility] upon discharge. She was advised to abstain from marijuana and all illicit drugs as they may worsen mood symptoms. She denies any IV drug use.  Benzodiazepine taper: Patient has been on clonazepam for many years. Due to her ongoing substance abuse my plan will be to taper her off benzodiazepines. She was started on Librium 25 mg daily at bedtime. As the patient has been on clonazepam 1 mg twice a day for more than 15 years I plan to discharge him on Librium 25 mg at bedtime. I will then recommend to her outpatient provider to consider discontinuation of  benzodiazepines.  Nicotine use disorder: continue nicotine patch at 21 mg a day  Diet regular  Precautions every 15 minute checks  Dispo: will return home once stable  F/u [facility] for psychotropic medication management as well as CST services.  Possible discharge in the next 24-48 hours.  We will remote sutures on her wrist from self-inflicted cut today  Daily contact with patient to assess and evaluate symptoms and progress in treatment and Medication management  [name], MD 12/31/2016, 1:24 PM

## 2016-12-31 NOTE — Progress Notes (Signed)
Recreation Therapy Notes  Date: 04.16.18 Time: 1:15 pm Location: Craft Room  Group Topic: Wellness  Goal Area(s) Addresses:  Patient will identify at least one item per dimension of health. Patient will examine areas they are deficient in.  Behavioral Response: Attentive, Interactive  Intervention: 6 Dimensions of Health  Activity: Patients were given a definition sheet with each dimension of health define and a worksheet listing each dimension of health. Patients were instructed to write at least one item they were currently doing in each dimension.  Education: LRT educated patients on ways to improve each dimension.  Education Outcome: Acknowledges education/In group clarification offered  Clinical Observations/Feedback: Patient wrote items in each dimension. Patient contributed to group discussion by stating areas she was not giving enough attention to, areas she was giving enough attention to, and how she can improve certain areas.  Jacquelynn Cree 12/31/2016 2:04 PM

## 2016-12-31 NOTE — Progress Notes (Signed)
D: Pt denies SI/HI/AVH. Pt is pleasant and cooperative, affect is flat and sad but brightens upon approach. Pt appears less anxious and she is interacting with peers and staff appropriately.  A: Pt was offered support and encouragement. Pt was given scheduled medications. Pt was encouraged to attend groups. Q 15 minute checks were done for safety.  R:Pt attends groups and interacts well with peers and staff. Pt is complaint with medication. Pt has no complaints.Pt receptive to treatment and safety maintained on unit.

## 2016-12-31 NOTE — [facility] Group Notes (Signed)
[facility] LCSW Group Therapy   12/31/2016 9:30 am Type of Therapy: Group Therapy   Participation Level: Active   Participation Quality: Attentive, Sharing and Supportive   Affect:Flat   Cognitive: Alert and Oriented   Insight: Developing/Improving and Engaged   Engagement in Therapy: Developing/Improving and Engaged   Modes of Intervention: Clarification, Confrontation, Discussion, Education, Exploration,  Limit-setting, Orientation, Problem-solving, Rapport Building, Dance movement psychotherapist, Socialization and Support   Summary of Progress/Problems: Pt identified obstacles faced currently and processed barriers involved in overcoming these obstacles. Pt identified steps necessary for overcoming these obstacles and explored motivation (internal and external) for facing these difficulties head on. Pt further identified one area of concern in their lives and chose a goal to focus on for today.   Hampton Abbot, MSW, LCSWA 12/31/2016, 10:21AM

## 2016-12-31 NOTE — Progress Notes (Signed)
Recreation Therapy Notes  INPATIENT RECREATION THERAPY ASSESSMENT  Patient Details Name: Mercedes Forbes MRN: [id number] DOB: 05-29-1971 Today's Date: 12/31/2016  Patient Stressors: Family, Friends, Other (Comment) (Sisiters do not let her come to her mom's house so she can't see her mom often; lack of supportive friends; finances, court, probation)  Coping Skills:   Isolate, Substance Abuse, Avoidance, Self-Injury, Exercise, Art/Dance, Talking, Music, Sports, Other (Comment) (Playing with dog, write)  Personal Challenges: Concentration, Decision-Making, Expressing Yourself, Self-Esteem/Confidence, Social Interaction, Stress Management, Substance Abuse, Time Management, Trusting Others  Leisure Interests (2+):  Art - Draw, Individual - Writing, Individual - Other (Comment) (Play with dog)  Awareness of Community Resources:  No  Community Resources:     Current Use:    If no, Barriers?:    Patient Strengths:  Good person, helpful  Patient Identified Areas of Improvement:  Everything  Current Recreation Participation:  Spending time with dog and kids  Patient Goal for Hospitalization:  To not end up here again for the same reason  [place] of Residence:  [place] of Residence:  [place]   Current SI (including self-harm):  No  Current HI:  No  Consent to Intern Participation: N/A   Jacquelynn Cree, LRT/CTRS 12/31/2016, 3:45 PM

## 2016-12-31 NOTE — Tx Team (Addendum)
Interdisciplinary Treatment and Diagnostic Plan Update  12/31/2016 Time of Session: 1130 [address] MRN: [id number]  Principal Diagnosis: Bipolar 2 disorder, major depressive episode (HCC)  Secondary Diagnoses: Principal Problem:   Bipolar 2 disorder, major depressive episode (HCC) Active Problems:   Cocaine use disorder, moderate, dependence (HCC)   Cannabis use disorder, moderate, dependence (HCC)   Tobacco use disorder   PTSD (post-traumatic stress disorder)   Opiate abuse, episodic   Current Medications:  Current Facility-Administered Medications  Medication Dose Route Frequency Provider Last Rate Last Dose  . acetaminophen (TYLENOL) tablet 650 mg  650 mg Oral Q6H PRN Audery Amel, MD   650 mg at 12/31/16 1019  . alum & mag hydroxide-simeth (MAALOX/MYLANTA) 200-200-20 MG/5ML suspension 30 mL  30 mL Oral Q4H PRN Audery Amel, MD      . ARIPiprazole (ABILIFY) tablet 20 mg  20 mg Oral Daily Audery Amel, MD   20 mg at 12/31/16 0803  . chlordiazePOXIDE (LIBRIUM) capsule 25 mg  25 mg Oral QHS [name], MD   25 mg at 12/30/16 2113  . hydrOXYzine (ATARAX/VISTARIL) tablet 50 mg  50 mg Oral TID PRN Darliss Ridgel, MD   50 mg at 12/31/16 1019  . lithium carbonate (LITHOBID) CR tablet 600 mg  600 mg Oral QHS Darliss Ridgel, MD   600 mg at 12/30/16 2112  . magnesium hydroxide (MILK OF MAGNESIA) suspension 30 mL  30 mL Oral Daily PRN Audery Amel, MD      . nicotine (NICODERM CQ - dosed in mg/24 hours) patch 21 mg  21 mg Transdermal Daily Audery Amel, MD   21 mg at 12/31/16 4098  . prazosin (MINIPRESS) capsule 2 mg  2 mg Oral QHS Audery Amel, MD   2 mg at 12/30/16 2113  . traZODone (DESYREL) tablet 100 mg  100 mg Oral QHS [name], MD   100 mg at 12/30/16 2113  . [START ON 01/01/2017] venlafaxine XR (EFFEXOR-XR) 24 hr capsule 225 mg  225 mg Oral Q breakfast [name], MD       PTA Medications: Prescriptions Prior to Admission   Medication Sig Dispense Refill Last Dose  . acetaminophen-codeine (TYLENOL #3) 300-30 MG tablet Take 1 tablet by mouth 3 (three) times daily as needed for pain.   08/24/2016  . ARIPiprazole (ABILIFY) 20 MG tablet Take 1 tablet (20 mg total) by mouth daily. 30 tablet 0 08/26/2016 at Unknown time  . clonazePAM (KLONOPIN) 1 MG tablet Take 1 mg by mouth 2 (two) times daily.   08/26/2016 at Unknown time  . oxyCODONE-acetaminophen (ROXICET) 5-325 MG tablet Take 1 tablet by mouth every 6 (six) hours as needed for moderate pain. 12 tablet 0   . potassium chloride (K-DUR,KLOR-CON) 10 MEQ tablet Take 30 mEq by mouth 2 (two) times daily.   08/26/2016 at Unknown time  . prazosin (MINIPRESS) 2 MG capsule Take 1 capsule (2 mg total) by mouth at bedtime. (Patient taking differently: Take 2 mg by mouth 2 (two) times daily. ) 30 capsule 0 08/26/2016 at Unknown time  . venlafaxine XR (EFFEXOR-XR) 150 MG 24 hr capsule Take 1 capsule (150 mg total) by mouth daily with breakfast. 30 capsule 0 08/26/2016 at Unknown time  . zolpidem (AMBIEN) 10 MG tablet Take 10 mg by mouth at bedtime as needed for sleep.   08/26/2016 at Unknown time    Patient Stressors: Financial difficulties Legal issue Substance abuse Traumatic event  Patient Strengths: Average  or above average intelligence Communication skills Supportive family/friends  Treatment Modalities: Medication Management, Group therapy, Case management,  1 to 1 session with clinician, Psychoeducation, Recreational therapy.   Physician Treatment Plan for Primary Diagnosis: Bipolar 2 disorder, major depressive episode (HCC) Long Term Goal(s): Improvement in symptoms so as ready for discharge Improvement in symptoms so as ready for discharge   Short Term Goals: Ability to identify changes in lifestyle to reduce recurrence of condition will improve Ability to demonstrate self-control will improve Ability to identify and develop effective coping behaviors will  improve Ability to identify triggers associated with substance abuse/mental health issues will improve Ability to verbalize feelings will improve  Medication Management: Evaluate patient's response, side effects, and tolerance of medication regimen.  Therapeutic Interventions: 1 to 1 sessions, Unit Group sessions and Medication administration.  Evaluation of Outcomes: Progressing  Physician Treatment Plan for Secondary Diagnosis: Principal Problem:   Bipolar 2 disorder, major depressive episode (HCC) Active Problems:   Cocaine use disorder, moderate, dependence (HCC)   Cannabis use disorder, moderate, dependence (HCC)   Tobacco use disorder   PTSD (post-traumatic stress disorder)   Opiate abuse, episodic  Long Term Goal(s): Improvement in symptoms so as ready for discharge Improvement in symptoms so as ready for discharge   Short Term Goals: Ability to identify changes in lifestyle to reduce recurrence of condition will improve Ability to demonstrate self-control will improve Ability to identify and develop effective coping behaviors will improve Ability to identify triggers associated with substance abuse/mental health issues will improve Ability to verbalize feelings will improve     Medication Management: Evaluate patient's response, side effects, and tolerance of medication regimen.  Therapeutic Interventions: 1 to 1 sessions, Unit Group sessions and Medication administration.  Evaluation of Outcomes: Progressing   RN Treatment Plan for Primary Diagnosis: Bipolar 2 disorder, major depressive episode (HCC) Long Term Goal(s): Knowledge of disease and therapeutic regimen to maintain health will improve  Short Term Goals: Ability to verbalize feelings will improve, Ability to identify and develop effective coping behaviors will improve and Compliance with prescribed medications will improve  Medication Management: RN will administer medications as ordered by provider, will  assess and evaluate patient's response and provide education to patient for prescribed medication. RN will report any adverse and/or side effects to prescribing provider.  Therapeutic Interventions: 1 on 1 counseling sessions, Psychoeducation, Medication administration, Evaluate responses to treatment, Monitor vital signs and CBGs as ordered, Perform/monitor CIWA, COWS, AIMS and Fall Risk screenings as ordered, Perform wound care treatments as ordered.  Evaluation of Outcomes: Progressing   LCSW Treatment Plan for Primary Diagnosis: Bipolar 2 disorder, major depressive episode (HCC) Long Term Goal(s): Safe transition to appropriate next level of care at discharge, Engage patient in therapeutic group addressing interpersonal concerns.  Short Term Goals: Engage patient in aftercare planning with referrals and resources and Increase skills for wellness and recovery  Therapeutic Interventions: Assess for all discharge needs, 1 to 1 time with Social worker, Explore available resources and support systems, Assess for adequacy in community support network, Educate family and significant other(s) on suicide prevention, Complete Psychosocial Assessment, Interpersonal group therapy.  Evaluation of Outcomes: Progressing    Recreational Therapy Treatment Plan for Primary Diagnosis: Bipolar 2 disorder, major depressive episode (HCC) Long Term Goal(s): Patient will participate in recreation therapy treatment in at least 2 group sessions without prompting from LRT  Short Term Goals: Increase healthy decision making skills, Increase stress management skills  Treatment Modalities: Group Therapy and Individual Treatment  Sessions  Therapeutic Interventions: Psychoeducation  Evaluation of Outcomes: Progressing   Progress in Treatment: Attending groups: Yes. Participating in groups: Yes. Taking medication as prescribed: Yes. Toleration medication: Yes. Family/Significant other contact made: No, will  contact:  pt refused Patient understands diagnosis: Yes. Discussing patient identified problems/goals with staff: Yes. Medical problems stabilized or resolved: Yes. Denies suicidal/homicidal ideation: Yes. Issues/concerns per patient self-inventory: No. Other: none  New problem(s) identified: No, Describe:  none  New Short Term/Long Term Goal(s):  Discharge Plan or Barriers: CSW assessing for appropriate plan.  Reason for Continuation of Hospitalization: Depression Medication stabilization  Estimated Length of Stay: 1-2 days  Attendees: Patient: 12/31/2016   Physician: Dr. Ardyth Harps, MD 12/31/2016   Nursing: Hulan Amato, RN 12/31/2016   RN Care Manager: 12/31/2016   Social Worker: Daleen Squibb, LCSW 12/31/2016   Recreational Therapist: Princella Ion, LRT/CTRS  12/31/2016   Other:  12/31/2016   Other:  12/31/2016   Other: 12/31/2016     Scribe for Treatment Team: Lorri Frederick, LCSW 12/31/2016 1:58 PM

## 2016-12-31 NOTE — Progress Notes (Signed)
D: Patient stated slept good last night .Stated appetite is good and energy level  Is normal. Stated concentration is good . Stated on Depression scale 7 , hopeless  5 and anxiety 7.( low 0-10 high) Denies suicidal  homicidal ideations  .  No auditory hallucinations  No pain concerns . Appropriate ADL'S. Interacting with peers and staff. Continue to work on stressors  With coping skills  A: Encourage patient participation with unit programming . Instruction  Given on  Medication , verbalize understanding. R: Voice no other concerns. Staff continue to monitor

## 2016-12-31 NOTE — Plan of Care (Signed)
Problem: Coping: Goal: Ability to verbalize feelings will improve Outcome: Progressing Working on Materials engineer

## 2017-01-01 MED ORDER — TRAZODONE HCL 100 MG PO TABS: 100.0000 mg | ORAL_TABLET | Freq: Every day | ORAL | 0 refills | Status: DC

## 2017-01-01 MED ORDER — CHLORDIAZEPOXIDE HCL 25 MG PO CAPS: 25.0000 mg | ORAL_CAPSULE | Freq: Every day | ORAL | 0 refills | Status: DC

## 2017-01-01 MED ORDER — VENLAFAXINE HCL ER 75 MG PO CP24: 225.0000 mg | ORAL_CAPSULE | Freq: Every day | ORAL | 0 refills | Status: DC

## 2017-01-01 MED ORDER — LITHIUM CARBONATE ER 300 MG PO TBCR: 600.0000 mg | EXTENDED_RELEASE_TABLET | Freq: Every day | ORAL | 0 refills | Status: DC
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
## 2017-01-01 NOTE — Progress Notes (Signed)
Patient ID: Mercedes Forbes, female   DOB: 1971-05-21, 46 y.o.   MRN: [id number] AIMS Score = 17, EPS noted, chart & MAR reviewed, patient will benefit from Cogentin or Benadryl; the involuntary movements and smacking of mouth is not a new onset; A&Ox3, pleasant on approach, c/o 7/10 chronic back aching pain, Tylenol 650 mg provided partial relief, hydroxyzine 50 mg given for c/o anxiety; visible, interacting well with peers, denied SI/HI, denied AV/H.
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
## 2017-01-01 NOTE — Progress Notes (Signed)
D: Patient is aware of  Discharge this shift .Patient denies suicidal /homicidal ideations. Patient received all belongings brought in  A: Writer reviewed Discharge Summary, Suicide Risk Assessment, and Transitional Record. Patient also received Prescriptions   from  MD.  prescriptions  given to patient f . Aware  Of follow up appointment . R: Patient left unit with no questions  Or concerns  With mother

## 2017-01-01 NOTE — [facility] Group Notes (Signed)
[facility] LCSW Group Therapy Note  Date/Time: 01/01/17, 0930  Type of Therapy/Topic:  Group Therapy:  Feelings about Diagnosis  Participation Level:  Did Not Attend   Mood:   Description of Group:    This group will allow patients to explore their thoughts and feelings about diagnoses they have received. Patients will be guided to explore their level of understanding and acceptance of these diagnoses. Facilitator will encourage patients to process their thoughts and feelings about the reactions of others to their diagnosis, and will guide patients in identifying ways to discuss their diagnosis with significant others in their lives. This group will be process-oriented, with patients participating in exploration of their own experiences as well as giving and receiving support and challenge from other group members.   Therapeutic Goals: 1. Patient will demonstrate understanding of diagnosis as evidence by identifying two or more symptoms of the disorder:  2. Patient will be able to express two feelings regarding the diagnosis 3. Patient will demonstrate ability to communicate their needs through discussion and/or role plays  Summary of Patient Progress:        Therapeutic Modalities:   Cognitive Behavioral Therapy Brief Therapy Feelings Identification   Greg Jarae Nemmers, LCSW 

## 2017-01-01 NOTE — Plan of Care (Signed)
Problem: Activity: Goal: Sleeping patterns will improve Outcome: Progressing Patient slept for Estimated Hours of 7.45; every 15 minutes safety round maintained, no injury or falls during this shift.    

## 2017-01-01 NOTE — Plan of Care (Signed)
Problem: [facility] Participation in Recreation Therapeutic Interventions Goal: STG-Other Recreation Therapy Goal (Specify) STG: Decision Making - Within 3 treatment sessions, patient will verbalize understanding of the decision making charts in one treatment session to increase healthy decision making skills.  Outcome: Completed/Met Date Met: 01/01/17 Treatment Session 1; Completed 1 out of 1: At approximately 10:40 am, LRT met with patient in consultation room. LRT educated and provided patient with charts on decision making. Patient verbalized understanding. LRT encouraged patient to use the charts to help her make better decisions.  Leonette Monarch, LRT/CTRS 04.17.18 11:45 am  Problem: [facility] Participation in Recreation Therapeutic Interventions Goal: STG-Other Recreation Therapy Goal (Specify) STG: Stress Management - Within 3 treatment sessions, patient will verbalize understanding of the stress management techniques in one treatment session to increase stress management skills.  Outcome: Completed/Met Date Met: 01/01/17 Treatment Session 1; Completed 1 out of 1: At approximately 10:40 am, LRT met with patient in consultation room. Patient verbalized understanding of the stress management techniques. Patient verbalize understanding. LRT encouraged patient to read over and practice the stress management techniques.  Leonette Monarch, LRT/CTRS 04.17.18 11:46 am

## 2017-01-01 NOTE — Progress Notes (Signed)
Recreation Therapy Notes  INPATIENT RECREATION TR PLAN  Patient Details Name: AKEIRA LAHM MRN: [id number] DOB: 10-11-1970 Today's Date: 01/01/2017  Rec Therapy Plan Is patient appropriate for Therapeutic Recreation?: Yes Treatment times per week: At least once a week TR Treatment/Interventions: 1:1 session, Group participation (Comment) (Appropriate participation in daily recreational therapy tx)  Discharge Criteria Pt will be discharged from therapy if:: Treatment goals are met, Discharged Treatment plan/goals/alternatives discussed and agreed upon by:: Patient/family  Discharge Summary Short term goals set: See Care Plan Short term goals met: Complete Progress toward goals comments: One-to-one attended Which groups?: Wellness One-to-one attended: Stress management, decision making Reason goals not met: N/A Therapeutic equipment acquired: None Reason patient discharged from therapy: Discharge from hospital Pt/family agrees with progress & goals achieved: Yes Date patient discharged from therapy: 01/01/17   Leonette Monarch, LRT/CTRS 01/01/2017, 4:19 PM

## 2017-01-01 NOTE — [facility] Group Notes (Signed)
[facility] Group Notes:  (Nursing/MHT/Case Management/Adjunct)  Date:  01/01/2017  Time:  4:17 AM  Type of Therapy:  Group Therapy  Participation Level:  Active  Participation Quality:  Appropriate  Affect:  Appropriate  Cognitive:  Appropriate  Insight:  Appropriate  Engagement in Group:  Engaged  Modes of Intervention:  n/a  Summary of Progress/Problems:  Mercedes Forbes 01/01/2017, 4:17 AM

## 2017-01-01 NOTE — Progress Notes (Signed)
  [facility] Adult Case Management Discharge Plan :  Will you be returning to the same living situation after discharge:  Yes,  with roomate At discharge, do you have transportation home?: Yes,  roomate's mother Do you have the ability to pay for your medications: Yes,  medicare/medicaid  Release of information consent forms completed and in the chart;  Patient's signature needed at discharge.  Patient to Follow up at: Follow-up Information    [facility]. Go on 01/03/2017.   Why:  Please attend your hospital discharge appointment at [facility] on 01/03/17 at 2:30pm.  Please bring a copy of your hospital discharge paperwork. Contact information: [address] [place] [address] [phone]           Next level of care provider has access to [facility] Link:no  Safety Planning and Suicide Prevention discussed: No. Pt refused.  Have you used any form of tobacco in the last 30 days? (Cigarettes, Smokeless Tobacco, Cigars, and/or Pipes): Yes  Has patient been referred to the Quitline?: Patient refused referral  Patient has been referred for addiction treatment: Yes  Lorri Frederick, LCSW 01/01/2017, 11:27 AM

## 2017-03-05 ENCOUNTER — Inpatient Hospital Stay
Admission: EM | Admit: 2017-03-05 | Discharge: 2017-03-08 | DRG: 885 | Disposition: A | Discharge status: Home / Self Care | Payer: Medicare Other | Source: Intra-hospital | Attending: Psychiatry | Admitting: Psychiatry

## 2017-03-05 ENCOUNTER — Emergency Department
Admission: EM | Admit: 2017-03-05 | Discharge: 2017-03-05 | Disposition: A | Discharge status: Other Acute Inpatient Hospital | Payer: Medicare Other | Attending: Emergency Medicine | Admitting: Emergency Medicine

## 2017-03-05 DIAGNOSIS — R441 Visual hallucinations: Secondary | ICD-10-CM | POA: Diagnosis not present

## 2017-03-05 DIAGNOSIS — E039 Hypothyroidism, unspecified: Secondary | ICD-10-CM | POA: Diagnosis present

## 2017-03-05 DIAGNOSIS — F1220 Cannabis dependence, uncomplicated: Secondary | ICD-10-CM | POA: Diagnosis present

## 2017-03-05 DIAGNOSIS — Z79899 Other long term (current) drug therapy: Secondary | ICD-10-CM | POA: Diagnosis not present

## 2017-03-05 DIAGNOSIS — F1910 Other psychoactive substance abuse, uncomplicated: Secondary | ICD-10-CM

## 2017-03-05 DIAGNOSIS — Z79890 Hormone replacement therapy (postmenopausal): Secondary | ICD-10-CM

## 2017-03-05 DIAGNOSIS — R45851 Suicidal ideations: Secondary | ICD-10-CM | POA: Diagnosis present

## 2017-03-05 DIAGNOSIS — F17210 Nicotine dependence, cigarettes, uncomplicated: Secondary | ICD-10-CM | POA: Diagnosis present

## 2017-03-05 DIAGNOSIS — R440 Auditory hallucinations: Secondary | ICD-10-CM | POA: Insufficient documentation

## 2017-03-05 DIAGNOSIS — F4310 Post-traumatic stress disorder, unspecified: Secondary | ICD-10-CM | POA: Diagnosis present

## 2017-03-05 DIAGNOSIS — F1420 Cocaine dependence, uncomplicated: Secondary | ICD-10-CM | POA: Diagnosis present

## 2017-03-05 DIAGNOSIS — F1920 Other psychoactive substance dependence, uncomplicated: Secondary | ICD-10-CM | POA: Insufficient documentation

## 2017-03-05 DIAGNOSIS — F323 Major depressive disorder, single episode, severe with psychotic features: Secondary | ICD-10-CM | POA: Diagnosis present

## 2017-03-05 DIAGNOSIS — F3181 Bipolar II disorder: Secondary | ICD-10-CM

## 2017-03-05 DIAGNOSIS — N179 Acute kidney failure, unspecified: Secondary | ICD-10-CM | POA: Insufficient documentation

## 2017-03-05 DIAGNOSIS — F1110 Opioid abuse, uncomplicated: Secondary | ICD-10-CM | POA: Diagnosis present

## 2017-03-05 DIAGNOSIS — N289 Disorder of kidney and ureter, unspecified: Secondary | ICD-10-CM

## 2017-03-05 DIAGNOSIS — R443 Hallucinations, unspecified: Secondary | ICD-10-CM | POA: Diagnosis present

## 2017-03-05 DIAGNOSIS — F17200 Nicotine dependence, unspecified, uncomplicated: Secondary | ICD-10-CM | POA: Diagnosis present

## 2017-03-05 LAB — URINALYSIS, COMPLETE (UACMP) WITH MICROSCOPIC
BILIRUBIN URINE: NEGATIVE
Glucose, UA: NEGATIVE mg/dL
KETONES UR: NEGATIVE mg/dL
LEUKOCYTES UA: NEGATIVE
NITRITE: NEGATIVE
PROTEIN: NEGATIVE mg/dL
SPECIFIC GRAVITY, URINE: 1.004 — AB (ref 1.005–1.030)
pH: 6 (ref 5.0–8.0)

## 2017-03-05 LAB — COMPREHENSIVE METABOLIC PANEL
ALBUMIN: 4.4 g/dL (ref 3.5–5.0)
ALK PHOS: 75 U/L (ref 38–126)
ALT: 12 U/L — ABNORMAL LOW (ref 14–54)
ANION GAP: 11 (ref 5–15)
AST: 30 U/L (ref 15–41)
BILIRUBIN TOTAL: 0.5 mg/dL (ref 0.3–1.2)
BUN: 7 mg/dL (ref 6–20)
CO2: 21 mmol/L — AB (ref 22–32)
Calcium: 9.1 mg/dL (ref 8.9–10.3)
Chloride: 105 mmol/L (ref 101–111)
Creatinine, Ser: 1.14 mg/dL — ABNORMAL HIGH (ref 0.44–1.00)
GFR calc Af Amer: >60 mL/min (ref >60)
GFR calc non Af Amer: 56 mL/min — ABNORMAL LOW (ref >60)
GLUCOSE: 101 mg/dL — AB (ref 65–99)
POTASSIUM: 3.5 mmol/L (ref 3.5–5.1)
SODIUM: 137 mmol/L (ref 135–145)
TOTAL PROTEIN: 7.8 g/dL (ref 6.5–8.1)

## 2017-03-05 LAB — URINE DRUG SCREEN, QUALITATIVE (ARMC ONLY)
AMPHETAMINES, UR SCREEN: NOT DETECTED
Barbiturates, Ur Screen: NOT DETECTED
Benzodiazepine, Ur Scrn: POSITIVE — AB
COCAINE METABOLITE, UR ~~LOC~~: POSITIVE — AB
Cannabinoid 50 Ng, Ur ~~LOC~~: POSITIVE — AB
MDMA (ECSTASY) UR SCREEN: NOT DETECTED
METHADONE SCREEN, URINE: NOT DETECTED
OPIATE, UR SCREEN: NOT DETECTED
PHENCYCLIDINE (PCP) UR S: NOT DETECTED
Tricyclic, Ur Screen: NOT DETECTED

## 2017-03-05 LAB — CBC
HEMATOCRIT: 40.4 % (ref 35.0–47.0)
HEMOGLOBIN: 13.7 g/dL (ref 12.0–16.0)
MCH: 31.1 pg (ref 26.0–34.0)
MCHC: 33.9 g/dL (ref 32.0–36.0)
MCV: 91.9 fL (ref 80.0–100.0)
Platelets: 216 10*3/uL (ref 150–440)
RBC: 4.39 MIL/uL (ref 3.80–5.20)
RDW: 13.9 % (ref 11.5–14.5)
WBC: 11.4 10*3/uL — AB (ref 3.6–11.0)

## 2017-03-05 LAB — ETHANOL: Alcohol, Ethyl (B): <5 mg/dL (ref <5)

## 2017-03-05 LAB — SALICYLATE LEVEL: Salicylate Lvl: <7 mg/dL (ref 2.8–30.0)

## 2017-03-05 LAB — POCT PREGNANCY, URINE: Preg Test, Ur: NEGATIVE

## 2017-03-05 LAB — ACETAMINOPHEN LEVEL

## 2017-03-05 MED ORDER — ARIPIPRAZOLE 10 MG PO TABS
ORAL_TABLET | ORAL | Status: AC
  Filled 2017-03-05: qty 3

## 2017-03-05 MED ORDER — ARIPIPRAZOLE 10 MG PO TABS
30.0000 mg | ORAL_TABLET | Freq: Every day | ORAL | Status: DC
  Administered 2017-03-06 – 2017-03-08 (×3): 30 mg via ORAL
  Filled 2017-03-05 (×3): qty 3

## 2017-03-05 MED ORDER — TRAZODONE HCL 100 MG PO TABS: 100.0000 mg | ORAL_TABLET | Freq: Every day | ORAL | Status: DC

## 2017-03-05 MED ORDER — LITHIUM CARBONATE ER 300 MG PO TBCR
600.0000 mg | EXTENDED_RELEASE_TABLET | Freq: Every day | ORAL | Status: DC
  Administered 2017-03-05 – 2017-03-06 (×2): 600 mg via ORAL
  Filled 2017-03-05 (×2): qty 2

## 2017-03-05 MED ORDER — TRAZODONE HCL 100 MG PO TABS
100.0000 mg | ORAL_TABLET | Freq: Every day | ORAL | Status: DC
  Administered 2017-03-05 – 2017-03-07 (×3): 100 mg via ORAL
  Filled 2017-03-05 (×3): qty 1

## 2017-03-05 MED ORDER — CHLORDIAZEPOXIDE HCL 25 MG PO CAPS: 25.0000 mg | ORAL_CAPSULE | Freq: Every day | ORAL | Status: DC

## 2017-03-05 MED ORDER — VENLAFAXINE HCL ER 75 MG PO CP24
225.0000 mg | ORAL_CAPSULE | Freq: Every day | ORAL | Status: DC
  Administered 2017-03-06 – 2017-03-08 (×3): 225 mg via ORAL
  Filled 2017-03-05 (×3): qty 3

## 2017-03-05 MED ORDER — CHLORDIAZEPOXIDE HCL 25 MG PO CAPS
25.0000 mg | ORAL_CAPSULE | Freq: Every day | ORAL | Status: DC
  Administered 2017-03-05: 25 mg via ORAL
  Filled 2017-03-05: qty 1

## 2017-03-05 MED ORDER — ACETAMINOPHEN 325 MG PO TABS: 650.0000 mg | ORAL_TABLET | Freq: Four times a day (QID) | ORAL | Status: DC | PRN

## 2017-03-05 MED ORDER — PRAZOSIN HCL 2 MG PO CAPS: 2.0000 mg | ORAL_CAPSULE | Freq: Every day | ORAL | Status: DC

## 2017-03-05 MED ORDER — MAGNESIUM HYDROXIDE 400 MG/5ML PO SUSP: 30.0000 mL | Freq: Every day | ORAL | Status: DC | PRN

## 2017-03-05 MED ORDER — ALUM & MAG HYDROXIDE-SIMETH 200-200-20 MG/5ML PO SUSP: 30.0000 mL | ORAL | Status: DC | PRN

## 2017-03-05 MED ORDER — VENLAFAXINE HCL ER 75 MG PO CP24: 225.0000 mg | ORAL_CAPSULE | Freq: Every day | ORAL | Status: DC

## 2017-03-05 MED ORDER — LITHIUM CARBONATE ER 300 MG PO TBCR: 600.0000 mg | EXTENDED_RELEASE_TABLET | Freq: Every day | ORAL | Status: DC

## 2017-03-05 MED ORDER — ARIPIPRAZOLE 10 MG PO TABS
30.0000 mg | ORAL_TABLET | Freq: Every day | ORAL | Status: DC
  Administered 2017-03-05: 30 mg via ORAL

## 2017-03-05 MED ORDER — PRAZOSIN HCL 2 MG PO CAPS
2.0000 mg | ORAL_CAPSULE | Freq: Every day | ORAL | Status: DC
  Administered 2017-03-06 – 2017-03-07 (×2): 2 mg via ORAL
  Filled 2017-03-05 (×2): qty 1

## 2017-03-05 NOTE — ED Notes (Signed)
Pt given blanket, meal tray and juice.

## 2017-03-05 NOTE — BH Assessment (Signed)
EDP Dr.Norman informed pt is discharge to readmit.  

## 2017-03-05 NOTE — ED Notes (Addendum)
Pt to move to behavioral Holding unit to await  inpt bed assignment   She has been seen by [facility] - psych consultant   Plan of care discussed with her prior to move  IVC papers in place  - copies of IVC sent to holding unit staff  BEHAVIORAL HEALTH ROUNDING Patient sleeping: No. Patient alert and oriented: yes Behavior appropriate: Yes.  ; If no, describe:  Nutrition and fluids offered: yes Toileting and hygiene offered: Yes  Sitter present: q15 minute observations and security monitoring Law enforcement present: Yes  ODS

## 2017-03-05 NOTE — ED Triage Notes (Signed)
Pt states she has been seeing people that are not there that she cant understand, states "they are talking gibberish" states having suicidal thoughts. She lives with two other roommates

## 2017-03-05 NOTE — ED Provider Notes (Addendum)
[facility] Emergency Department Provider Note  ____________________________________________  Time seen: Approximately 10:14 AM  I have reviewed the triage vital signs and the nursing notes.   HISTORY  Chief Complaint Hallucinations and Suicidal    HPI Mercedes Forbes is a 46 y.o. female with a history of bipolar disorder, personality disorder, schizophrenia and polysubstance abuse presenting with auditory and visual hallucinations, suicidal ideations. The patient reports that for the past 2 days, she has had visual hallucinations and the people she is seeing are telling her to kill herself. She does state that she would like to harm herself but has no plan on how she would do this. She states that she has chronically been suicidal since the death of her husband 3-1/2 years ago. She denies any recent changes in her medications, they were last changed in April. Her hallucinations are generally well controlled on medications. She denies any acute medical conditions today.   Past Medical History:  Diagnosis Date  . Arthritis   . Bipolar 1 disorder (HCC)   . Headache    migraines  . Personality disorder   . Polysubstance abuse   . Schizophrenia [facility](HCC)     Patient Active Problem List   Diagnosis Date Noted  . Opiate abuse, episodic 07/22/2016  . PTSD (post-traumatic stress disorder) 04/04/2016  . Tobacco use disorder 04/03/2016  . Bipolar 2 disorder, major depressive episode (HCC) 04/02/2016  . Cocaine use disorder, moderate, dependence (HCC) 04/02/2016  . Cannabis use disorder, moderate, dependence (HCC) 04/02/2016    Past Surgical History:  Procedure Laterality Date  . KNEE ARTHROSCOPY WITH PATELLA RECONSTRUCTION     left patella removed  . wrists Bilateral     Current Outpatient Rx  . Order #: [id number] Class: Print  . Order #: [id number] Class: Print  . Order #: [id number] Class: Print  . Order #: [id number] Class: Print  . Order #: [id number] Class:  Print  . Order #: [id number] Class: Print    Allergies Aspirin; Flurbiprofen; and Nsaids  No family history on file.  Social History Social History  Substance Use Topics  . Smoking status: Current Every Day Smoker    Packs/day: 1.00  . Smokeless tobacco: Never Used     Comment: Nicotine patch was ordered  . Alcohol use No    Review of Systems Constitutional: No fever/chills. No lightheadedness or syncope. Eyes: No visual changes. ENT: No congestion or rhinorrhea. Cardiovascular: Denies chest pain. Denies palpitations. Respiratory: Denies shortness of breath.  No cough. Gastrointestinal: No abdominal pain.  No nausea, no vomiting.  No diarrhea.  No constipation. Genitourinary: Negative for dysuria. Musculoskeletal: Negative for back pain. Skin: Negative for rash. Neurological: Negative for headaches. No focal numbness, tingling or weakness.  Psychiatric:Positive auditory and visual hallucinations. No homicidal ideation. Positive suicidal ideation without plan.    ____________________________________________   PHYSICAL EXAM:  VITAL SIGNS: ED Triage Vitals  Enc Vitals Group     BP 03/05/17 0941 107/66     Pulse Rate 03/05/17 0941 87     Resp 03/05/17 0941 15     Temp 03/05/17 0941 98 F (36.7 C)     Temp Source 03/05/17 0941 Oral     SpO2 03/05/17 0941 97 %     Weight 03/05/17 0941 160 lb (72.6 kg)     Height 03/05/17 0941 5\' 8"  (1.727 m)     Head Circumference --      Peak Flow --      Pain Score 03/05/17 0943 7  Pain Loc --      Pain Edu? --      Excl. in GC? --     Constitutional: Alert and oriented. Well appearing and in no acute distress. Answers questions appropriately.The patient is lying down comfortably in the stretcher and is able to sit up and stand up without any difficulty. Eyes: Conjunctivae are normal.  EOMI. No scleral icterus. Head: Atraumatic. Nose: No congestion/rhinnorhea. Mouth/Throat: Mucous membranes are Mildly dry.  Neck: No  stridor.  Supple.  No JVD. No meningismus. Cardiovascular: Normal rate, regular rhythm. No murmurs, rubs or gallops.  Respiratory: Normal respiratory effort.  No accessory muscle use or retractions. Lungs CTAB.  No wheezes, rales or ronchi. Gastrointestinal: Soft, nontender and nondistended.  No guarding or rebound.  No peritoneal signs. Musculoskeletal: No LE edema.  Neurologic:  A&Ox3.  Speech is clear.  Face and smile are symmetric.  EOMI.  Moves all extremities well. Skin:  Skin is warm, dry and intact. No rash noted. Psychiatric: Patient has a depressed mood with a flat affect. Her speech is normal, without any pressured speech. She has good insight into why she is here. She does confirm suicidal ideation on my examination. ____________________________________________   LABS (all labs ordered are listed, but only abnormal results are displayed)  Labs Reviewed  COMPREHENSIVE METABOLIC PANEL - Abnormal; Notable for the following:       Result Value   CO2 21 (*)    Glucose, Bld 101 (*)    Creatinine, Ser 1.14 (*)    ALT 12 (*)    GFR calc non Af Amer 56 (*)    All other components within normal limits  ACETAMINOPHEN LEVEL - Abnormal; Notable for the following:    Acetaminophen (Tylenol), Serum <10 (*)    All other components within normal limits  CBC - Abnormal; Notable for the following:    WBC 11.4 (*)    All other components within normal limits  URINE DRUG SCREEN, QUALITATIVE ([facility] ONLY) - Abnormal; Notable for the following:    Cocaine Metabolite,Ur [place] POSITIVE (*)    Cannabinoid 50 Ng, Ur [place] POSITIVE (*)    Benzodiazepine, Ur Scrn POSITIVE (*)    All other components within normal limits  ETHANOL  SALICYLATE LEVEL  URINALYSIS, COMPLETE (UACMP) WITH MICROSCOPIC  POC URINE PREG, ED  POCT PREGNANCY, URINE   ____________________________________________  EKG  Not indicated ____________________________________________  RADIOLOGY  No results  found.  ____________________________________________   PROCEDURES  Procedure(s) performed: None  Procedures  Critical Care performed: No ____________________________________________   INITIAL IMPRESSION / ASSESSMENT AND PLAN / ED COURSE  Pertinent labs & imaging results that were available during my care of the patient were reviewed by me and considered in my medical decision making (see chart for details).  46 y.o. female with a history of schizophrenia, bipolar disorder, and personality disorder, polysubstance abuse, presenting with auditory and visual hallucinations and suicidal idea patients. At this time, the patient has no acute medical concerns and if her laboratory studies are reassuring, without will plan to medically clear her for psychiatric disposition. She does meet criteria for involuntary commitment at this time.  ----------------------------------------- 11:27 AM on 03/05/2017 -----------------------------------------  The patient does have mildly dry mucous membranes and a small bump in her creatinine today. I have asked the nurse to encourage her to drink plenty of fluid, as I think this is likely due to some mild dehydration. The patient does not warrant intravenous fluids as she is able to tolerate  oral liquids. She will need to have this rechecked by her primary care physician and is aware of this.   Her urine drug screen came back positive for multiple substances. The remainder of her laboratory studies are reassuring. At this time, the patient is medically cleared for psychiatric disposition. ____________________________________________  FINAL CLINICAL IMPRESSION(S) / ED DIAGNOSES  Final diagnoses:  Suicidal ideation  Auditory hallucinations  Visual hallucinations  Acute renal insufficiency  Polysubstance abuse         NEW MEDICATIONS STARTED DURING THIS VISIT:  New Prescriptions   No medications on file      Rockne Menghini,  MD 03/05/17 1022    Rockne Menghini, MD 03/05/17 1128

## 2017-03-05 NOTE — ED Notes (Signed)
PO intake encouraged

## 2017-03-05 NOTE — BH Assessment (Signed)
TTS interview complete. Clinician consulted with Dr.Clapacs and pt is recommended for inpatient tx. EDP (Dr. Sharma CovertNorman) and pt's RN (Amy) informed. Pt's [facility] BMU charge RN Marylu Lund(Janet for bed assignment).

## 2017-03-05 NOTE — Progress Notes (Signed)
46 year old female admitted to unit. Alert and oriented x4. Reports having passive SI, but currently denies at present. Patient verbally contracts for safety. Denies current HI, AVH. Patient reports that current stressors are her two roommates that she feels are freeloading off of her and the death of her spouse 3.5 years ago. Patient with sad affect, c/o L) knee pain, (encouraged patient to rest) the issue is recurrent and chronic after patella repair. Oriented patient to room and unit. Skin and contraband search completed and witnessed by Maryelizabeth KaufmannGigi, Charity fundraiserN. Multiple tattoos to body observed, skin otherwise warm, dry and intact. No contraband found. Home medications sent to pharmacy for storage. Admission assessment completed, fluid and nutrition offered. Patient remains safe on the unit with q 15 minute checks.

## 2017-03-05 NOTE — Consult Note (Signed)
[name] Consult   Reason for Consult:  Consult for 46 year old woman with a history of chronic mood symptoms and substance abuse comes in with suicidal ideation Referring Physician:  Mariea Clonts Patient Identification: Mercedes Forbes MRN:  [id number] Principal Diagnosis: Bipolar 2 disorder, major depressive episode [facility]) Diagnosis:   Patient Active Problem List   Diagnosis Date Noted  . 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 PTSD (post-traumatic stress disorder) [F43.10] 04/04/2016  . Tobacco use disorder [F17.200] 04/03/2016  . Bipolar 2 disorder, major depressive episode ([place]) [F31.81] 04/02/2016  . Cocaine use disorder, moderate, dependence ([place]) [F14.20] 04/02/2016  . Cannabis use disorder, moderate, dependence ([place]) [F12.20] 04/02/2016    Total Time spent with patient: 1 hour  Subjective:   Mercedes Forbes is a 46 y.o. female patient admitted with "I've been having suicidal thoughts".  HPI:  Patient interviewed chart reviewed. Case reviewed with emergency room physician and TTS. 46 year old woman came into the emergency room reporting she's been having active suicidal thoughts. It's been bad for at least a week and getting worse. She is also having auditory hallucinations that are frightening to her. She can't always tell what they are saying but they make her nervous. They happen more when she is by herself or at nighttime. She has been having thoughts about doing something to try to hurt yourself. Major stresses include that the anniversary of her marriage is coming up soon. Her husband committed suicide about 3 years ago and ever since then the anniversary has been a stress for her. Also she lives with some roommates and feels like they are financially taking advantage of her which is a major stress. Her sleep has been poor. She denies that she's using any alcohol but admits she continues to use cocaine and marijuana but minimizes the amount saying only it's about  1-2 times a month. She is currently seeing Dr. Randel Books for outpatient psychiatric treatment but not a therapist. Compliant with her medicine.  Social history: Patient lives in her own house. Does get disability. She has 2 or 3 other roommates living with her however who contributed nothing to the household and she feels they are taking advantage of her financially. She doesn't feel like her check is enough to take care of them and she never has food on hand. Patient was married husband died of suicide about 3-1/2 years ago.  Medical history: Denies any medical problems outside of her current psychiatric illness.  Substance abuse history: Patient has an established history of abuse of cocaine and marijuana. Says that she has been trying to keep it under control but admits she is smoking crack a couple times a month and still regularly uses marijuana.  Past Psychiatric History: Patient sees Dr. Randel Books at [facility]. Has medication management. Denies any current psychotherapy. She has had prior hospitalizations and was here last about 2 months ago. Does have a history of suicide attempts in the past. Diagnosis of bipolar type II.  Risk to Self: Suicidal Ideation: Yes-Currently Present Suicidal Intent: Yes-Currently Present Is patient at risk for suicide?: Yes Suicidal Plan?: Yes-Currently Present Specify Current Suicidal Plan: Not Specified Access to Means:  (Not Specified) What has been your use of drugs/alcohol within the last 12 months?: Pt reports cocaine use. How many times?:  (" a bunch") Other Self Harm Risks: anniversary of husbands suicide Triggers for Past Attempts: Unknown Intentional Self Injurious Behavior: Cutting Comment - Self Injurious Behavior: Pt reports she last cut 12/2016 Risk to Others: Homicidal  Ideation: No Thoughts of Harm to Others: No Current Homicidal Intent: No Current Homicidal Plan: No Access to Homicidal Means: No Assessment of Violence: None Noted Does patient have  access to weapons?: No Criminal Charges Pending?: Yes Describe Pending Criminal Charges: Misd. Larceny, Probation violation Does patient have a court date:  (7/9, 7/19) Prior Inpatient Therapy: Prior Inpatient Therapy: Yes Prior Therapy Dates: Mulitiple, last admitted4/2018 Prior Therapy Facilty/Provider([facility]): [facility] Reason for Treatment: SI/ Prior Outpatient Therapy: Prior Outpatient Therapy: Yes Prior Therapy Dates: Ongoing Prior Therapy Facilty/Provider(s): [facility] Reason for Treatment: Medication Managment Does patient have an ACCT team?: No Does patient have Intensive In-House Services?  : No Does patient have [facility] services? : No Does patient have P4CC services?: No  Past Medical History:  Past Medical History:  Diagnosis Date  . Arthritis   . Bipolar 1 disorder ([place])   . Headache    migraines  . Personality disorder   . Polysubstance abuse   . Schizophrenia [facility])     Past Surgical History:  Procedure Laterality Date  . KNEE ARTHROSCOPY WITH PATELLA RECONSTRUCTION     left patella removed  . wrists Bilateral    Family History: No family history on file. Family Psychiatric  History: Positive for mood disorder and anxiety Social History:  History  Alcohol Use No     History  Drug Use  . Types: Cocaine    Comment: last 1 month ago    Social History   Social History  . Marital status: Widowed    Spouse name: N/A  . Number of children: N/A  . Years of education: N/A   Social History Main Topics  . Smoking status: Current Every Day Smoker    Packs/day: 1.00  . Smokeless tobacco: Never Used     Comment: Nicotine patch was ordered  . Alcohol use No  . Drug use: Yes    Types: Cocaine     Comment: last 1 month ago  . Sexual activity: No   Other Topics Concern  . None   Social History Narrative  . None   Additional Social History:    Allergies:   Allergies  Allergen Reactions  . Aspirin Other (See Comments) and Shortness Of Breath    Shortness of  breath like an asthma attack  . Flurbiprofen Shortness Of Breath  . Nsaids Shortness Of Breath    Labs:  Results for orders placed or performed during the hospital encounter of 03/05/17 (from the past 48 hour(s))  Comprehensive metabolic panel     Status: Abnormal   Collection Time: 03/05/17  9:49 AM  Result Value Ref Range   Sodium 137 135 - 145 mmol/L   Potassium 3.5 3.5 - 5.1 mmol/L   Chloride 105 101 - 111 mmol/L   CO2 21 (L) 22 - 32 mmol/L   Glucose, Bld 101 (H) 65 - 99 mg/dL   BUN 7 6 - 20 mg/dL   Creatinine, Ser 1.14 (H) 0.44 - 1.00 mg/dL   Calcium 9.1 8.9 - 10.3 mg/dL   Total Protein 7.8 6.5 - 8.1 g/dL   Albumin 4.4 3.5 - 5.0 g/dL   AST 30 15 - 41 U/L   ALT 12 (L) 14 - 54 U/L   Alkaline Phosphatase 75 38 - 126 U/L   Total Bilirubin 0.5 0.3 - 1.2 mg/dL   GFR calc non Af Amer 56 (L) >60 mL/min   GFR calc Af Amer >60 >60 mL/min    Comment: (NOTE) The eGFR has been calculated  using the CKD EPI equation. This calculation has not been validated in all clinical situations. eGFR's persistently <60 mL/min signify possible Chronic Kidney Disease.    Anion gap 11 5 - 15  Ethanol     Status: None   Collection Time: 03/05/17  9:49 AM  Result Value Ref Range   Alcohol, Ethyl (B) <5 <5 mg/dL    Comment:        LOWEST DETECTABLE LIMIT FOR SERUM ALCOHOL IS 5 mg/dL FOR MEDICAL PURPOSES ONLY   Salicylate level     Status: None   Collection Time: 03/05/17  9:49 AM  Result Value Ref Range   Salicylate Lvl <1.9 2.8 - 30.0 mg/dL  Acetaminophen level     Status: Abnormal   Collection Time: 03/05/17  9:49 AM  Result Value Ref Range   Acetaminophen (Tylenol), Serum <10 (L) 10 - 30 ug/mL    Comment:        THERAPEUTIC CONCENTRATIONS VARY SIGNIFICANTLY. A RANGE OF 10-30 ug/mL MAY BE AN EFFECTIVE CONCENTRATION FOR MANY PATIENTS. HOWEVER, SOME ARE BEST TREATED AT CONCENTRATIONS OUTSIDE THIS RANGE. ACETAMINOPHEN CONCENTRATIONS >150 ug/mL AT 4 HOURS AFTER INGESTION AND >50 ug/mL  AT 12 HOURS AFTER INGESTION ARE OFTEN ASSOCIATED WITH TOXIC REACTIONS.   cbc     Status: Abnormal   Collection Time: 03/05/17  9:49 AM  Result Value Ref Range   WBC 11.4 (H) 3.6 - 11.0 K/uL   RBC 4.39 3.80 - 5.20 MIL/uL   Hemoglobin 13.7 12.0 - 16.0 g/dL   HCT 40.4 35.0 - 47.0 %   MCV 91.9 80.0 - 100.0 fL   MCH 31.1 26.0 - 34.0 pg   MCHC 33.9 32.0 - 36.0 g/dL   RDW 13.9 11.5 - 14.5 %   Platelets 216 150 - 440 K/uL  Urine Drug Screen, Qualitative     Status: Abnormal   Collection Time: 03/05/17  9:49 AM  Result Value Ref Range   Tricyclic, Ur Screen NONE DETECTED NONE DETECTED   Amphetamines, Ur Screen NONE DETECTED NONE DETECTED   MDMA (Ecstasy)Ur Screen NONE DETECTED NONE DETECTED   Cocaine Metabolite,Ur [place] POSITIVE (A) NONE DETECTED   Opiate, Ur Screen NONE DETECTED NONE DETECTED   Phencyclidine (PCP) Ur S NONE DETECTED NONE DETECTED   Cannabinoid 50 Ng, Ur [employer] POSITIVE (A) NONE DETECTED   Barbiturates, Ur Screen NONE DETECTED NONE DETECTED   Benzodiazepine, Ur Scrn POSITIVE (A) NONE DETECTED   Methadone Scn, Ur NONE DETECTED NONE DETECTED    Comment: (NOTE) 166  Tricyclics, urine               Cutoff 1000 ng/mL 200  Amphetamines, urine             Cutoff 1000 ng/mL 300  MDMA (Ecstasy), urine           Cutoff 500 ng/mL 400  Cocaine Metabolite, urine       Cutoff 300 ng/mL 500  Opiate, urine                   Cutoff 300 ng/mL 600  Phencyclidine (PCP), urine      Cutoff 25 ng/mL 700  Cannabinoid, urine              Cutoff 50 ng/mL 800  Barbiturates, urine             Cutoff 200 ng/mL 900  Benzodiazepine, urine           Cutoff 200 ng/mL 1000 Methadone, urine  Cutoff 300 ng/mL 1100 1200 The urine drug screen provides only a preliminary, unconfirmed 1300 analytical test result and should not be used for non-medical 1400 purposes. Clinical consideration and professional judgment should 1500 be applied to any positive drug screen result due to possible 1600  interfering substances. A more specific alternate chemical method 1700 must be used in order to obtain a confirmed analytical result.  1800 Gas chromato graphy / mass spectrometry (GC/MS) is the preferred 1900 confirmatory method.   Urinalysis, Complete w Microscopic     Status: Abnormal   Collection Time: 03/05/17  9:49 AM  Result Value Ref Range   Color, Urine STRAW (A) YELLOW   APPearance CLEAR (A) CLEAR   Specific Gravity, Urine 1.004 (L) 1.005 - 1.030   pH 6.0 5.0 - 8.0   Glucose, UA NEGATIVE NEGATIVE mg/dL   Hgb urine dipstick SMALL (A) NEGATIVE   Bilirubin Urine NEGATIVE NEGATIVE   Ketones, ur NEGATIVE NEGATIVE mg/dL   Protein, ur NEGATIVE NEGATIVE mg/dL   Nitrite NEGATIVE NEGATIVE   Leukocytes, UA NEGATIVE NEGATIVE   RBC / HPF 0-5 0 - 5 RBC/hpf   WBC, UA 0-5 0 - 5 WBC/hpf   Bacteria, UA FEW (A) NONE SEEN   Squamous Epithelial / LPF 0-5 (A) NONE SEEN   Mucous PRESENT   Pregnancy, urine POC     Status: None   Collection Time: 03/05/17 10:01 AM  Result Value Ref Range   Preg Test, Ur NEGATIVE NEGATIVE    Comment:        THE SENSITIVITY OF THIS METHODOLOGY IS >24 mIU/mL     No current facility-administered medications for this encounter.    Current Outpatient Prescriptions  Medication Sig Dispense Refill  . ARIPiprazole (ABILIFY) 20 MG tablet Take 1 tablet (20 mg total) by mouth daily. 30 tablet 0  . chlordiazePOXIDE (LIBRIUM) 25 MG capsule Take 1 capsule (25 mg total) by mouth at bedtime. 30 capsule 0  . lithium carbonate (LITHOBID) 300 MG CR tablet Take 2 tablets (600 mg total) by mouth at bedtime. 60 tablet 0  . prazosin (MINIPRESS) 2 MG capsule Take 1 capsule (2 mg total) by mouth at bedtime. (Patient taking differently: Take 2 mg by mouth 2 (two) times daily. ) 30 capsule 0  . traZODone (DESYREL) 100 MG tablet Take 1 tablet (100 mg total) by mouth at bedtime. 30 tablet 0  . venlafaxine XR (EFFEXOR-XR) 75 MG 24 hr capsule Take 3 capsules (225 mg total) by mouth  daily with breakfast. 90 capsule 0    Musculoskeletal: Strength & Muscle Tone: within normal limits Gait & Station: normal Patient leans: N/A  Psychiatric Specialty Exam: Physical Exam  Nursing note and vitals reviewed. Constitutional: She appears well-developed and well-nourished.  HENT:  Head: Normocephalic and atraumatic.  Eyes: Conjunctivae are normal. Pupils are equal, round, and reactive to light.  Neck: Normal range of motion.  Cardiovascular: Regular rhythm and normal heart sounds.   Respiratory: Effort normal and breath sounds normal. No respiratory distress.  GI: Soft.  Musculoskeletal: Normal range of motion.  Neurological: She is alert.  Skin: Skin is warm and dry.  Psychiatric: Her affect is blunt. Her speech is delayed. She is slowed and withdrawn. Cognition and memory are impaired. She expresses impulsivity. She exhibits a depressed mood. She expresses suicidal ideation. She expresses suicidal plans.    Review of Systems  Constitutional: Negative.   HENT: Negative.   Eyes: Negative.   Respiratory: Negative.   Cardiovascular: Negative.  Gastrointestinal: Negative.   Musculoskeletal: Negative.   Skin: Negative.   Neurological: Negative.   Psychiatric/Behavioral: Positive for depression, hallucinations, substance abuse and suicidal ideas. Negative for memory loss. The patient is nervous/anxious and has insomnia.     Blood pressure 107/66, pulse 87, temperature 98 F (36.7 C), temperature source Oral, resp. rate 15, height _0  (1.727 m), weight 72.6 kg (160 lb), SpO2 97 %.Body mass index is 24.33 kg/m.  General Appearance: Disheveled  Eye Contact:  Fair  Speech:  Slow  Volume:  Decreased  Mood:  Depressed  Affect:  Congruent  Thought Process:  Goal Directed  Orientation:  Full (Time, Place, and Person)  Thought Content:  Logical and Hallucinations: Auditory  Suicidal Thoughts:  Yes.  with intent/plan  Homicidal Thoughts:  No  Memory:  Immediate;    Fair Recent;   Fair Remote;   Fair  Judgement:  Fair  Insight:  Fair  Psychomotor Activity:  Decreased  Concentration:  Concentration: Fair  Recall:  [employer] of Knowledge:  Fair  Language:  Fair  Akathisia:  No  Handed:  Right  AIMS (if indicated):     Assets:  Desire for Improvement Financial Resources/Insurance Housing Resilience  ADL's:  Intact  Cognition:  WNL  Sleep:        Treatment Plan Summary: Daily contact with patient to assess and evaluate symptoms and progress in treatment, Medication management and Plan 46 year old woman with a history of depression versus bipolar 2 presents with multiple symptoms of depression and active suicidal thoughts. Patient feels like she is unsafe at home. Continue IVC. Orders will be done to admit her to the inpatient unit. Continue outpatient psychiatric medicine as prescribed. Case reviewed with emergency room physician and TTS. Full set of labs to be obtained.  Disposition: Recommend psychiatric Inpatient admission when medically cleared. Supportive therapy provided about ongoing stressors.  Alethia Berthold, MD 03/05/2017 12:28 PM

## 2017-03-05 NOTE — ED Notes (Signed)
Pt pleasant, calm and cooperative. Pt endorses SI, no plan.  Pt also reports AVH. Pt stated she hears voices and sees people, but cannot always make out what they are saying. Pt given soft drink. No further needs or concerns at this time. Maintained on 15 minute checks and observation by security camera for safety.

## 2017-03-05 NOTE — ED Notes (Signed)
Pt discharged to BMU. Pt under IVC. All belongings sent with patient.  Pt accepting.

## 2017-03-05 NOTE — ED Notes (Signed)

## 2017-03-05 NOTE — Tx Team (Addendum)
Initial Treatment Plan 03/05/2017 5:00 PM Mercedes Forbes Gertner QMV:[id number]    PATIENT STRESSORS: Loss of spouse Substance abuse   PATIENT STRENGTHS: Ability for insight Motivation for treatment/growth   PATIENT IDENTIFIED PROBLEMS: Bipolar Disorder  Personality Disorder  Depression  Suicidal Ideation               DISCHARGE CRITERIA:  Improved stabilization in mood, thinking, and/or behavior  PRELIMINARY DISCHARGE PLAN: Outpatient therapy  PATIENT/FAMILY INVOLVEMENT: This treatment plan has been presented to and reviewed with the patient, Mercedes Forbes Heinle, and/or family member,  .  The patient and family have been given the opportunity to ask questions and make suggestions.  Daviona Herbert L Aaylah Pokorny, RN 03/05/2017, 5:00 PM

## 2017-03-05 NOTE — ED Notes (Signed)
PT IVC PENDING CONSULT  
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
## 2017-03-05 NOTE — BH Assessment (Signed)
[facility] Toni Amendlapacs.  Attending Physician will be Dr. Ardyth HarpsHernandez.   [facility] Marylu LundJanet.   ER staff is aware of the admission Rivka Barbara( Glenda, ER Isabella StallingSect. ; Valerie, Patient's Nurse & Ivin BootyJoshua, Patient Access).  Clinician unable to reach EDP (Dr.Norman at this time)  IVC pending.

## 2017-03-05 NOTE — ED Notes (Signed)
Pt compliant with medication.  Pt accepting of admission to BMU this afternoon. No needs or concerns at this time. Pt laying in bed resting. Maintained on 15 minute checks and observation by security camera for safety.

## 2017-03-06 DIAGNOSIS — F3181 Bipolar II disorder: Secondary | ICD-10-CM

## 2017-03-06 LAB — TSH: TSH: 7.13 u[IU]/mL — ABNORMAL HIGH (ref 0.350–4.500)

## 2017-03-06 LAB — LIPID PANEL
CHOL/HDL RATIO: 5.1 ratio
CHOLESTEROL: 214 mg/dL — AB (ref 0–200)
HDL: 42 mg/dL (ref >40)
LDL Cholesterol: 132 mg/dL — ABNORMAL HIGH (ref 0–99)
Triglycerides: 202 mg/dL — ABNORMAL HIGH (ref <150)
VLDL: 40 mg/dL (ref 0–40)

## 2017-03-06 MED ORDER — NICOTINE 7 MG/24HR TD PT24
7.0000 mg | MEDICATED_PATCH | Freq: Every day | TRANSDERMAL | Status: DC
  Administered 2017-03-06 – 2017-03-08 (×3): 7 mg via TRANSDERMAL
  Filled 2017-03-06 (×3): qty 1

## 2017-03-06 MED ORDER — ENSURE ENLIVE PO LIQD
237.0000 mL | Freq: Two times a day (BID) | ORAL | Status: DC
  Administered 2017-03-06 – 2017-03-08 (×5): 237 mL via ORAL

## 2017-03-06 NOTE — Plan of Care (Signed)
Problem: Safety: Goal: Ability to remain free from injury will improve Outcome: Progressing Pt displays safe behaviors while in hospital commits to safety.    

## 2017-03-06 NOTE — Tx Team (Signed)
Interdisciplinary Treatment and Diagnostic Plan Update  03/06/2017 Time of Session: 11:35AM Mercedes Forbes MRN: [id number]  Principal Diagnosis: Bipolar 2 disorder, major depressive episode (HCC)  Secondary Diagnoses: Principal Problem:   Bipolar 2 disorder, major depressive episode (HCC) Active Problems:   Cocaine use disorder, moderate, dependence (HCC)   Cannabis use disorder, moderate, dependence (HCC)   Tobacco use disorder   PTSD (post-traumatic stress disorder)   Opiate abuse, episodic   h/o Borderline personality disorder   Current Medications:  Current Facility-Administered Medications  Medication Dose Route Frequency Provider Last Rate Last Dose  . acetaminophen (TYLENOL) tablet 650 mg  650 mg Oral Q6H PRN Clapacs, John T, MD      . alum & mag hydroxide-simeth (MAALOX/MYLANTA) 200-200-20 MG/5ML suspension 30 mL  30 mL Oral Q4H PRN Clapacs, John T, MD      . ARIPiprazole (ABILIFY) tablet 30 mg  30 mg Oral Daily Clapacs, Jackquline Denmark, MD   30 mg at 03/06/17 0741  . feeding supplement (ENSURE ENLIVE) (ENSURE ENLIVE) liquid 237 mL  237 mL Oral BID BM Hernandez-Gonzalez, Sue Lush, MD      . lithium carbonate (LITHOBID) CR tablet 600 mg  600 mg Oral QHS Clapacs, John T, MD   600 mg at 03/05/17 2215  . magnesium hydroxide (MILK OF MAGNESIA) suspension 30 mL  30 mL Oral Daily PRN Clapacs, John T, MD      . nicotine (NICODERM CQ - dosed in mg/24 hr) patch 7 mg  7 mg Transdermal Daily [name], MD   7 mg at 03/06/17 1042  . prazosin (MINIPRESS) capsule 2 mg  2 mg Oral QHS Clapacs, John T, MD      . traZODone (DESYREL) tablet 100 mg  100 mg Oral QHS Clapacs, Jackquline Denmark, MD   100 mg at 03/05/17 2215  . venlafaxine XR (EFFEXOR-XR) 24 hr capsule 225 mg  225 mg Oral Q breakfast Clapacs, Jackquline Denmark, MD   225 mg at 03/06/17 4098   PTA Medications: Prescriptions Prior to Admission  Medication Sig Dispense Refill Last Dose  . ARIPiprazole (ABILIFY) 20 MG tablet Take 1 tablet (20 mg  total) by mouth daily. 30 tablet 0 Past Week at Unknown time  . chlordiazePOXIDE (LIBRIUM) 25 MG capsule Take 1 capsule (25 mg total) by mouth at bedtime. 30 capsule 0 Past Week at Unknown time  . lithium carbonate (LITHOBID) 300 MG CR tablet Take 2 tablets (600 mg total) by mouth at bedtime. 60 tablet 0 Past Week at Unknown time  . prazosin (MINIPRESS) 2 MG capsule Take 1 capsule (2 mg total) by mouth at bedtime. (Patient taking differently: Take 2 mg by mouth 2 (two) times daily. ) 30 capsule 0 Past Week at Unknown time  . traZODone (DESYREL) 100 MG tablet Take 1 tablet (100 mg total) by mouth at bedtime. 30 tablet 0 Past Week at Unknown time  . venlafaxine XR (EFFEXOR-XR) 75 MG 24 hr capsule Take 3 capsules (225 mg total) by mouth daily with breakfast. 90 capsule 0 Past Week at Unknown time    Patient Stressors: Loss of spouse Substance abuse  Patient Strengths: Ability for insight Motivation for treatment/growth  Treatment Modalities: Medication Management, Group therapy, Case management,  1 to 1 session with clinician, Psychoeducation, Recreational therapy.   Physician Treatment Plan for Primary Diagnosis: Bipolar 2 disorder, major depressive episode (HCC) Long Term Goal(s): Improvement in symptoms so as ready for discharge Improvement in symptoms so as ready for discharge   Short Term  Goals: Ability to identify changes in lifestyle to reduce recurrence of condition will improve Ability to demonstrate self-control will improve Ability to identify and develop effective coping behaviors will improve Ability to identify triggers associated with substance abuse/mental health issues will improve  Medication Management: Evaluate patient's response, side effects, and tolerance of medication regimen.  Therapeutic Interventions: 1 to 1 sessions, Unit Group sessions and Medication administration.  Evaluation of Outcomes: Progressing  Physician Treatment Plan for Secondary Diagnosis:  Principal Problem:   Bipolar 2 disorder, major depressive episode (HCC) Active Problems:   Cocaine use disorder, moderate, dependence (HCC)   Cannabis use disorder, moderate, dependence (HCC)   Tobacco use disorder   PTSD (post-traumatic stress disorder)   Opiate abuse, episodic   h/o Borderline personality disorder  Long Term Goal(s): Improvement in symptoms so as ready for discharge Improvement in symptoms so as ready for discharge   Short Term Goals: Ability to identify changes in lifestyle to reduce recurrence of condition will improve Ability to demonstrate self-control will improve Ability to identify and develop effective coping behaviors will improve Ability to identify triggers associated with substance abuse/mental health issues will improve     Medication Management: Evaluate patient's response, side effects, and tolerance of medication regimen.  Therapeutic Interventions: 1 to 1 sessions, Unit Group sessions and Medication administration.  Evaluation of Outcomes: Progressing   RN Treatment Plan for Primary Diagnosis: Bipolar 2 disorder, major depressive episode (HCC) Long Term Goal(s): Knowledge of disease and therapeutic regimen to maintain health will improve  Short Term Goals: Ability to demonstrate self-control, Ability to disclose and discuss suicidal ideas and Compliance with prescribed medications will improve  Medication Management: RN will administer medications as ordered by provider, will assess and evaluate patient's response and provide education to patient for prescribed medication. RN will report any adverse and/or side effects to prescribing provider.  Therapeutic Interventions: 1 on 1 counseling sessions, Psychoeducation, Medication administration, Evaluate responses to treatment, Monitor vital signs and CBGs as ordered, Perform/monitor CIWA, COWS, AIMS and Fall Risk screenings as ordered, Perform wound care treatments as ordered.  Evaluation of  Outcomes: Progressing   LCSW Treatment Plan for Primary Diagnosis: Bipolar 2 disorder, major depressive episode (HCC) Long Term Goal(s): Safe transition to appropriate next level of care at discharge, Engage patient in therapeutic group addressing interpersonal concerns.  Short Term Goals: Engage patient in aftercare planning with referrals and resources, Increase social support and Increase emotional regulation  Therapeutic Interventions: Assess for all discharge needs, 1 to 1 time with Social worker, Explore available resources and support systems, Assess for adequacy in community support network, Educate family and significant other(s) on suicide prevention, Complete Psychosocial Assessment, Interpersonal group therapy.  Evaluation of Outcomes: Progressing   Progress in Treatment: Attending groups: No. Participating in groups: No. Taking medication as prescribed: Yes. Toleration medication: Yes. Family/Significant other contact made: No, will contact:  CSW assessing appropriate contact. Patient understands diagnosis: Yes. Discussing patient identified problems/goals with staff: Yes. Medical problems stabilized or resolved: Yes. Denies suicidal/homicidal ideation: Yes. Issues/concerns per patient self-inventory: No.  New problem(s) identified: No, Describe:  None identified.  New Short Term/Long Term Goal(s): Patient stated that her goal is to get stabilized with right medications.  Discharge Plan or Barriers: Patient will discharge to her home and f/u with outpatient providers.  Reason for Continuation of Hospitalization: Anxiety Depression Suicidal ideation  Estimated Length of Stay: D/C Friday   Attendees: Patient: Mercedes Forbes  03/06/2017 11:48 AM  Physician: Dr. Radene JourneyAndrea Hernandez, MD  03/06/2017 11:48 AM  Nursing:  03/06/2017 11:48 AM  RN Care Manager: 03/06/2017 11:48 AM  Social Worker: Hampton Abbot, MSW, LCSW-A 03/06/2017 11:48 AM  Recreational Therapist:  03/06/2017  11:48 AM  Other: Jeanann Lewandowsky, PA Student  03/06/2017 11:48 AM  Other:  03/06/2017 11:48 AM  Other: 03/06/2017 11:48 AM    Scribe for Treatment Team: Lynden Oxford, LCSWA 03/06/2017 11:48 AM

## 2017-03-06 NOTE — Plan of Care (Signed)
Problem: Coping: Goal: Ability to verbalize feelings will improve Outcome: Progressing Patient verbalized feelings to staff.    
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
## 2017-03-06 NOTE — Progress Notes (Signed)
Pt reports having intermittent SI and she commits to safety.Pt has flat affect. She denies HI and a/v hallucinations. Pt is medication and meal compliant. She is not group compliant but visible on unit at times. RN had pt sign in voluntary document placed in front of chart. Will continue to monitor for safety.

## 2017-03-06 NOTE — [facility] Suicide Risk Assessment (Signed)
[facility]   Nursing information obtained from:  Patient Demographic factors:  Divorced or widowed, Caucasian, Low socioeconomic status Current Mental Status:  Suicidal ideation indicated by patient Loss Factors:  Loss of significant relationship, Financial problems / change in socioeconomic status Historical Factors:  Family history of mental illness or substance abuse, Family history of suicide Risk Reduction Factors:  NA   Principal Problem: Bipolar 2 disorder, major depressive episode (HCC) Diagnosis:   Patient Active Problem List   Diagnosis Date Noted  . 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 PTSD (post-traumatic stress disorder) [F43.10] 04/04/2016  . Tobacco use disorder [F17.200] 04/03/2016  . Bipolar 2 disorder, major depressive episode (HCC) [F31.81] 04/02/2016  . Cocaine use disorder, moderate, dependence (HCC) [F14.20] 04/02/2016  . Cannabis use disorder, moderate, dependence (HCC) [F12.20] 04/02/2016   Subjective Data:   Continued Clinical Symptoms:  Alcohol Use Disorder Identification Test Final Score (AUDIT): 1 The "Alcohol Use Disorders Identification Test", Guidelines for Use in Primary Care, Second Edition.  World Science writerHealth Organization [facility](WHO). Score between 0-7:  no or low risk or alcohol related problems. Score between 8-15:  moderate risk of alcohol related problems. Score between 16-19:  high risk of alcohol related problems. Score 20 or above:  warrants further diagnostic evaluation for alcohol dependence and treatment.   CLINICAL FACTORS:   Severe Anxiety and/or Agitation Depression:   Impulsivity Alcohol/Substance Abuse/Dependencies Previous Psychiatric Diagnoses and Treatments    Psychiatric Specialty Exam: Physical Exam  ROS  Blood pressure 93/64, pulse 68, temperature 98 F (36.7 C), temperature source Oral, resp. rate 18, height 5\' 8"  (1.727 m), weight 75.3 kg (166 lb), last menstrual period 08/17/2016, SpO2 97  %.Body mass index is 25.24 kg/m.                                                    Sleep:  Number of Hours: 8.15      COGNITIVE FEATURES THAT CONTRIBUTE TO RISK:  Polarized thinking    SUICIDE RISK:   Moderate:  Frequent suicidal ideation with limited intensity, and duration, some specificity in terms of plans, no associated intent, good self-control, limited dysphoria/symptomatology, some risk factors present, and identifiable protective factors, including available and accessible social support.  [facility]  I certify that inpatient services furnished can reasonably be expected to improve the patient's condition.   Jimmy FootmanHernandez-Gonzalez,  Youcef Klas, MD 03/06/2017, 9:07 AM

## 2017-03-06 NOTE — Progress Notes (Signed)
Initial Nutrition Assessment  DOCUMENTATION CODES:   Severe malnutrition in context of social or environmental circumstances  INTERVENTION:  Recommend Ensure Enlive po BID, each supplement provides 350 kcal and 20 grams of protein.   Encouraged adequate intake of calories and protein at meals to help prevent further unintentional weight loss.  Patient reports she is going to try to find more reliable transportation to access the food pantries and food kitchens in [place].   NUTRITION DIAGNOSIS:   Malnutrition (Severe) related to social / environmental circumstances (limited access to food, polysubstance abuse) as evidenced by energy intake < or equal to 50% for > or equal to 1 month, 10.1 percent weight loss over 2 months.  GOAL:   Patient will meet greater than or equal to 90% of their needs  MONITOR:   PO intake, Supplement acceptance, Labs, Weight trends, I & O's  REASON FOR ASSESSMENT:   Malnutrition Screening Tool    ASSESSMENT:   46 year old female with PMHx of arthritis, bipolar 1 disorder, schizophrenia, personality disorder, polysubstance abuse presents with suicidal ideation and auditory hallucinations.   -Urine toxicology positive for benzodiazepine, cocaine, cannabinoid.  Spoke with patient at bedside. She reports she has not been eating well for about 2 months now. She reports it is mainly related to limited food access. She is a participant of SNAP (formerly food stamps), but only receives $15/month. Does not receive enough income to cover food costs. Patient reports she knows where there are food kitchens and food pantries in [place], but does not have a reliable method of transportation to get there. Patient reports at best she is eating one meal per day now. Sometimes she will go days without eating (up to 3-4) and only be able to drink water on those days. Patient reports she is eating pretty well here. She is amenable to drinking Ensure to help  prevent further unintentional weight loss. Denies any N/V or abdominal pain.  Patient reports she has lost approximately 18 lbs (10.1% body wight) over the past 2 months, which is significant for time frame. Does endorse she had weighed less at the end of last year and had gained to weight of 178 lbs in 12/2016. Reports she is now 160 lbs.  Meal Completion: 0% of dinner last night, 100% of breakfast this morning per chart  Medications reviewed and include: lithium carbonate.   Labs reviewed: Creatinine 1.14, Cholesterol 214, LDL 132, TG 202.   Diet Order:  Diet regular Room service appropriate? Yes; Fluid consistency: Thin  Skin:  Reviewed, no issues  Last BM:  PTA (03/04/2017)  Height:   Ht Readings from Last 1 Encounters:  03/05/17 5\' 8"  (1.727 m)    Weight:   Wt Readings from Last 1 Encounters:  03/05/17 166 lb (75.3 kg)    Ideal Body Weight:  63.6 kg  BMI:  Body mass index is 25.24 kg/m.  Estimated Nutritional Needs:   Kcal:  [id number]  Protein:  75-90 grams  Fluid:  2.2 L/day   EDUCATION NEEDS:   No education needs identified at this time  Helane RimaLeanne [employer]ford Schools, MS, RD, LDN Pager: [phone] After Hours Pager: [phone]

## 2017-03-06 NOTE — [facility] Group Notes (Signed)
  [facility]  Date/Time:03/06/2017 9:30am   Type of Therapy/Topic:  Group Therapy:  Emotion Regulation  Participation Level:  Did Not Attend    Glennon MacSara P Caymen Dubray, LCSW 03/06/2017, 3:08 PM

## 2017-03-07 LAB — BASIC METABOLIC PANEL
ANION GAP: 4 — AB (ref 5–15)
BUN: 15 mg/dL (ref 6–20)
CALCIUM: 9.1 mg/dL (ref 8.9–10.3)
CO2: 28 mmol/L (ref 22–32)
Chloride: 105 mmol/L (ref 101–111)
Creatinine, Ser: 1.01 mg/dL — ABNORMAL HIGH (ref 0.44–1.00)
GFR calc Af Amer: >60 mL/min (ref >60)
GLUCOSE: 82 mg/dL (ref 65–99)
Potassium: 3.9 mmol/L (ref 3.5–5.1)
SODIUM: 137 mmol/L (ref 135–145)

## 2017-03-07 LAB — HEMOGLOBIN A1C
HEMOGLOBIN A1C: 5.1 % (ref 4.8–5.6)
Mean Plasma Glucose: 100 mg/dL

## 2017-03-07 LAB — LITHIUM LEVEL: LITHIUM LVL: 0.79 mmol/L (ref 0.60–1.20)

## 2017-03-07 LAB — PROLACTIN: PROLACTIN: 23 ng/mL (ref 4.8–23.3)

## 2017-03-07 MED ORDER — HYDROXYZINE HCL 25 MG PO TABS
25.0000 mg | ORAL_TABLET | Freq: Four times a day (QID) | ORAL | Status: DC | PRN
  Administered 2017-03-07 (×2): 25 mg via ORAL
  Filled 2017-03-07 (×2): qty 1

## 2017-03-07 MED ORDER — LITHIUM CARBONATE ER 450 MG PO TBCR
450.0000 mg | EXTENDED_RELEASE_TABLET | Freq: Every day | ORAL | Status: DC
  Administered 2017-03-07: 450 mg via ORAL
  Filled 2017-03-07: qty 1

## 2017-03-07 MED ORDER — LITHIUM CARBONATE ER 450 MG PO TBCR: 450.0000 mg | EXTENDED_RELEASE_TABLET | Freq: Every day | ORAL | 0 refills | Status: DC

## 2017-03-07 NOTE — Progress Notes (Signed)
Affect flat.  Continues to endorse depression.  Denies SI.  Stays to self.  Medication compliant.  Support offered.  Safety checks maintained.

## 2017-03-07 NOTE — Plan of Care (Signed)
Problem: Coping: Goal: Ability to verbalize feelings will improve Outcome: Progressing Patient verbalized feelings to staff.    

## 2017-03-07 NOTE — B[facility] Suicide Risk Assessment (Addendum)
[facility]   Principal Problem: Bipolar 2 disorder, major depressive episode [facility](HCC) Discharge Diagnoses:  Patient Active Problem List   Diagnosis Date Noted  . h/o Borderline personality disorder [F60.3] 03/06/2017  . 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 PTSD (post-traumatic stress disorder) [F43.10] 04/04/2016  . Tobacco use disorder [F17.200] 04/03/2016  . Bipolar 2 disorder, major depressive episode (HCC) [F31.81] 04/02/2016  . Cocaine use disorder, moderate, dependence (HCC) [F14.20] 04/02/2016  . Cannabis use disorder, moderate, dependence (HCC) [F12.20] 04/02/2016     Psychiatric Specialty Exam: ROS  Blood pressure 98/64, pulse 60, temperature 97.7 F (36.5 C), temperature source Oral, resp. rate 18, height 5\' 8"  (1.727 m), weight 75.3 kg (166 lb), last menstrual period 08/17/2016, SpO2 100 %.Body mass index is 25.24 kg/m.                                                       Mental Status Per Nursing Assessment::   On Admission:  Suicidal ideation indicated by patient  Demographic Factors:  Divorced or widowed, Caucasian and Low socioeconomic status  Loss Factors: Loss of significant relationship, Legal issues and Financial problems/change in socioeconomic status  Historical Factors: Prior suicide attempts, Anniversary of important loss and Impulsivity  Risk Reduction Factors:   Sense of responsibility to family and Positive social support  Pt confirms no access to guns prior to discharge Denies SI or hopelessness Hopeful Bright affect   Continued Clinical Symptoms:  Alcohol/Substance Abuse/Dependencies Previous Psychiatric Diagnoses and Treatments  Cognitive Features That Contribute To Risk:  Polarized thinking    Suicide Risk:  Minimal: No identifiable suicidal ideation.  Patients presenting with no risk factors but with morbid ruminations; may be classified as minimal risk based on the severity  of the depressive symptoms    Jimmy FootmanHernandez-Gonzalez,  Reginae Wolfrey, MD 03/07/2017, 9:00 AM

## 2017-03-07 NOTE — B[facility] Group Notes (Signed)
[facility] LCSW Group Therapy  03/07/2017 10:51 AM  Type of Therapy:  Group Therapy  Participation Level:  Patient did not attend group. CSW invited patient to group.   Summary of Progress/Problems: Balance in life: Patients will discuss the concept of balance and how it looks and feels to be unbalanced. Pt will identify areas in their life that is unbalanced and ways to become more balanced. They discussed what aspects in their lives has influenced their self care. Patients also discussed self care in the areas of self regulation/control, hygiene/appearance, sleep/relaxation, healthy leisure, healthy eating habits, exercise, inner peace/spirituality, self improvement, sobriety, and health management. They were challenged to identify changes that are needed in order to improve self care.   Mercedes Forbes MSW, LCSWA 03/07/2017, 10:51 AM

## 2017-03-07 NOTE — Progress Notes (Addendum)
Mercedes Forbes  03/07/2017 8:51 AM Mercedes Forbes  MRN:  [id number] Subjective:  Mercedes Carlin Moseris a 46 y.o. caucasian femalewho presented to our ER voluntarily on 6/19 c/o auditory, visual hallucinations and SI.  The patient reported that for the past 1 week, she has had visual hallucinations and the people she is seeing are telling her to kill herself. The thoughts were ego syntonic.  She states that she has chronically been suicidal since the death of her husband by suicide in January 2015.  She denies any recent changes in her medications, they were last changed in April.   Patient has history of substance abuse, PTSD, personality disorder and possibly bipolar disorder. She has been hospitalized a multitude of times. She was last in our unit in April of this year.  Her urine toxicology screen is positive for benzodiazepines, cocaine and cannabis. Alcohol level was below the detection limit.  Current stressors: roommates have not been paying her rent. She still on probation (posseion of a fire arm), this will end in August.  Pt violated probation a few months ago due to having illicit drug in urine tox. Patient says that her wedding anniversary is next week which is also a stressor.  6/21 patient reports she is doing better today. She is no longer having suicidal ideation. She denies problems with sleep, appetite, energy or concentration. She complains of having problems with anxiety and is requesting medication to help with that. She was in her room she says she missed group this morning.  Patient is tolerating medications well. She denies any side effects. Denies HI or hallucinations. The patient still feels positive with plans for discharge tomorrow   Per nursing: D: Pt denies SI/HI/AVH. Pt is pleasant and cooperative. Pt appears less anxious ands he is interacting with peers and staff appropriately.  A: Pt was offered support and encouragement. Pt was given scheduled  medications. Pt was encouraged to attend groups. Q 15 minute checks were done for safety.  R:Pt attends groups and interacts well with peers and staff. Pt is taking medication. Pt has no complaints.Pt receptive to treatment and safety maintained on unit.  Principal Problem: Bipolar 2 disorder, major depressive episode ([place]) Diagnosis:   Patient Active Problem List   Diagnosis Date Noted  . h/o Borderline personality disorder [F60.3] 03/06/2017  . 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 PTSD (post-traumatic stress disorder) [F43.10] 04/04/2016  . Tobacco use disorder [F17.200] 04/03/2016  . Bipolar 2 disorder, major depressive episode ([place]) [F31.81] 04/02/2016  . Cocaine use disorder, moderate, dependence ([place]) [F14.20] 04/02/2016  . Cannabis use disorder, moderate, dependence ([place]) [F12.20] 04/02/2016   Total Time spent with patient: 30 minutes  Past Psychiatric History:There have been several psychiatric hospitalizations at [facility] and [facility].   She has diagnosis of bipolar disorder, major depression, PTSD, panic disorder. She's been tried on numerous medications but likes her current regimen the most. There were several suicide attempts by overdose.  Past Medical History:  Past Medical History:  Diagnosis Date  . Arthritis   . Bipolar 1 disorder ([place])   . Headache    migraines  . Personality disorder   . Polysubstance abuse   . Schizophrenia [facility])     Past Surgical History:  Procedure Laterality Date  . KNEE ARTHROSCOPY WITH PATELLA RECONSTRUCTION     left patella removed  . wrists Bilateral    Family History: History reviewed. No pertinent family history.   Family Psychiatric  History:  Patient reports that her mother had depression and her father was diagnosed with dementia.  Social History: She is disabled from mental illness. She has 3 children Ages 78, 4 and 52. She has a good relationship with them. The 48 year old is in college and is  doing very well, patient says he is in the Dean's list  Patient lives in a house with a couple and she rents a room to.  Patient has college education  As far as her legal history she is currently on probation for possession of a stolen firearm. She by a letter to probation in November as her urine toxicology was positive for illicit substances. She was in jail in February for 24 days. Patient tells me her probation officer wants her to do an additional 60 days. History  Alcohol Use No     History  Drug Use  . Types: Cocaine    Comment: last 1 month ago    Social History   Social History  . Marital status: Widowed    Spouse name: N/A  . Number of children: N/A  . Years of education: N/A   Social History Main Topics  . Smoking status: Current Every Day Smoker    Packs/day: 0.25  . Smokeless tobacco: Never Used     Comment: Nicotine patch was ordered  . Alcohol use No  . Drug use: Yes    Types: Cocaine     Comment: last 1 month ago  . Sexual activity: No   Other Topics Concern  . None   Social History Narrative  . None    Current Medications: Current Facility-Administered Medications  Medication Dose Route Frequency Provider Last Rate Last Dose  . acetaminophen (TYLENOL) tablet 650 mg  650 mg Oral Q6H PRN Clapacs, John T, MD      . alum & mag hydroxide-simeth (MAALOX/MYLANTA) 200-200-20 MG/5ML suspension 30 mL  30 mL Oral Q4H PRN Clapacs, John T, MD      . ARIPiprazole (ABILIFY) tablet 30 mg  30 mg Oral Daily Clapacs, Madie Reno, MD   30 mg at 03/06/17 0741  . feeding supplement (ENSURE ENLIVE) (ENSURE ENLIVE) liquid 237 mL  237 mL Oral BID BM Hildred Priest, MD   237 mL at 03/06/17 1429  . lithium carbonate (LITHOBID) CR tablet 600 mg  600 mg Oral QHS Clapacs, John T, MD   600 mg at 03/06/17 2154  . magnesium hydroxide (MILK OF MAGNESIA) suspension 30 mL  30 mL Oral Daily PRN Clapacs, John T, MD      . nicotine (NICODERM CQ - dosed in mg/24 hr) patch 7 mg   7 mg Transdermal Daily Hildred Priest, MD   7 mg at 03/06/17 1042  . prazosin (MINIPRESS) capsule 2 mg  2 mg Oral QHS Clapacs, Madie Reno, MD   2 mg at 03/06/17 2154  . traZODone (DESYREL) tablet 100 mg  100 mg Oral QHS Clapacs, Madie Reno, MD   100 mg at 03/06/17 2154  . venlafaxine XR (EFFEXOR-XR) 24 hr capsule 225 mg  225 mg Oral Q breakfast Clapacs, John T, MD   225 mg at 03/06/17 7680    Lab Results:  Results for orders placed or performed during the hospital encounter of 03/05/17 (from the past 48 hour(s))  Hemoglobin A1c     Status: None   Collection Time: 03/06/17  6:54 AM  Result Value Ref Range   Hgb A1c MFr Bld 5.1 4.8 - 5.6 %    Comment: ([facility])  Pre-diabetes: 5.7 - 6.4         Diabetes: >6.4         Glycemic control for adults with diabetes: <7.0    Mean Plasma Glucose 100 mg/dL    Comment: ([place]) Performed At: [facility] [place], [place] [id number] Lindon Romp MD [id number]   Lipid panel     Status: Abnormal   Collection Time: 03/06/17  6:54 AM  Result Value Ref Range   Cholesterol 214 (H) 0 - 200 mg/dL   Triglycerides 202 (H) <150 mg/dL   HDL 42 >40 mg/dL   Total CHOL/HDL Ratio 5.1 RATIO   VLDL 40 0 - 40 mg/dL   LDL Cholesterol 132 (H) 0 - 99 mg/dL    Comment:        Total Cholesterol/HDL:CHD Risk Coronary Heart Disease Risk Table                     Men   Women  1/2 Average Risk   3.4   3.3  Average Risk       5.0   4.4  2 X Average Risk   9.6   7.1  3 X Average Risk  23.4   11.0        Use the calculated Patient Ratio above and the CHD Risk Table to determine the patient's CHD Risk.        ATP III CLASSIFICATION (LDL):  <100     mg/dL   Optimal  100-129  mg/dL   Near or Above                    Optimal  130-159  mg/dL   Borderline  160-189  mg/dL   High  >190     mg/dL   Very High   Prolactin     Status: None   Collection Time: 03/06/17  6:54 AM  Result Value Ref Range   Prolactin 23.0 4.8 - 23.3 ng/mL     Comment: ([facility]) Performed At: [facility] [place], [place] [id number] Lindon Romp MD [id number]   TSH     Status: Abnormal   Collection Time: 03/06/17  6:54 AM  Result Value Ref Range   TSH 7.130 (H) 0.350 - 4.500 uIU/mL    Comment: Performed by a 3rd Generation assay with a functional sensitivity of <=0.01 uIU/mL.  Lithium level     Status: None   Collection Time: 03/07/17  6:49 AM  Result Value Ref Range   Lithium Lvl 0.79 0.60 - 1.20 mmol/L  Basic metabolic panel     Status: Abnormal   Collection Time: 03/07/17  6:49 AM  Result Value Ref Range   Sodium 137 135 - 145 mmol/L   Potassium 3.9 3.5 - 5.1 mmol/L   Chloride 105 101 - 111 mmol/L   CO2 28 22 - 32 mmol/L   Glucose, Bld 82 65 - 99 mg/dL   BUN 15 6 - 20 mg/dL   Creatinine, Ser 1.01 (H) 0.44 - 1.00 mg/dL   Calcium 9.1 8.9 - 10.3 mg/dL   GFR calc non Af Amer >60 >60 mL/min   GFR calc Af Amer >60 >60 mL/min    Comment: ([facility]) The eGFR has been calculated using the CKD EPI equation. This calculation has not been validated in all clinical situations. eGFR's persistently <60 mL/min signify possible Chronic Kidney Disease.    Anion gap 4 (L) 5 - 15  Blood Alcohol level:  Lab Results  Component Value Date   ETH <5 03/05/2017   ETH <5 83/38/2505    Metabolic Disorder Labs: Lab Results  Component Value Date   HGBA1C 5.1 03/06/2017   MPG 100 03/06/2017   MPG 97 12/29/2016   Lab Results  Component Value Date   PROLACTIN 23.0 03/06/2017   PROLACTIN 56.7 (H) 12/30/2016   Lab Results  Component Value Date   CHOL 214 (H) 03/06/2017   TRIG 202 (H) 03/06/2017   HDL 42 03/06/2017   CHOLHDL 5.1 03/06/2017   VLDL 40 03/06/2017   LDLCALC 132 (H) 03/06/2017   LDLCALC 124 (H) 12/29/2016    Physical Findings: AIMS: Facial and Oral Movements Muscles of Facial Expression: None, normal Lips and Perioral Area: None, normal Jaw: None, normal Tongue: None, normal,Extremity  Movements Upper (arms, wrists, hands, fingers): None, normal Lower (legs, knees, ankles, toes): None, normal, Trunk Movements Neck, shoulders, hips: None, normal, Overall Severity Severity of abnormal movements (highest score from questions above): None, normal Incapacitation due to abnormal movements: None, normal Patient's awareness of abnormal movements (rate only patient's report): No Awareness, Dental Status Current problems with teeth and/or dentures?: Yes Does patient usually wear dentures?: No  CIWA:    COWS:  COWS Total Score: 4  Musculoskeletal: Strength & Muscle Tone: within normal limits Gait & Station: normal Patient leans: N/A  Psychiatric Specialty Exam: Physical Exam  Constitutional: She is oriented to person, place, and time. She appears well-developed and well-nourished.  HENT:  Head: Normocephalic and atraumatic.  Eyes: Conjunctivae and EOM are normal.  Neck: Normal range of motion.  Respiratory: Effort normal.  Musculoskeletal: Normal range of motion.  Neurological: She is alert and oriented to person, place, and time.    Review of Systems  Constitutional: Negative.   HENT: Negative.   Eyes: Negative.   Respiratory: Negative.   Cardiovascular: Negative.   Gastrointestinal: Negative.   Genitourinary: Negative.   Musculoskeletal: Negative.   Skin: Negative.   Neurological: Negative.   Endo/Heme/Allergies: Negative.   Psychiatric/Behavioral: Positive for depression and substance abuse. Negative for hallucinations, memory loss and suicidal ideas. The patient is not nervous/anxious and does not have insomnia.     Blood pressure 98/64, pulse 60, temperature 97.7 F (36.5 C), temperature source Oral, resp. rate 18, height _0  (1.727 m), weight 75.3 kg (166 lb), last menstrual period 08/17/2016, SpO2 100 %.Body mass index is 25.24 kg/m.  General Appearance: Well Groomed  Eye Contact:  Good  Speech:  Clear and Coherent  Volume:  Normal  Mood:  Dysphoric   Affect:  Blunt  Thought Process:  Linear and Descriptions of Associations: Intact  Orientation:  Full (Time, Place, and Person)  Thought Content:  Hallucinations: None  Suicidal Thoughts:  No  Homicidal Thoughts:  No  Memory:  Immediate;   Good Recent;   Good Remote;   Good  Judgement:  Fair  Insight:  Shallow  Psychomotor Activity:  Decreased  Concentration:  Concentration: Good and Attention Span: Good  Recall:  Good  Fund of Knowledge:  Good  Language:  Good  Akathisia:  No  Handed:    AIMS (if indicated):     Assets:  Communication Skills Physical Health  ADL's:  Intact  Cognition:  WNL  Sleep:  Number of Hours: 6.5     Treatment Plan Summary:  Patient is a 46 year old female with a history of bipolar disorder, PTSD and substance abuse. She presents to our emergency department reporting anxiety  and hallucinations. Urine toxicology once again is positive for a multitude of illicit substances.  Bipolar disorder: continue abilify 30 mg po q day and Effexor 225 mg daily. Lithium will be decreased to 450 mg daily at bedtime as creatinine was elevated.  Lithium level on 6/21 was 0.79. Cr was 1.01.  Insomnia: Continue trazodone 100 mg by mouth daily at bedtime  Anxiety: continue effexor 225 mg by mouth daily to target depression and anxiety. I will start the patient on Vistaril 25 mg every 6 hours as needed  PTSD: Continue Effexor and Minipress 2 mg daily at bedtime. Patient also on trazodone 100 mg daily at bedtime  Cocaine, alcohol and marijuana use disorder: Patient is interested in going to intensive outpatient substance abuse at [facility] upon discharge. She was advised to abstain from marijuana and all illicit drugs as they may worsen mood symptoms. She denies any IV drug use.  Nicotine use disorder:will order nicotine patch of 7 mg  Elevated TSH will check again along with T3 and T4  Dispo: will return home   F/u [facility] for psychotropic medication management as  well as CST services.  Diet: regular  VS daily  Precautions q 15 m checks  Voluntary admission     Hildred Priest, MD 03/07/2017, 8:51 AM

## 2017-03-07 NOTE — Progress Notes (Signed)
.  D: Pt denies SI/HI/AVH. Pt is pleasant and cooperative. Pt appears less anxious and she is interacting with peers and staff appropriately.  A: Pt was offered support and encouragement. Pt was given scheduled medications. Pt was encouraged to attend groups. Q 15 minute checks were done for safety.  R:Pt attends groups and interacts well with peers and staff. Pt is taking medication. Pt has no complaints.Pt receptive to treatment and safety maintained on unit.   
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
## 2017-03-07 NOTE — [facility] Group Notes (Signed)
[facility] LCSW Group Therapy Note  Type of Therapy and Topic:  Group Therapy:  Goals Group: SMART Goals  Participation Level:  Patient did not attend group. CSW invited patient to group.   Description of Group:   The purpose of a daily goals group is to assist and guide patients in setting recovery/wellness-related goals.  The objective is to set goals as they relate to the crisis in which they were admitted. Patients will be using SMART goal modalities to set measurable goals.  Characteristics of realistic goals will be discussed and patients will be assisted in setting and processing how one will reach their goal. Facilitator will also assist patients in applying interventions and coping skills learned in psycho-education groups to the SMART goal and process how one will achieve defined goal.  Therapeutic Goals: -Patients will develop and document one goal related to or their crisis in which brought them into treatment. -Patients will be guided by LCSW using SMART goal setting modality in how to set a measurable, attainable, realistic and time sensitive goal.  -Patients will process barriers in reaching goal. -Patients will process interventions in how to overcome and successful in reaching goal.   Summary of Patient Progress:  Patient Goal: None identified at this time.    Therapeutic Modalities:   Motivational Interviewing Engineer, manufacturing systemsCognitive Behavioral Therapy Crisis Intervention Model SMART goals setting  Mouna Yager G. Garnette CzechSampson MSW, Outpatient Surgical Specialties CenterCSWA 03/07/2017 10:51 AM

## 2017-03-08 DIAGNOSIS — E039 Hypothyroidism, unspecified: Secondary | ICD-10-CM

## 2017-03-08 LAB — THYROID PANEL WITH TSH
FREE THYROXINE INDEX: 1 — AB (ref 1.2–4.9)
T3 Uptake Ratio: 21 % — ABNORMAL LOW (ref 24–39)
T4 TOTAL: 4.9 ug/dL (ref 4.5–12.0)
TSH: 7.4 u[IU]/mL — AB (ref 0.450–4.500)

## 2017-03-08 MED ORDER — HYDROXYZINE HCL 25 MG PO TABS: 25.0000 mg | ORAL_TABLET | Freq: Four times a day (QID) | ORAL | 0 refills | Status: DC | PRN

## 2017-03-08 MED ORDER — LEVOTHYROXINE SODIUM 25 MCG PO TABS: 25.0000 ug | ORAL_TABLET | Freq: Every day | ORAL | 0 refills | Status: DC

## 2017-03-08 MED ORDER — LEVOTHYROXINE SODIUM 25 MCG PO TABS: 25.0000 ug | ORAL_TABLET | Freq: Every day | ORAL | Status: DC

## 2017-03-08 NOTE — Progress Notes (Signed)
Denies SI/HI/AVH.  Affect blunted although verbalizes that she is ready to go home.  Discharge instructions given, verbalized understanding.  Prescriptions given and personal belongings returned.  Escorted off unit by this Clinical research associatewriter to main entrance to travel home with family.

## 2017-03-08 NOTE — Progress Notes (Signed)
D: Pt isolative to self on the unit.  Pt spoke upon approach.  She reports she feels ready for discharge tomorrow.  Pt reports her son will be with her during the anniversary of her husband's death.  She feels supported by family.  Brightens upon approach.  Denies s/i, h/i or hallucinations. A: Provided with support, coping skills reviewed. Monitored on 15 minute safety checks and maintained safety on the unit. R: Pt responds to support.  Monitor safety and cont tx plan.

## 2017-03-08 NOTE — [facility] Group Notes (Signed)
[facility] LCSW Group Therapy  03/08/2017 10:48 AM  Type of Therapy:  Group Therapy  Participation Level:  Active  Participation Quality:  Attentive and Sharing  Affect:  Appropriate  Cognitive:  Alert  Insight:  Improving  Engagement in Therapy:  Developing/Improving  Modes of Intervention:  Activity, Discussion, Education, Problem-solving, Reality Testing, Socialization and Support  Summary of Progress/Problems: Feelings around Relapse. Group members discussed the meaning of relapse and shared personal stories of relapse, how it affected them and others, and how they perceived themselves during this time. Group members were encouraged to identify triggers, warning signs and coping skills used when facing the possibility of relapse. Social supports were discussed and explored in detail. Patients also discussed facing disappointment and how that can trigger someone to relapse.  Mercedes Forbes G. Garnette CzechSampson MSW, LCSWA 03/08/2017, 10:48 AM

## 2017-03-08 NOTE — [facility] Suicide Risk Assessment (Signed)
[facility] INPATIENT:  Family/Significant Other Suicide Prevention Education  Suicide Prevention Education:  Patient Refusal for Family/Significant Other Suicide Prevention Education: The patient Zola ButtonKristen R Soth has refused to provide written consent for family/significant other to be provided Family/Significant Other Suicide Prevention Education during admission and/or prior to discharge.  Physician notified.  Lynden OxfordKadijah R Rome Schlauch, MSW, LCSW-A 03/08/2017, 10:34 AM

## 2017-03-08 NOTE — Progress Notes (Signed)
  [facility] :  Will you be returning to the same living situation after discharge:  Yes,  returning home At discharge, do you have transportation home?: Yes,  friend. Do you have the ability to pay for your medications: Yes,  Medicaid  Release of information consent forms completed and in the chart;  Patient's signature needed at discharge.  Patient to Follow up at: Follow-up Information    [facility] Go on 03/13/2017.   Why:  Unk PintoHarvey Bryant will pick you up at [facility] for [facility]. If you have questions or concerns, contact Unk PintoHarvey Bryant directly. Contact information: [address] [place] [phone]           Next level of care provider has access to [facility] Link:no  Safety Planning and Suicide Prevention discussed: Yes,  SPE discussed with family.  Have you used any form of tobacco in the last 30 days? (Cigarettes, Smokeless Tobacco, Cigars, and/or Pipes): Yes  Has patient been referred to the Quitline?: Patient refused referral  Patient has been referred for addiction treatment: Yes  Lynden OxfordKadijah R Takesha Steger, MSW, LCSW-A 03/08/2017, 10:33 AM

## 2017-03-08 NOTE — Progress Notes (Signed)
Pt appeared to sleep about 8 hours while monitored on 15 minute safety checks.   
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
## 2017-04-15 ENCOUNTER — Emergency Department
Admission: EM | Admit: 2017-04-15 | Discharge: 2017-04-15 | Disposition: A | Discharge status: Home / Self Care | Payer: Medicare Other | Attending: Emergency Medicine | Admitting: Emergency Medicine

## 2017-04-15 ENCOUNTER — Encounter: Payer: Self-pay | Admitting: Emergency Medicine

## 2017-04-15 DIAGNOSIS — F3181 Bipolar II disorder: Secondary | ICD-10-CM | POA: Diagnosis present

## 2017-04-15 DIAGNOSIS — Z96652 Presence of left artificial knee joint: Secondary | ICD-10-CM | POA: Insufficient documentation

## 2017-04-15 DIAGNOSIS — R45851 Suicidal ideations: Secondary | ICD-10-CM | POA: Diagnosis not present

## 2017-04-15 DIAGNOSIS — F1994 Other psychoactive substance use, unspecified with psychoactive substance-induced mood disorder: Secondary | ICD-10-CM

## 2017-04-15 DIAGNOSIS — E039 Hypothyroidism, unspecified: Secondary | ICD-10-CM | POA: Diagnosis not present

## 2017-04-15 DIAGNOSIS — F4312 Post-traumatic stress disorder, chronic: Secondary | ICD-10-CM | POA: Diagnosis not present

## 2017-04-15 DIAGNOSIS — Z79899 Other long term (current) drug therapy: Secondary | ICD-10-CM | POA: Insufficient documentation

## 2017-04-15 DIAGNOSIS — F319 Bipolar disorder, unspecified: Secondary | ICD-10-CM | POA: Diagnosis not present

## 2017-04-15 DIAGNOSIS — F1210 Cannabis abuse, uncomplicated: Secondary | ICD-10-CM | POA: Insufficient documentation

## 2017-04-15 DIAGNOSIS — F17210 Nicotine dependence, cigarettes, uncomplicated: Secondary | ICD-10-CM | POA: Insufficient documentation

## 2017-04-15 DIAGNOSIS — F1510 Other stimulant abuse, uncomplicated: Secondary | ICD-10-CM

## 2017-04-15 DIAGNOSIS — F4310 Post-traumatic stress disorder, unspecified: Secondary | ICD-10-CM | POA: Diagnosis present

## 2017-04-15 DIAGNOSIS — F1410 Cocaine abuse, uncomplicated: Secondary | ICD-10-CM | POA: Diagnosis not present

## 2017-04-15 DIAGNOSIS — R443 Hallucinations, unspecified: Secondary | ICD-10-CM | POA: Diagnosis present

## 2017-04-15 DIAGNOSIS — F1420 Cocaine dependence, uncomplicated: Secondary | ICD-10-CM | POA: Diagnosis present

## 2017-04-15 LAB — COMPREHENSIVE METABOLIC PANEL
ALK PHOS: 71 U/L (ref 38–126)
ALT: 10 U/L — ABNORMAL LOW (ref 14–54)
ANION GAP: 6 (ref 5–15)
AST: 18 U/L (ref 15–41)
Albumin: 4.4 g/dL (ref 3.5–5.0)
BUN: 8 mg/dL (ref 6–20)
CALCIUM: 9.4 mg/dL (ref 8.9–10.3)
CO2: 27 mmol/L (ref 22–32)
Chloride: 108 mmol/L (ref 101–111)
Creatinine, Ser: 1.02 mg/dL — ABNORMAL HIGH (ref 0.44–1.00)
GFR calc non Af Amer: >60 mL/min (ref >60)
Glucose, Bld: 95 mg/dL (ref 65–99)
POTASSIUM: 3.9 mmol/L (ref 3.5–5.1)
SODIUM: 141 mmol/L (ref 135–145)
TOTAL PROTEIN: 7.7 g/dL (ref 6.5–8.1)
Total Bilirubin: 0.6 mg/dL (ref 0.3–1.2)

## 2017-04-15 LAB — URINE DRUG SCREEN, QUALITATIVE (ARMC ONLY)
Amphetamines, Ur Screen: POSITIVE — AB
BENZODIAZEPINE, UR SCRN: POSITIVE — AB
Barbiturates, Ur Screen: NOT DETECTED
CANNABINOID 50 NG, UR ~~LOC~~: NOT DETECTED
Cocaine Metabolite,Ur ~~LOC~~: POSITIVE — AB
MDMA (Ecstasy)Ur Screen: NOT DETECTED
METHADONE SCREEN, URINE: NOT DETECTED
Opiate, Ur Screen: NOT DETECTED
Phencyclidine (PCP) Ur S: NOT DETECTED
TRICYCLIC, UR SCREEN: NOT DETECTED

## 2017-04-15 LAB — CBC
HEMATOCRIT: 37.4 % (ref 35.0–47.0)
HEMOGLOBIN: 12.6 g/dL (ref 12.0–16.0)
MCH: 31 pg (ref 26.0–34.0)
MCHC: 33.8 g/dL (ref 32.0–36.0)
MCV: 91.7 fL (ref 80.0–100.0)
Platelets: 247 10*3/uL (ref 150–440)
RBC: 4.08 MIL/uL (ref 3.80–5.20)
RDW: 14.8 % — AB (ref 11.5–14.5)
WBC: 7.7 10*3/uL (ref 3.6–11.0)

## 2017-04-15 LAB — ETHANOL: Alcohol, Ethyl (B): <5 mg/dL (ref <5)

## 2017-04-15 LAB — ACETAMINOPHEN LEVEL: Acetaminophen (Tylenol), Serum: <10 ug/mL — ABNORMAL LOW (ref 10–30)

## 2017-04-15 LAB — SALICYLATE LEVEL: Salicylate Lvl: <7 mg/dL (ref 2.8–30.0)

## 2017-04-15 MED ORDER — ZOLPIDEM TARTRATE 10 MG PO TABS: 10.0000 mg | ORAL_TABLET | Freq: Every evening | ORAL | 0 refills | Status: DC | PRN

## 2017-04-15 NOTE — ED Notes (Signed)
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
## 2017-04-15 NOTE — Discharge Instructions (Signed)
You have been seen in the emergency department for a  psychiatric concern. You have been evaluated both medically as well as psychiatrically. Please follow-up with your outpatient resources provided. Return to the emergency department for any worsening symptoms, or any thoughts of hurting yourself or anyone else so that we may attempt to help you. 
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
## 2017-04-15 NOTE — ED Notes (Signed)
Pt given supper tray.

## 2017-04-15 NOTE — ED Provider Notes (Signed)
-----------------------------------------   3:26 PM on 04/15/2017 -----------------------------------------  Patient has been seen by psychiatry. They believe the patient is safe for discharge home. Patient's medical workup is been largely nonrevealing besides a urine toxicology positive for cocaine, amphetamines, benzodiazepines. We will discharge home with outpatient follow-up.   Minna AntisPaduchowski, Laiklynn Raczynski, MD 04/15/17 [phone]
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
## 2017-04-15 NOTE — ED Notes (Signed)
Pt given lunch tray.

## 2017-04-15 NOTE — ED Triage Notes (Signed)
Pt reports has had her medications for the past two weeks but did not have them the two weeks prior because she was in jail. Pt reports she has been having hallucinations of people that are not there. Pt reports that she is having crazy thoughts which make her feel like she wants to die but does not have a plan to kill herself and she has not taken any actions to hurt herself. Pt states she just needs help. Pt presents voluntarily.
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
## 2017-04-15 NOTE — ED Notes (Signed)
Resumed car from Terril Amaro t rn. Pt in hallway bed talking with dr clapacs.

## 2017-04-15 NOTE — ED Notes (Signed)
BEHAVIORAL HEALTH ROUNDING Patient sleeping: No. Patient alert and oriented: yes Behavior appropriate: Yes.  ; If no, describe:  Nutrition and fluids offered: yes Toileting and hygiene offered: Yes  Sitter present: q15 minute observations and security  monitoring Law enforcement present: Yes  ODS  

## 2017-04-17 ENCOUNTER — Emergency Department
Admission: EM | Admit: 2017-04-17 | Discharge: 2017-04-17 | Disposition: A | Discharge status: Other Acute Inpatient Hospital | Payer: Medicare Other | Attending: Emergency Medicine | Admitting: Emergency Medicine

## 2017-04-17 ENCOUNTER — Inpatient Hospital Stay
Admission: AD | Admit: 2017-04-17 | Discharge: 2017-04-19 | DRG: 885 | Disposition: A | Discharge status: Home / Self Care | Payer: Medicare Other | Source: Intra-hospital | Attending: Psychiatry | Admitting: Psychiatry

## 2017-04-17 DIAGNOSIS — Z62810 Personal history of physical and sexual abuse in childhood: Secondary | ICD-10-CM | POA: Diagnosis present

## 2017-04-17 DIAGNOSIS — F1510 Other stimulant abuse, uncomplicated: Secondary | ICD-10-CM | POA: Diagnosis present

## 2017-04-17 DIAGNOSIS — F209 Schizophrenia, unspecified: Secondary | ICD-10-CM | POA: Diagnosis present

## 2017-04-17 DIAGNOSIS — R45851 Suicidal ideations: Secondary | ICD-10-CM | POA: Diagnosis present

## 2017-04-17 DIAGNOSIS — F4310 Post-traumatic stress disorder, unspecified: Secondary | ICD-10-CM | POA: Diagnosis present

## 2017-04-17 DIAGNOSIS — Z79899 Other long term (current) drug therapy: Secondary | ICD-10-CM | POA: Diagnosis not present

## 2017-04-17 DIAGNOSIS — G43909 Migraine, unspecified, not intractable, without status migrainosus: Secondary | ICD-10-CM | POA: Diagnosis present

## 2017-04-17 DIAGNOSIS — F1420 Cocaine dependence, uncomplicated: Secondary | ICD-10-CM | POA: Diagnosis present

## 2017-04-17 DIAGNOSIS — F17200 Nicotine dependence, unspecified, uncomplicated: Secondary | ICD-10-CM | POA: Insufficient documentation

## 2017-04-17 DIAGNOSIS — M1990 Unspecified osteoarthritis, unspecified site: Secondary | ICD-10-CM | POA: Diagnosis present

## 2017-04-17 DIAGNOSIS — F1920 Other psychoactive substance dependence, uncomplicated: Secondary | ICD-10-CM | POA: Diagnosis not present

## 2017-04-17 DIAGNOSIS — F609 Personality disorder, unspecified: Secondary | ICD-10-CM | POA: Diagnosis present

## 2017-04-17 DIAGNOSIS — F319 Bipolar disorder, unspecified: Secondary | ICD-10-CM | POA: Insufficient documentation

## 2017-04-17 DIAGNOSIS — E039 Hypothyroidism, unspecified: Secondary | ICD-10-CM | POA: Diagnosis present

## 2017-04-17 DIAGNOSIS — F1220 Cannabis dependence, uncomplicated: Secondary | ICD-10-CM | POA: Diagnosis present

## 2017-04-17 DIAGNOSIS — F1910 Other psychoactive substance abuse, uncomplicated: Secondary | ICD-10-CM

## 2017-04-17 DIAGNOSIS — F3181 Bipolar II disorder: Secondary | ICD-10-CM | POA: Diagnosis present

## 2017-04-17 DIAGNOSIS — Z818 Family history of other mental and behavioral disorders: Secondary | ICD-10-CM | POA: Diagnosis not present

## 2017-04-17 DIAGNOSIS — G4700 Insomnia, unspecified: Secondary | ICD-10-CM | POA: Diagnosis present

## 2017-04-17 DIAGNOSIS — F1110 Opioid abuse, uncomplicated: Secondary | ICD-10-CM | POA: Diagnosis present

## 2017-04-17 LAB — URINALYSIS, COMPLETE (UACMP) WITH MICROSCOPIC
Bilirubin Urine: NEGATIVE
GLUCOSE, UA: NEGATIVE mg/dL
HGB URINE DIPSTICK: NEGATIVE
Ketones, ur: NEGATIVE mg/dL
Leukocytes, UA: NEGATIVE
NITRITE: POSITIVE — AB
Protein, ur: NEGATIVE mg/dL
RBC / HPF: NONE SEEN RBC/hpf (ref 0–5)
SPECIFIC GRAVITY, URINE: 1.01 (ref 1.005–1.030)
pH: 5 (ref 5.0–8.0)

## 2017-04-17 LAB — COMPREHENSIVE METABOLIC PANEL
ALBUMIN: 4.2 g/dL (ref 3.5–5.0)
ALK PHOS: 73 U/L (ref 38–126)
ALT: 10 U/L — AB (ref 14–54)
ANION GAP: 5 (ref 5–15)
AST: 18 U/L (ref 15–41)
BILIRUBIN TOTAL: 0.5 mg/dL (ref 0.3–1.2)
BUN: 8 mg/dL (ref 6–20)
CO2: 26 mmol/L (ref 22–32)
Calcium: 8.9 mg/dL (ref 8.9–10.3)
Chloride: 108 mmol/L (ref 101–111)
Creatinine, Ser: 0.96 mg/dL (ref 0.44–1.00)
GFR calc Af Amer: >60 mL/min (ref >60)
GFR calc non Af Amer: >60 mL/min (ref >60)
Glucose, Bld: 87 mg/dL (ref 65–99)
Potassium: 3.7 mmol/L (ref 3.5–5.1)
Sodium: 139 mmol/L (ref 135–145)
TOTAL PROTEIN: 7.5 g/dL (ref 6.5–8.1)

## 2017-04-17 LAB — CBC
HEMATOCRIT: 35.6 % (ref 35.0–47.0)
HEMOGLOBIN: 12.1 g/dL (ref 12.0–16.0)
MCH: 31.1 pg (ref 26.0–34.0)
MCHC: 34 g/dL (ref 32.0–36.0)
MCV: 91.2 fL (ref 80.0–100.0)
Platelets: 246 10*3/uL (ref 150–440)
RBC: 3.9 MIL/uL (ref 3.80–5.20)
RDW: 15.2 % — ABNORMAL HIGH (ref 11.5–14.5)
WBC: 7.3 10*3/uL (ref 3.6–11.0)

## 2017-04-17 LAB — URINE DRUG SCREEN, QUALITATIVE (ARMC ONLY)
Amphetamines, Ur Screen: POSITIVE — AB
Barbiturates, Ur Screen: NOT DETECTED
Benzodiazepine, Ur Scrn: POSITIVE — AB
CANNABINOID 50 NG, UR ~~LOC~~: POSITIVE — AB
COCAINE METABOLITE, UR ~~LOC~~: POSITIVE — AB
MDMA (ECSTASY) UR SCREEN: NOT DETECTED
Methadone Scn, Ur: NOT DETECTED
Opiate, Ur Screen: NOT DETECTED
Phencyclidine (PCP) Ur S: NOT DETECTED
TRICYCLIC, UR SCREEN: NOT DETECTED

## 2017-04-17 LAB — PREGNANCY, URINE: Preg Test, Ur: NEGATIVE

## 2017-04-17 LAB — ETHANOL

## 2017-04-17 MED ORDER — PRAZOSIN HCL 2 MG PO CAPS: 2.0000 mg | ORAL_CAPSULE | Freq: Every day | ORAL | Status: DC

## 2017-04-17 MED ORDER — VENLAFAXINE HCL ER 75 MG PO CP24: 225.0000 mg | ORAL_CAPSULE | Freq: Every day | ORAL | Status: DC

## 2017-04-17 MED ORDER — ARIPIPRAZOLE 10 MG PO TABS
20.0000 mg | ORAL_TABLET | Freq: Every day | ORAL | Status: DC
  Administered 2017-04-17: 20 mg via ORAL
  Filled 2017-04-17: qty 2

## 2017-04-17 MED ORDER — LEVOTHYROXINE SODIUM 25 MCG PO TABS: 25.0000 ug | ORAL_TABLET | Freq: Every day | ORAL | Status: DC

## 2017-04-17 MED ORDER — LITHIUM CARBONATE ER 300 MG PO TBCR
300.0000 mg | EXTENDED_RELEASE_TABLET | Freq: Two times a day (BID) | ORAL | Status: DC
  Administered 2017-04-17 (×2): 300 mg via ORAL
  Filled 2017-04-17 (×2): qty 1

## 2017-04-17 MED ORDER — TRAZODONE HCL 100 MG PO TABS
100.0000 mg | ORAL_TABLET | Freq: Every day | ORAL | Status: DC
  Administered 2017-04-17: 100 mg via ORAL
  Filled 2017-04-17: qty 1

## 2017-04-17 NOTE — ED Notes (Signed)
Pt. Alert and oriented, warm and dry, in no distress. Pt. Denies SI, HI, and AVH. Pt made aware of possible transfer to BMU. Patient agreeable with transfer. Pt. Encouraged to let nursing staff know of any concerns or needs.

## 2017-04-17 NOTE — ED Provider Notes (Signed)
[facility] Emergency Department Provider Note   First MD Initiated Contact with Patient 04/17/17 0136     (approximate)  I have reviewed the triage vital signs and the nursing notes.   HISTORY  Chief Complaint Suicidal    HPI Mercedes Forbes is a 46 y.o. female with below list of chronic medical conditions and psychiatric conditions presents to the emergency department was suicidal ideation and inability to sleep. Patient denies any illicit drug use today or yesterday stating that her last drug use was on Saturday which was cocaine. Patient admits to previous suicide attempt via "cutting"   Past Medical History:  Diagnosis Date  . Arthritis   . Bipolar 1 disorder ([facility])   . Headache    migraines  . Personality disorder   . Polysubstance abuse   . Schizophrenia [facility])     Patient Active Problem List   Diagnosis Date Noted  . Amphetamine abuse 04/15/2017  . Substance induced mood disorder ([facility]) 04/15/2017  . Hypothyroidism 03/08/2017  . h/o Borderline personality disorder 03/06/2017  . Opiate abuse, episodic 07/22/2016  . PTSD (post-traumatic stress disorder) 04/04/2016  . Tobacco use disorder 04/03/2016  . Bipolar 2 disorder, major depressive episode ([facility]) 04/02/2016  . Cocaine use disorder, moderate, dependence ([facility]) 04/02/2016  . Cannabis use disorder, moderate, dependence ([facility]) 04/02/2016    Past Surgical History:  Procedure Laterality Date  . KNEE ARTHROSCOPY WITH PATELLA RECONSTRUCTION     left patella removed  . wrists Bilateral     Prior to Admission medications   Medication Sig Start Date End Date Taking? Authorizing Provider  ARIPiprazole (ABILIFY) 20 MG tablet Take 1 tablet (20 mg total) by mouth daily. Patient taking differently: Take 20 mg by mouth at bedtime.  07/26/16   Jimmy FootmanHernandez-Gonzalez, Andrea, MD  chlordiazePOXIDE (LIBRIUM) 25 MG capsule Take 25 mg by mouth daily. 04/02/17   [provider]  hydrOXYzine  (ATARAX/VISTARIL) 25 MG tablet Take 1 tablet (25 mg total) by mouth every 6 (six) hours as needed for anxiety. Patient not taking: Reported on 04/15/2017 03/08/17   Jimmy FootmanHernandez-Gonzalez, Andrea, MD  levothyroxine (SYNTHROID, LEVOTHROID) 25 MCG tablet Take 1 tablet (25 mcg total) by mouth daily before breakfast. Patient not taking: Reported on 04/15/2017 03/09/17   Jimmy FootmanHernandez-Gonzalez, Andrea, MD  lithium carbonate (ESKALITH) 450 MG CR tablet Take 1 tablet (450 mg total) by mouth at bedtime. Patient not taking: Reported on 04/15/2017 03/07/17   Jimmy FootmanHernandez-Gonzalez, Andrea, MD  lithium carbonate (LITHOBID) 300 MG CR tablet Take 600 mg by mouth daily. 04/02/17   [provider]  prazosin (MINIPRESS) 2 MG capsule Take 1 capsule (2 mg total) by mouth at bedtime. Patient taking differently: Take 2 mg by mouth 2 (two) times daily.  07/25/16   Jimmy FootmanHernandez-Gonzalez, Andrea, MD  traZODone (DESYREL) 100 MG tablet Take 1 tablet (100 mg total) by mouth at bedtime. 01/01/17   Jimmy FootmanHernandez-Gonzalez, Andrea, MD  venlafaxine XR (EFFEXOR-XR) 75 MG 24 hr capsule Take 3 capsules (225 mg total) by mouth daily with breakfast. 01/02/17   Jimmy FootmanHernandez-Gonzalez, Andrea, MD  zolpidem (AMBIEN) 10 MG tablet Take 1 tablet (10 mg total) by mouth at bedtime as needed for sleep. 04/15/17 05/15/17  Clapacs, Jackquline DenmarkJohn T, MD    Allergies Aspirin; Flurbiprofen; and Nsaids  No family history on file.  Social History Social History  Substance Use Topics  . Smoking status: Current Every Day Smoker    Packs/day: 0.25  . Smokeless tobacco: Never Used     Comment: Nicotine  patch was ordered  . Alcohol use No    Review of Systems Constitutional: No fever/chills Eyes: No visual changes. ENT: No sore throat. Cardiovascular: Denies chest pain. Respiratory: Denies shortness of breath. Gastrointestinal: No abdominal pain.  No nausea, no vomiting.  No diarrhea.  No constipation. Genitourinary: Negative for dysuria. Musculoskeletal: Negative for  neck pain.  Negative for back pain. Integumentary: Negative for rash. Neurological: Negative for headaches, focal weakness or numbness. Psychiatric:Positive for suicidal ideation   ____________________________________________   PHYSICAL EXAM:  VITAL SIGNS: ED Triage Vitals  Enc Vitals Group     BP 04/17/17 0116 (!) 146/88     Pulse Rate 04/17/17 0116 75     Resp 04/17/17 0116 20     Temp 04/17/17 0116 98.3 F (36.8 C)     Temp Source 04/17/17 0116 Oral     SpO2 04/17/17 0116 96 %     Weight 04/17/17 0116 75.3 kg (166 lb)     Height 04/17/17 0116 1.727 m (5\' 8" )     Head Circumference --      Peak Flow --      Pain Score 04/17/17 0115 7     Pain Loc --    Constitutional:Asleep on my arrival to the room but awoken by verbal stimuli. Eyes: Conjunctivae are normal. Dilated bilateral pupils equal Head: Atraumatic. Mouth/Throat: Mucous membranes are moist. Oropharynx non-erythematous. Neck: No stridor.  Cardiovascular: Normal rate, regular rhythm. Good peripheral circulation. Grossly normal heart sounds. Respiratory: Normal respiratory effort.  No retractions. Lungs CTAB. Gastrointestinal: Soft and nontender. No distention.   Musculoskeletal: No lower extremity tenderness nor edema. No gross deformities of extremities. Neurologic:  Normal speech and language. No gross focal neurologic deficits are appreciated.  Skin:  Skin is warm, dry and intact. No rash noted. Psychiatric: Appears to be under the influence of an illicit drug, suicidal ideation  ____________________________________________   LABS (all labs ordered are listed, but only abnormal results are displayed)  Labs Reviewed  CBC - Abnormal; Notable for the following:       Result Value   RDW 15.2 (*)    All other components within normal limits  COMPREHENSIVE METABOLIC PANEL - Abnormal; Notable for the following:    ALT 10 (*)    All other components within normal limits  URINALYSIS, COMPLETE (UACMP) WITH  MICROSCOPIC - Abnormal; Notable for the following:    Color, Urine YELLOW (*)    APPearance HAZY (*)    Nitrite POSITIVE (*)    Bacteria, UA RARE (*)    Squamous Epithelial / LPF 0-5 (*)    All other components within normal limits  URINE DRUG SCREEN, QUALITATIVE ([facility] ONLY) - Abnormal; Notable for the following:    Amphetamines, Ur Screen POSITIVE (*)    Cocaine Metabolite,Ur [place] POSITIVE (*)    Cannabinoid 50 Ng, Ur [place] POSITIVE (*)    Benzodiazepine, Ur Scrn POSITIVE (*)    All other components within normal limits  ETHANOL  POC URINE PREG, ED   ____________________________________________  Procedures   ____________________________________________   INITIAL IMPRESSION / ASSESSMENT AND PLAN / ED COURSE  Pertinent labs & imaging results that were available during my care of the patient were reviewed by me and considered in my medical decision making (see chart for details).  Await psychiatry evaluation      ____________________________________________  FINAL CLINICAL IMPRESSION(S) / ED DIAGNOSES  Final diagnoses:  Suicidal ideation  Polysubstance abuse     MEDICATIONS GIVEN DURING THIS VISIT:  Medications - No data to display   NEW OUTPATIENT MEDICATIONS STARTED DURING THIS VISIT:  New Prescriptions   No medications on file    Modified Medications   No medications on file    Discontinued Medications   No medications on file     Note:  This document was prepared using Dragon voice recognition software and may include unintentional dictation errors.    Darci Current, MD 04/17/17 [id number]

## 2017-04-17 NOTE — ED Notes (Signed)
VOL/Will be admitted after 7:30 pm

## 2017-04-17 NOTE — ED Notes (Signed)
Report called to Amy Rn in BMU.

## 2017-04-17 NOTE — Consult Note (Signed)
[facility] Face-to-Face Psychiatry Consult   Reason for Consult:  Consult for 46 year old woman with a history of mood instability and substance abuse who comes back to the emergency room reporting suicidal ideation Referring Physician:  Rifenbark Patient Identification: Mercedes Forbes MRN:  [id number] Principal Diagnosis: Bipolar 2 disorder, major depressive episode [facility]) Diagnosis:   Patient Active Problem List   Diagnosis Date Noted  . Amphetamine abuse [F15.10] 04/15/2017  . Substance induced mood disorder ([place]) [F19.94] 04/15/2017  . Hypothyroidism [E03.9] 03/08/2017  . h/o Borderline personality disorder [F60.3] 03/06/2017  . 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 PTSD (post-traumatic stress disorder) [F43.10] 04/04/2016  . Tobacco use disorder [F17.200] 04/03/2016  . Bipolar 2 disorder, major depressive episode ([place]) [F31.81] 04/02/2016  . Cocaine use disorder, moderate, dependence ([place]) [F14.20] 04/02/2016  . Cannabis use disorder, moderate, dependence ([place]) [F12.20] 04/02/2016    Total Time spent with patient: 1 hour  Subjective:   Mercedes Forbes is a 46 y.o. female patient admitted with "I keep having these thoughts of hurting myself".  HPI:  Patient interviewed chart reviewed. Patient well known from prior encounters including having been here in the emergency room just a couple days ago. This is a 46 year old woman with a history of bipolar disorder type II PTSD and substance abuse. She comes in today saying that she continues to have suicidal thoughts. She says that she has not been able to sleep the last 2 days even with the Ambien that I gave her. Mood remains depressed and hopeless. Energy level low. Intrusive thoughts about killing herself. Has not actually acted on it. Patient claims she has been compliant with her medicine. She also claims that she has not used any more drugs since I saw her last. I am a little suspicious of that claim since her drug screen is still  positive for amphetamines and cocaine today. Major stresses include difficulty getting along with people she lives with.  Social history: Lives in her own place but has people who stay with her. From what she describes there is a lot of drama going on all around her.  Medical history: Hypothyroid. History of orthopedic problems  Substance abuse history: History of abuse of multiple drugs including cannabis and amphetamines and cocaine  Past Psychiatric History: Several prior admissions multiple encounters with the mental health system. Tendency to not follow through as well as she probably should. Has threatened suicide many times in the past I don't think she has made a serious attempt. Has been prescribed multiple medicines. Unclear if anything has been of much help for very long. Recent hospitalization just over a month ago  Risk to Self: Is patient at risk for suicide?: Yes Risk to Others:   Prior Inpatient Therapy:   Prior Outpatient Therapy:    Past Medical History:  Past Medical History:  Diagnosis Date  . Arthritis   . Bipolar 1 disorder ([place])   . Headache    migraines  . Personality disorder   . Polysubstance abuse   . Schizophrenia [facility])     Past Surgical History:  Procedure Laterality Date  . KNEE ARTHROSCOPY WITH PATELLA RECONSTRUCTION     left patella removed  . wrists Bilateral    Family History: No family history on file. Family Psychiatric  History: Positive for mood disorder and substance abuse Social History:  History  Alcohol Use No     History  Drug Use  . Types: Cocaine    Comment: last 1 month ago  Social History   Social History  . Marital status: Widowed    Spouse name: N/A  . Number of children: N/A  . Years of education: N/A   Social History Main Topics  . Smoking status: Current Every Day Smoker    Packs/day: 0.25  . Smokeless tobacco: Never Used     Comment: Nicotine patch was ordered  . Alcohol use No  . Drug use: Yes    Types:  Cocaine     Comment: last 1 month ago  . Sexual activity: No   Other Topics Concern  . Not on file   Social History Narrative  . No narrative on file   Additional Social History:    Allergies:   Allergies  Allergen Reactions  . Aspirin Other (See Comments) and Shortness Of Breath    Shortness of breath like an asthma attack  . Flurbiprofen Shortness Of Breath  . Nsaids Shortness Of Breath    Labs:  Results for orders placed or performed during the hospital encounter of 04/17/17 (from the past 48 hour(s))  CBC     Status: Abnormal   Collection Time: 04/17/17  1:20 AM  Result Value Ref Range   WBC 7.3 3.6 - 11.0 K/uL   RBC 3.90 3.80 - 5.20 MIL/uL   Hemoglobin 12.1 12.0 - 16.0 g/dL   HCT 35.6 35.0 - 47.0 %   MCV 91.2 80.0 - 100.0 fL   MCH 31.1 26.0 - 34.0 pg   MCHC 34.0 32.0 - 36.0 g/dL   RDW 15.2 (H) 11.5 - 14.5 %   Platelets 246 150 - 440 K/uL  Comprehensive metabolic panel     Status: Abnormal   Collection Time: 04/17/17  1:20 AM  Result Value Ref Range   Sodium 139 135 - 145 mmol/L   Potassium 3.7 3.5 - 5.1 mmol/L   Chloride 108 101 - 111 mmol/L   CO2 26 22 - 32 mmol/L   Glucose, Bld 87 65 - 99 mg/dL   BUN 8 6 - 20 mg/dL   Creatinine, Ser 0.96 0.44 - 1.00 mg/dL   Calcium 8.9 8.9 - 10.3 mg/dL   Total Protein 7.5 6.5 - 8.1 g/dL   Albumin 4.2 3.5 - 5.0 g/dL   AST 18 15 - 41 U/L   ALT 10 (L) 14 - 54 U/L   Alkaline Phosphatase 73 38 - 126 U/L   Total Bilirubin 0.5 0.3 - 1.2 mg/dL   GFR calc non Af Amer >60 >60 mL/min   GFR calc Af Amer >60 >60 mL/min    Comment: (NOTE) The eGFR has been calculated using the CKD EPI equation. This calculation has not been validated in all clinical situations. eGFR's persistently <60 mL/min signify possible Chronic Kidney Disease.    Anion gap 5 5 - 15  Ethanol     Status: None   Collection Time: 04/17/17  1:20 AM  Result Value Ref Range   Alcohol, Ethyl (B) <5 <5 mg/dL    Comment:        LOWEST DETECTABLE LIMIT FOR SERUM  ALCOHOL IS 5 mg/dL FOR MEDICAL PURPOSES ONLY   Urinalysis, Complete w Microscopic     Status: Abnormal   Collection Time: 04/17/17  1:20 AM  Result Value Ref Range   Color, Urine YELLOW (A) YELLOW   APPearance HAZY (A) CLEAR   Specific Gravity, Urine 1.010 1.005 - 1.030   pH 5.0 5.0 - 8.0   Glucose, UA NEGATIVE NEGATIVE mg/dL   Hgb urine dipstick  NEGATIVE NEGATIVE   Bilirubin Urine NEGATIVE NEGATIVE   Ketones, ur NEGATIVE NEGATIVE mg/dL   Protein, ur NEGATIVE NEGATIVE mg/dL   Nitrite POSITIVE (A) NEGATIVE   Leukocytes, UA NEGATIVE NEGATIVE   RBC / HPF NONE SEEN 0 - 5 RBC/hpf   WBC, UA 0-5 0 - 5 WBC/hpf   Bacteria, UA RARE (A) NONE SEEN   Squamous Epithelial / LPF 0-5 (A) NONE SEEN   Mucous PRESENT   Urine Drug Screen, Qualitative ([facility] only)     Status: Abnormal   Collection Time: 04/17/17  1:20 AM  Result Value Ref Range   Tricyclic, Ur Screen NONE DETECTED NONE DETECTED   Amphetamines, Ur Screen POSITIVE (A) NONE DETECTED   MDMA (Ecstasy)Ur Screen NONE DETECTED NONE DETECTED   Cocaine Metabolite,Ur [place] POSITIVE (A) NONE DETECTED   Opiate, Ur Screen NONE DETECTED NONE DETECTED   Phencyclidine (PCP) Ur S NONE DETECTED NONE DETECTED   Cannabinoid 50 Ng, Ur [place] POSITIVE (A) NONE DETECTED   Barbiturates, Ur Screen NONE DETECTED NONE DETECTED   Benzodiazepine, Ur Scrn POSITIVE (A) NONE DETECTED   Methadone Scn, Ur NONE DETECTED NONE DETECTED    Comment: (NOTE) 683  Tricyclics, urine               Cutoff 1000 ng/mL 200  Amphetamines, urine             Cutoff 1000 ng/mL 300  MDMA (Ecstasy), urine           Cutoff 500 ng/mL 400  Cocaine Metabolite, urine       Cutoff 300 ng/mL 500  Opiate, urine                   Cutoff 300 ng/mL 600  Phencyclidine (PCP), urine      Cutoff 25 ng/mL 700  Cannabinoid, urine              Cutoff 50 ng/mL 800  Barbiturates, urine             Cutoff 200 ng/mL 900  Benzodiazepine, urine           Cutoff 200 ng/mL 1000 Methadone, urine                 Cutoff 300 ng/mL 1100 1200 The urine drug screen provides only a preliminary, unconfirmed 1300 analytical test result and should not be used for non-medical 1400 purposes. Clinical consideration and professional judgment should 1500 be applied to any positive drug screen result due to possible 1600 interfering substances. A more specific alternate chemical method 1700 must be used in order to obtain a confirmed analytical result.  1800 Gas chromato graphy / mass spectrometry (GC/MS) is the preferred 1900 confirmatory method.   Pregnancy, urine     Status: None   Collection Time: 04/17/17  1:20 AM  Result Value Ref Range   Preg Test, Ur NEGATIVE NEGATIVE    Current Facility-Administered Medications  Medication Dose Route Frequency Provider Last Rate Last Dose  . ARIPiprazole (ABILIFY) tablet 20 mg  20 mg Oral QHS Tyjon Bowen T, MD      . Derrill Memo ON 04/18/2017] levothyroxine (SYNTHROID, LEVOTHROID) tablet 25 mcg  25 mcg Oral QAC breakfast Fontella Shan T, MD      . lithium carbonate (LITHOBID) CR tablet 300 mg  300 mg Oral Q12H Sheba Whaling T, MD      . prazosin (MINIPRESS) capsule 2 mg  2 mg Oral QHS Mitsuru Dault, Madie Reno, MD      .  traZODone (DESYREL) tablet 100 mg  100 mg Oral QHS Josselyne Onofrio T, MD      . Derrill Memo ON 04/18/2017] venlafaxine XR (EFFEXOR-XR) 24 hr capsule 225 mg  225 mg Oral Q breakfast Estalee Mccandlish, Madie Reno, MD       Current Outpatient Prescriptions  Medication Sig Dispense Refill  . chlordiazePOXIDE (LIBRIUM) 25 MG capsule Take 25 mg by mouth daily.    Marland Kitchen lithium carbonate (LITHOBID) 300 MG CR tablet Take 600 mg by mouth daily.    . prazosin (MINIPRESS) 2 MG capsule Take 1 capsule (2 mg total) by mouth at bedtime. (Patient taking differently: Take 2 mg by mouth 2 (two) times daily. ) 30 capsule 0  . traZODone (DESYREL) 100 MG tablet Take 1 tablet (100 mg total) by mouth at bedtime. 30 tablet 0  . venlafaxine XR (EFFEXOR-XR) 75 MG 24 hr capsule Take 3 capsules (225 mg total) by  mouth daily with breakfast. 90 capsule 0  . zolpidem (AMBIEN) 10 MG tablet Take 1 tablet (10 mg total) by mouth at bedtime as needed for sleep. 1 tablet 0  . ARIPiprazole (ABILIFY) 20 MG tablet Take 1 tablet (20 mg total) by mouth daily. (Patient not taking: Reported on 04/17/2017) 30 tablet 0  . hydrOXYzine (ATARAX/VISTARIL) 25 MG tablet Take 1 tablet (25 mg total) by mouth every 6 (six) hours as needed for anxiety. (Patient not taking: Reported on 04/15/2017) 30 tablet 0  . levothyroxine (SYNTHROID, LEVOTHROID) 25 MCG tablet Take 1 tablet (25 mcg total) by mouth daily before breakfast. (Patient not taking: Reported on 04/17/2017) 30 tablet 0  . lithium carbonate (ESKALITH) 450 MG CR tablet Take 1 tablet (450 mg total) by mouth at bedtime. (Patient not taking: Reported on 04/17/2017) 30 tablet 0    Musculoskeletal: Strength & Muscle Tone: within normal limits Gait & Station: normal Patient leans: N/A  Psychiatric Specialty Exam: Physical Exam  Nursing note and vitals reviewed. Constitutional: She appears well-developed and well-nourished.  HENT:  Head: Normocephalic and atraumatic.  Eyes: Pupils are equal, round, and reactive to light. Conjunctivae are normal.  Neck: Normal range of motion.  Cardiovascular: Regular rhythm and normal heart sounds.   Respiratory: Effort normal. No respiratory distress.  GI: Soft.  Musculoskeletal: Normal range of motion.  Neurological: She is alert.  Skin: Skin is warm and dry.  Psychiatric: Her speech is delayed. She is slowed and withdrawn. Cognition and memory are impaired. She expresses impulsivity. She exhibits a depressed mood. She expresses suicidal ideation.    Review of Systems  Constitutional: Negative.   HENT: Negative.   Eyes: Negative.   Respiratory: Negative.   Cardiovascular: Negative.   Gastrointestinal: Negative.   Musculoskeletal: Negative.   Skin: Negative.   Neurological: Negative.   Psychiatric/Behavioral: Positive for depression  and suicidal ideas. Negative for hallucinations, memory loss and substance abuse. The patient is nervous/anxious and has insomnia.     Blood pressure 94/64, pulse (!) 57, temperature 98.2 F (36.8 C), temperature source Oral, resp. rate 18, height '5\' 8"'  (1.727 m), weight 166 lb (75.3 kg), SpO2 96 %.Body mass index is 25.24 kg/m.  General Appearance: Disheveled  Eye Contact:  Minimal  Speech:  Slow  Volume:  Decreased  Mood:  Depressed  Affect:  Constricted  Thought Process:  Goal Directed  Orientation:  Full (Time, Place, and Person)  Thought Content:  Logical  Suicidal Thoughts:  Yes.  with intent/plan  Homicidal Thoughts:  No  Memory:  Immediate;   Good Recent;  Fair Remote;   Fair  Judgement:  Impaired  Insight:  Shallow  Psychomotor Activity:  Decreased  Concentration:  Concentration: Poor  Recall:  Poor  Fund of Knowledge:  Fair  Language:  Fair  Akathisia:  No  Handed:  Right  AIMS (if indicated):     Assets:  Desire for Improvement Housing  ADL's:  Impaired  Cognition:  Impaired,  Mild  Sleep:        Treatment Plan Summary: Daily contact with patient to assess and evaluate symptoms and progress in treatment, Medication management and Plan Patient returns to the hospital saying she is still having suicidal thoughts. When I spoke to her 2 days ago she was denying suicidal thoughts. She is looking more depressed and down today. Seems more hopeless. Patient will be admitted to the psychiatric unit. 15 minute checks in place. Continue usual outpatient medication. Counseling provided about substance abuse issues which I think will be crucial for her going forward.  Disposition: Recommend psychiatric Inpatient admission when medically cleared. Supportive therapy provided about ongoing stressors.  Alethia Berthold, MD 04/17/2017 3:33 PM

## 2017-04-17 NOTE — ED Notes (Signed)
BEHAVIORAL HEALTH ROUNDING Patient sleeping: No. Patient alert and oriented: yes Behavior appropriate: Yes.  ; If no, describe:  Nutrition and fluids offered: yes Toileting and hygiene offered: Yes  Sitter present: q15 minute observations and security  monitoring Law enforcement present: Yes  ODS  

## 2017-04-17 NOTE — ED Notes (Signed)
Dinner brought to patient 

## 2017-04-17 NOTE — ED Triage Notes (Signed)
Pt states she is feeling suicidal and cannot sleep. States was here 2 days ago for the same and [place]. Pt denies any drugs or alcohol today.

## 2017-04-17 NOTE — ED Notes (Signed)
ED  Is the patient under IVC or is there intent for IVC:  Voluntary  Is the patient medically cleared: Yes.   Is there vacancy in the ED BHU: Yes.   Is the population mix appropriate for patient: Yes.   Is the patient awaiting placement in inpatient or outpatient setting: Yes.   Has the patient had a psychiatric consult: Yes.   Survey of unit performed for contraband, proper placement and condition of furniture, tampering with fixtures in bathroom, shower, and each patient room: Yes.  ; Findings:  APPEARANCE/BEHAVIOR Calm and cooperative NEURO ASSESSMENT Orientation: oriented x3  Denies pain Hallucinations: No.None noted (Hallucinations) Speech: Normal Gait: normal RESPIRATORY ASSESSMENT Even  Unlabored respirations  CARDIOVASCULAR ASSESSMENT Pulses equal   regular rate  Skin warm and dry   GASTROINTESTINAL ASSESSMENT no GI complaint EXTREMITIES Full ROM  PLAN OF CARE Provide calm/safe environment. Vital signs assessed twice daily. ED BHU Assessment once each 12-hour shift. Collaborate with TTS daily or as condition indicates. Assure the ED provider has rounded once each shift. Provide and encourage hygiene. Provide redirection as needed. Assess for escalating behavior; address immediately and inform ED provider.  Assess family dynamic and appropriateness for visitation as needed: Yes.  ; If necessary, describe findings:  Educate the patient/family about BHU procedures/visitation: Yes.  ; If necessary, describe findings:

## 2017-04-17 NOTE — BH Assessment (Signed)
[facility] by Dr. Toni Amendlapacs.  Attending Physician will be Dr. Ardyth HarpsHernandez.   Patient has [facility] room 302, [facility] Gwen.   ER staff is aware of the admission Rivka Barbara( Glenda, ER Sect.; Dr. Mayford KnifeWilliams, ER MD; Vikki PortsValerie, Patient's Nurse & Clydie BraunKaren, Patient Access). Pt may transfer after 1930.

## 2017-04-17 NOTE — ED Notes (Signed)
Pt given lunch tray.

## 2017-04-17 NOTE — ED Notes (Signed)
Patient is voluntary and will be [facility].

## 2017-04-17 NOTE — ED Notes (Signed)

## 2017-04-17 NOTE — ED Notes (Signed)
Pt sleeping. Breakfast tray placed at bedside 

## 2017-04-17 NOTE — ED Notes (Addendum)
[facility], is wanded and oriented to unit. Pt reports SI but verbally contracts for safety. Pt denies A/V hallucinations at this time. Pt eating lunch on bed and given po fluids. Pt encouraged to voice concerns and ask questions. Pt remains safe with 15 minute checks.

## 2017-04-17 NOTE — ED Notes (Signed)
Patient observed lying in bed with eyes closed  Even, unlabored respirations observed   NAD pt appears to be sleeping  I will continue to monitor along with every 15 minute visual observations and ongoing security monitoring    

## 2017-04-17 NOTE — ED Notes (Signed)

## 2017-04-17 NOTE — ED Notes (Signed)
Patient made aware of upcoming transfer to lower level. Pt agreeable with plan.

## 2017-04-17 NOTE — BH Assessment (Signed)
Per Dr.Clapacs, pt [facility]- pt pending bed assignment.

## 2017-04-18 DIAGNOSIS — F3181 Bipolar II disorder: Principal | ICD-10-CM

## 2017-04-18 MED ORDER — ARIPIPRAZOLE 10 MG PO TABS
20.0000 mg | ORAL_TABLET | Freq: Every day | ORAL | Status: DC
  Administered 2017-04-18: 20 mg via ORAL
  Filled 2017-04-18: qty 2

## 2017-04-18 MED ORDER — VENLAFAXINE HCL ER 75 MG PO CP24
225.0000 mg | ORAL_CAPSULE | Freq: Every day | ORAL | Status: DC
  Administered 2017-04-18 – 2017-04-19 (×2): 225 mg via ORAL
  Filled 2017-04-18 (×2): qty 3

## 2017-04-18 MED ORDER — LEVOTHYROXINE SODIUM 50 MCG PO TABS
25.0000 ug | ORAL_TABLET | Freq: Every day | ORAL | Status: DC
  Administered 2017-04-18 – 2017-04-19 (×2): 25 ug via ORAL
  Filled 2017-04-18 (×2): qty 1

## 2017-04-18 MED ORDER — MAGNESIUM HYDROXIDE 400 MG/5ML PO SUSP: 30.0000 mL | Freq: Every day | ORAL | Status: DC | PRN

## 2017-04-18 MED ORDER — ALUM & MAG HYDROXIDE-SIMETH 200-200-20 MG/5ML PO SUSP: 30.0000 mL | ORAL | Status: DC | PRN

## 2017-04-18 MED ORDER — LITHIUM CARBONATE ER 300 MG PO TBCR
300.0000 mg | EXTENDED_RELEASE_TABLET | Freq: Two times a day (BID) | ORAL | Status: DC
  Administered 2017-04-18 – 2017-04-19 (×3): 300 mg via ORAL
  Filled 2017-04-18 (×3): qty 1

## 2017-04-18 MED ORDER — NICOTINE 21 MG/24HR TD PT24
21.0000 mg | MEDICATED_PATCH | Freq: Every day | TRANSDERMAL | Status: DC
  Administered 2017-04-18 – 2017-04-19 (×2): 21 mg via TRANSDERMAL
  Filled 2017-04-18 (×2): qty 1

## 2017-04-18 MED ORDER — PRAZOSIN HCL 2 MG PO CAPS: 2.0000 mg | ORAL_CAPSULE | Freq: Every day | ORAL | Status: DC

## 2017-04-18 MED ORDER — ACETAMINOPHEN 325 MG PO TABS: 650.0000 mg | ORAL_TABLET | Freq: Four times a day (QID) | ORAL | Status: DC | PRN

## 2017-04-18 MED ORDER — TRAZODONE HCL 100 MG PO TABS
100.0000 mg | ORAL_TABLET | Freq: Every day | ORAL | Status: DC
  Administered 2017-04-18: 100 mg via ORAL
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
## 2017-04-18 NOTE — Plan of Care (Signed)
Problem: Safety: Goal: Ability to remain free from injury will improve Outcome: Progressing Pt encouraged to take time and move slowly when ambulating.

## 2017-04-18 NOTE — [facility] Suicide Risk Assessment (Signed)
[facility] INPATIENT:  Family/Significant Other Suicide Prevention Education  Suicide Prevention Education:  Patient Refusal for Family/Significant Other Suicide Prevention Education: The patient Mercedes Forbes has refused to provide written consent for family/significant other to be provided Family/Significant Other Suicide Prevention Education during admission and/or prior to discharge.  Physician notified.  Angelica Frandsen G. Garnette CzechSampson MSW, LCSWA 04/18/2017, 10:47 AM
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
## 2017-04-18 NOTE — Plan of Care (Signed)
Problem: Safety: Goal: Ability to remain free from injury will improve Outcome: Progressing No injury reported or observed   

## 2017-04-18 NOTE — Tx Team (Signed)
Initial Treatment Plan 04/18/2017 5:17 AM Mercedes Forbes [id number]    PATIENT STRESSORS: Legal issue Medication change or noncompliance Substance abuse   PATIENT STRENGTHS: Average or above average intelligence Capable of independent living   PATIENT IDENTIFIED PROBLEMS:   Depression  + S/I  anxiety  Cocaine abuse             DISCHARGE CRITERIA:  Ability to meet basic life and health needs Improved stabilization in mood, thinking, and/or behavior Motivation to continue treatment in a less acute level of care Verbal commitment to aftercare and medication compliance  PRELIMINARY DISCHARGE PLAN: Attend aftercare/continuing care group Outpatient therapy Return to previous living arrangement  PATIENT/FAMILY INVOLVEMENT: This treatment plan has been presented to and reviewed with the patient, Mercedes Forbes,*.  The patient and family have been given the opportunity to ask questions and make suggestions.  Trinda PascalAmy  Jairo Bellew, RN 04/18/2017, 5:17 AM

## 2017-04-18 NOTE — [facility] Group Notes (Signed)
Goals Group  Date/Time: 04/18/2017, 9:00 AM Type of Therapy and Topic: Group Therapy: Goals Group: SMART Goals  ?  Participation Level: Moderate  ?  Description of Group:  ?  The purpose of a daily goals group is to assist and guide patients in setting recovery/wellness-related goals. The objective is to set goals as they relate to the crisis in which they were admitted. Patients will be using SMART goal modalities to set measurable goals. Characteristics of realistic goals will be discussed and patients will be assisted in setting and processing how one will reach their goal. Facilitator will also assist patients in applying interventions and coping skills learned in psycho-education groups to the SMART goal and process how one will achieve defined goal.  ?  Therapeutic Goals:  ?  -Patients will develop and document one goal related to or their crisis in which brought them into treatment.  -Patients will be guided by LCSW using SMART goal setting modality in how to set a measurable, attainable, realistic and time sensitive goal.  -Patients will process barriers in reaching goal.  -Patients will process interventions in how to overcome and successful in reaching goal.  ?  Patient's Goal: Did not attend. ?  Therapeutic Modalities:  Motivational Interviewing  Cognitive Behavioral Therapy  Crisis Intervention Model  SMART goals setting   Hampton AbbotKadijah Damiean Lukes, MSW, LCSW-A 04/18/2017, 10:47AM

## 2017-04-18 NOTE — Progress Notes (Signed)
Recreation Therapy Notes  Date: 08.02.18 Time: 1:00 pm Location: Craft Room  Group Topic: Leisure Education  Goal Area(s) Addresses:  Patient will identify activities for each letter of the alphabet. Patient will verbalize ability to integrate positive leisure into life post d/c. Patient will verbalize ability to use leisure as a Associate Professorcoping skill.  Behavioral Response: Did not attend  Intervention: Leisure Alphabet.  Activity: Patients were given a Leisure Information systems managerAlphabet worksheet and were instructed to write healthy leisure activities for each letter of the alphabet.   Education: LRT educated patients on what they need to participate in leisure.  Education Outcome: Patient did not attend group.  Clinical Observations/Feedback: Patient did not attend group.  Jacquelynn CreeGreene,Petar Mucci M, LRT/CTRS 04/18/2017 1:36 PM
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
## 2017-04-18 NOTE — BHH Suicide Risk Assessment (Signed)
[facility]   Nursing information obtained from:    Demographic factors:    Current Mental Status:    Loss Factors:    Historical Factors:    Risk Reduction Factors:      Principal Problem: <principal problem not specified> Diagnosis:   Patient Active Problem List   Diagnosis Date Noted  . Amphetamine abuse [F15.10] 04/15/2017  . Substance induced mood disorder (HCC) [F19.94] 04/15/2017  . Hypothyroidism [E03.9] 03/08/2017  . h/o Borderline personality disorder [F60.3] 03/06/2017  . 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 PTSD (post-traumatic stress disorder) [F43.10] 04/04/2016  . Tobacco use disorder [F17.200] 04/03/2016  . Bipolar 2 disorder, major depressive episode (HCC) [F31.81] 04/02/2016  . Cocaine use disorder, moderate, dependence (HCC) [F14.20] 04/02/2016  . Cannabis use disorder, moderate, dependence (HCC) [F12.20] 04/02/2016   Subjective Data:   Continued Clinical Symptoms:  Alcohol Use Disorder Identification Test Final Score (AUDIT): 0 The "Alcohol Use Disorders Identification Test", Guidelines for Use in Primary Care, Second Edition.  World Science writerHealth Organization [facility](WHO). Score between 0-7:  no or low risk or alcohol related problems. Score between 8-15:  moderate risk of alcohol related problems. Score between 16-19:  high risk of alcohol related problems. Score 20 or above:  warrants further diagnostic evaluation for alcohol dependence and treatment.   CLINICAL FACTORS:   Severe Anxiety and/or Agitation Depression:   Impulsivity Alcohol/Substance Abuse/Dependencies Previous Psychiatric Diagnoses and Treatments    Psychiatric Specialty Exam: Physical Exam   ROS   Blood pressure (!) 101/58, pulse 64, temperature 98.2 F (36.8 C), temperature source Oral, resp. rate 17, height 5\' 8"  (1.727 m), weight 76.2 kg (168 lb), SpO2 99 %.Body mass index is 25.54 kg/m.                                                     Sleep:  Number of Hours: 5.75      COGNITIVE FEATURES THAT CONTRIBUTE TO RISK:  Polarized thinking    SUICIDE RISK:   Moderate:  Frequent suicidal ideation with limited intensity, and duration, some specificity in terms of plans, no associated intent, good self-control, limited dysphoria/symptomatology, some risk factors present, and identifiable protective factors, including available and accessible social support.  [facility]  I certify that inpatient services furnished can reasonably be expected to improve the patient's condition.   Jimmy FootmanHernandez-Gonzalez,  Val Farnam, MD 04/18/2017, 9:32 AM

## 2017-04-18 NOTE — Progress Notes (Signed)
Pt appeared to sleep about 5.75 hours while monitored on 15 minute safety checks.

## 2017-04-18 NOTE — Progress Notes (Signed)
46 year old female WWF admitted to unit voluntarily . Alert and oriented x4. Reports having passive SI, but currently denies at present. Patient verbally contracts for safety. Denies current HI, AVH. Patient reports that current stressors are altercations with roommates,she spent 2 weeks in jail without medications and the death of her spouse 3.5 years ago. Pt reports she had been paying a bail bondsman every week and she said they thought she missed a payment and arrested her. She has a pending court date for breaking one of her rules of her probation. Pt reports she abuses Cocaine at times and has been using it more over the past couple of weeks.  She also used adderall this week and smokes mj regularly. Oriented patient to room and unit. Skin and contraband search completed and [facility]. Multiple tattoos to body observed, skin otherwise warm, dry and intact. No contraband found.  Admission assessment completed, fluid and nutrition offered.

## 2017-04-19 MED ORDER — ARIPIPRAZOLE 20 MG PO TABS: 20.0000 mg | ORAL_TABLET | Freq: Every day | ORAL | 0 refills | Status: DC

## 2017-04-19 MED ORDER — VENLAFAXINE HCL ER 75 MG PO CP24: 225.0000 mg | ORAL_CAPSULE | Freq: Every day | ORAL | 0 refills | Status: DC

## 2017-04-19 MED ORDER — TRAZODONE HCL 100 MG PO TABS: 100.0000 mg | ORAL_TABLET | Freq: Every day | ORAL | 0 refills | Status: DC

## 2017-04-19 NOTE — Progress Notes (Signed)
  [facility] :  Will you be returning to the same living situation after discharge:  Yes,  home with roommates At discharge, do you have transportation home?: Yes,  son Do you have the ability to pay for your medications: Yes,  patient has insurance  Release of information consent forms completed and in the chart;  Patient's signature needed at discharge.  Patient to Follow up at: Follow-up Information    [facility] Follow up on 04/22/2017.   Why:  Follow-up apointment for outpatient services & substance abuse intensive outpatient program. Peer support specialist [phone]([phone]) will pick you up at 7am for your appointment. Bring photo I.D., prescriptions, insurance info, discharge summary.   Contact information: [address] [place] [address] [phone]           Next level of care provider [facility] Link:no  Safety Planning and Suicide Prevention discussed: Yes,  with patient  Have you used any form of tobacco in the last 30 days? (Cigarettes, Smokeless Tobacco, Cigars, and/or Pipes): Yes  Has patient been referred to the Quitline?: Patient refused referral  Patient has been referred for addiction treatment: Yes  Arelia Longestmaris G Thane Age, LCSWA 04/19/2017, 11:26 AM

## 2017-04-19 NOTE — BHH Suicide Risk Assessment (Signed)
[facility]   Principal Problem: Bipolar 2 disorder, major depressive episode [facility](HCC) Discharge Diagnoses:  Patient Active Problem List   Diagnosis Date Noted  . Amphetamine abuse [F15.10] 04/15/2017  . Hypothyroidism [E03.9] 03/08/2017  . 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 PTSD (post-traumatic stress disorder) [F43.10] 04/04/2016  . Tobacco use disorder [F17.200] 04/03/2016  . Bipolar 2 disorder, major depressive episode (HCC) [F31.81] 04/02/2016  . Cocaine use disorder, moderate, dependence (HCC) [F14.20] 04/02/2016  . Cannabis use disorder, moderate, dependence (HCC) [F12.20] 04/02/2016     Psychiatric Specialty Exam: ROS   Blood pressure 103/66, pulse (!) 52, temperature 98.2 F (36.8 C), temperature source Oral, resp. rate 17, height 5\' 8"  (1.727 m), weight 76.2 kg (168 lb), SpO2 99 %.Body mass index is 25.54 kg/m.                                                       Mental Status Per Nursing Assessment::   On Admission:     Demographic Factors:  Divorced or widowed, Caucasian and Low socioeconomic status  Loss Factors: Loss of significant relationship, Legal issues and Financial problems/change in socioeconomic status  Historical Factors: Prior suicide attempts, Anniversary of important loss and Impulsivity  Risk Reduction Factors:   Sense of responsibility to family and Positive social support  Pt confirms no access to guns prior to discharge Denies SI or hopelessness Hopeful Bright affect   Continued Clinical Symptoms:  Alcohol/Substance Abuse/Dependencies Previous Psychiatric Diagnoses and Treatments  Cognitive Features That Contribute To Risk:  Polarized thinking    Suicide Risk:  Minimal: No identifiable suicidal ideation.  Patients presenting with no risk factors but with morbid ruminations; may be classified as minimal risk based on the severity of the depressive  symptoms    Jimmy FootmanHernandez-Gonzalez,  Jrake Rodriquez, MD 04/19/2017, 10:30 AM
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
## 2017-04-19 NOTE — Plan of Care (Signed)
Problem: Activity: Goal: Sleeping patterns will improve Outcome: Progressing Patient slept for Estimated Hours of 7.30; Precautionary checks every 15 minutes for safety maintained, room free of safety hazards, patient sustains no injury or falls during this shift.    

## 2017-04-19 NOTE — Progress Notes (Signed)
CH spoke briefly with patient. Patient has plan in place and is ready for discharge. She is worried her ride will not show up close to time she is released.    04/19/17 1100  Clinical Encounter Type  Visited With Patient;Health care provider  Visit Type Follow-up;Spiritual support;Behavioral Health  Referral From Patient;Nurse;Other (Comment) (Treatment team)

## 2017-04-19 NOTE — Progress Notes (Addendum)
Pt alert and oriented, pt in pleasant mood. Pt cooperative and compliant with medications. Pt denies HI, SI, or A/V hallucinations. 15 min safety checks continue. No distress noted, will continue to monitor. Pt slept 7.5 hours.

## 2017-04-19 NOTE — Progress Notes (Signed)
D: Patient is aware of  Discharge this shift .Patient denies suicidal /homicidal ideations. Patient received all belongings brought in  A: No Storage medications. Writer reviewed Discharge Summary, Suicide Risk Assessment, and Transitional Record.  . Aware  Of follow up appointment . R: Patient left unit with no questions  Or concerns  With son

## 2017-04-19 NOTE — Progress Notes (Signed)
Recreation Therapy Notes  Date: 08.03.18 Time: 1:00 pm Location: Craft Room  Group Topic: Coping Skills  Goal Area(s) Addresses:  Patient will verbalize benefit of using art as a coping skill. Patient will verbalize one emotion experienced in group.  Behavioral Response: Did not attend  Intervention: Art  Activity: Patients were given coloring sheets, paper to draw, and connect the dot sheets and were instructed to complete the activity while thinking about the emotions they were feeling as well as what their minds were focused on.  Education: LRT educated patients on healthy coping skills.  Education Outcome: Patient did not attend group.  Clinical Observations/Feedback: Patient did not attend group.  Jacquelynn CreeGreene,Lonnette Shrode M, LRT/CTRS 04/19/2017 1:50 PM

## 2017-04-19 NOTE — Progress Notes (Signed)
Recreation Therapy Notes  INPATIENT RECREATION TR PLAN  Patient Details Name: Mercedes Forbes MRN: [id number] DOB: 03/17/1971 Today's Date: 04/19/2017  Rec Therapy Plan Is patient appropriate for Therapeutic Recreation?: Yes Treatment times per week: At least once a week TR Treatment/Interventions: 1:1 session, Group participation (Comment) (Appropriate participation in daily recreational therapy tx)  Discharge Criteria Pt will be discharged from therapy if:: Treatment goals are met, Discharged Treatment plan/goals/alternatives discussed and agreed upon by:: Patient/family  Discharge Summary Short term goals set: See Care Plan Short term goals met: Complete Progress toward goals comments: One-to-one attended One-to-one attended: Self-esteem, stress management Reason goals not met: N/A Therapeutic equipment acquired: None Reason patient discharged from therapy: Discharge from hospital Pt/family agrees with progress & goals achieved: Yes Date patient discharged from therapy: 04/19/17   , M, LRT/CTRS 04/19/2017, 2:39 PM  

## 2017-04-19 NOTE — [facility] Group Notes (Signed)
[facility] LCSW Group Therapy   04/19/2017 9:30 AM   Type of Therapy: Group Therapy   Participation Level: Patient invited but did not attend.    Mayelin Panos, MSW, LCSW-A 04/19/2017, 10:33 AM  

## 2017-04-19 NOTE — Plan of Care (Signed)
Problem: [facility] Participation in Recreation Therapeutic Interventions Goal: STG-Patient will demonstrate improved self esteem by identif STG: Self-Esteem - Within 3 treatment sessions, patient will verbalize at least 5 positive affirmation statements in one treatment session to increase self-esteem.  Outcome: Completed/Met Date Met: 04/19/17 Treatment Session 1; Completed 1 out of 1: At approximately 10:20 am, LRT met with patient in consultation room. Patient verbalized 5 positive affirmation statements. Patient reported it felt "good". LRT encouraged patient to continue saying positive affirmation statements.   Leonette Monarch, LRT/CTRS 08.03.18 11:19 am Goal: STG-Patient will identify at least five coping skills for ** STG: Coping Skills - Within 3 treatment sessions, patient will verbalize at least 5 coping skills for substance abuse one treatment session to decrease substance abuse.  Outcome: Completed/Met Date Met: 04/19/17 Treatment Session 1; Completed 1 out of 1: At approximately 10:20 am, LRT met with patient in consultation room. Patient verbalized 5 coping skills for substance abuse. LRT educated patient on leisure and why it is important to implement it into her schedule. LRT educated and provided patient with blank schedules to help her plan her day and try to avoid using substances. LRT educated patient on healthy support systems.  Leonette Monarch, LRT/CTRS 08.03.18 11:20 am

## 2017-04-19 NOTE — Tx Team (Signed)
Interdisciplinary Treatment and Diagnostic Plan Update  04/19/2017 Time of Session: 10:30am Mercedes Forbes MRN: [id number]  Principal Diagnosis: Bipolar 2 disorder, major depressive episode (HCC)  Secondary Diagnoses: Principal Problem:   Bipolar 2 disorder, major depressive episode (HCC) Active Problems:   Cocaine use disorder, moderate, dependence (HCC)   Cannabis use disorder, moderate, dependence (HCC)   Tobacco use disorder   PTSD (post-traumatic stress disorder)   Opiate abuse, episodic   Hypothyroidism   Amphetamine abuse   Current Medications:  Current Facility-Administered Medications  Medication Dose Route Frequency Provider Last Rate Last Dose  . acetaminophen (TYLENOL) tablet 650 mg  650 mg Oral Q6H PRN Clapacs, John T, MD      . alum & mag hydroxide-simeth (MAALOX/MYLANTA) 200-200-20 MG/5ML suspension 30 mL  30 mL Oral Q4H PRN Clapacs, John T, MD      . ARIPiprazole (ABILIFY) tablet 20 mg  20 mg Oral QHS Clapacs, John T, MD   20 mg at 04/18/17 2130  . levothyroxine (SYNTHROID, LEVOTHROID) tablet 25 mcg  25 mcg Oral QAC breakfast Clapacs, Jackquline DenmarkJohn T, MD   25 mcg at 04/19/17 [phone]  . lithium carbonate (LITHOBID) CR tablet 300 mg  300 mg Oral Q12H Clapacs, Jackquline DenmarkJohn T, MD   300 mg at 04/19/17 0807  . magnesium hydroxide (MILK OF MAGNESIA) suspension 30 mL  30 mL Oral Daily PRN Clapacs, John T, MD      . nicotine (NICODERM CQ - dosed in mg/24 hours) patch 21 mg  21 mg Transdermal Daily Jimmy FootmanHernandez-Gonzalez, Andrea, MD   21 mg at 04/19/17 0809  . traZODone (DESYREL) tablet 100 mg  100 mg Oral QHS Clapacs, John T, MD   100 mg at 04/18/17 2200  . venlafaxine XR (EFFEXOR-XR) 24 hr capsule 225 mg  225 mg Oral Q breakfast Clapacs, Jackquline DenmarkJohn T, MD   225 mg at 04/19/17 0805   PTA Medications: Prescriptions Prior to Admission  Medication Sig Dispense Refill Last Dose  . ARIPiprazole (ABILIFY) 20 MG tablet Take 1 tablet (20 mg total) by mouth daily. (Patient not taking: Reported on 04/17/2017) 30  tablet 0 Not Taking at Unknown time  . chlordiazePOXIDE (LIBRIUM) 25 MG capsule Take 25 mg by mouth daily.   Past Month at Unknown time  . hydrOXYzine (ATARAX/VISTARIL) 25 MG tablet Take 1 tablet (25 mg total) by mouth every 6 (six) hours as needed for anxiety. (Patient not taking: Reported on 04/15/2017) 30 tablet 0 Not Taking at Unknown time  . levothyroxine (SYNTHROID, LEVOTHROID) 25 MCG tablet Take 1 tablet (25 mcg total) by mouth daily before breakfast. (Patient not taking: Reported on 04/17/2017) 30 tablet 0 Not Taking at Unknown time  . lithium carbonate (ESKALITH) 450 MG CR tablet Take 1 tablet (450 mg total) by mouth at bedtime. (Patient not taking: Reported on 04/17/2017) 30 tablet 0 Not Taking at Unknown time  . lithium carbonate (LITHOBID) 300 MG CR tablet Take 600 mg by mouth daily.   Past Month at Unknown time  . prazosin (MINIPRESS) 2 MG capsule Take 1 capsule (2 mg total) by mouth at bedtime. (Patient taking differently: Take 2 mg by mouth 2 (two) times daily. ) 30 capsule 0 Past Month at Unknown time  . traZODone (DESYREL) 100 MG tablet Take 1 tablet (100 mg total) by mouth at bedtime. 30 tablet 0 Past Month at Unknown time  . venlafaxine XR (EFFEXOR-XR) 75 MG 24 hr capsule Take 3 capsules (225 mg total) by mouth daily with breakfast. 90 capsule 0  Past Month at Unknown time  . zolpidem (AMBIEN) 10 MG tablet Take 1 tablet (10 mg total) by mouth at bedtime as needed for sleep. 1 tablet 0 Past Month at Unknown time    Patient Stressors: Legal issue Medication change or noncompliance Substance abuse  Patient Strengths: Average or above average intelligence Capable of independent living  Treatment Modalities: Medication Management, Group therapy, Case management,  1 to 1 session with clinician, Psychoeducation, Recreational therapy.   Physician Treatment Plan for Primary Diagnosis: Bipolar 2 disorder, major depressive episode (HCC) Long Term Goal(s): Improvement in symptoms so as ready  for discharge Improvement in symptoms so as ready for discharge   Short Term Goals: Ability to identify changes in lifestyle to reduce recurrence of condition will improve Ability to demonstrate self-control will improve Ability to identify and develop effective coping behaviors will improve Ability to identify triggers associated with substance abuse/mental health issues will improve  Medication Management: Evaluate patient's response, side effects, and tolerance of medication regimen.  Therapeutic Interventions: 1 to 1 sessions, Unit Group sessions and Medication administration.  Evaluation of Outcomes: Adequate for Discharge  Physician Treatment Plan for Secondary Diagnosis: Principal Problem:   Bipolar 2 disorder, major depressive episode (HCC) Active Problems:   Cocaine use disorder, moderate, dependence (HCC)   Cannabis use disorder, moderate, dependence (HCC)   Tobacco use disorder   PTSD (post-traumatic stress disorder)   Opiate abuse, episodic   Hypothyroidism   Amphetamine abuse  Long Term Goal(s): Improvement in symptoms so as ready for discharge Improvement in symptoms so as ready for discharge   Short Term Goals: Ability to identify changes in lifestyle to reduce recurrence of condition will improve Ability to demonstrate self-control will improve Ability to identify and develop effective coping behaviors will improve Ability to identify triggers associated with substance abuse/mental health issues will improve     Medication Management: Evaluate patient's response, side effects, and tolerance of medication regimen.  Therapeutic Interventions: 1 to 1 sessions, Unit Group sessions and Medication administration.  Evaluation of Outcomes: Adequate for Discharge   RN Treatment Plan for Primary Diagnosis: Bipolar 2 disorder, major depressive episode (HCC) Long Term Goal(s): Knowledge of disease and therapeutic regimen to maintain health will improve  Short Term  Goals: Ability to verbalize feelings will improve and Ability to disclose and discuss suicidal ideas  Medication Management: RN will administer medications as ordered by provider, will assess and evaluate patient's response and provide education to patient for prescribed medication. RN will report any adverse and/or side effects to prescribing provider.  Therapeutic Interventions: 1 on 1 counseling sessions, Psychoeducation, Medication administration, Evaluate responses to treatment, Monitor vital signs and CBGs as ordered, Perform/monitor CIWA, COWS, AIMS and Fall Risk screenings as ordered, Perform wound care treatments as ordered.  Evaluation of Outcomes: Adequate for Discharge   LCSW Treatment Plan for Primary Diagnosis: Bipolar 2 disorder, major depressive episode (HCC) Long Term Goal(s): Safe transition to appropriate next level of care at discharge, Engage patient in therapeutic group addressing interpersonal concerns.  Short Term Goals: Engage patient in aftercare planning with referrals and resources and Identify triggers associated with mental health/substance abuse issues  Therapeutic Interventions: Assess for all discharge needs, 1 to 1 time with Social worker, Explore available resources and support systems, Assess for adequacy in community support network, Educate family and significant other(s) on suicide prevention, Complete Psychosocial Assessment, Interpersonal group therapy.  Evaluation of Outcomes: Adequate for Discharge   Progress in Treatment: Attending groups: Yes. Participating in groups:  Yes. Taking medication as prescribed: Yes. Toleration medication: Yes. Family/Significant other contact made: No, will contact:  patient refused. Patient understands diagnosis: Yes. Discussing patient identified problems/goals with staff: Yes. Medical problems stabilized or resolved: Yes. Denies suicidal/homicidal ideation: Yes. Issues/concerns per patient self-inventory:  No. Other: n/a  New problem(s) identified: None identified at this time.   New Short Term/Long Term Goal(s): "I need to follow-up at discharge".   Discharge Plan or Barriers: Patient will follow-up with [facility] for outpatient services and begin the substance abuse intensive outpatient program.   Reason for Continuation of Hospitalization: discharge 04/19/2017  Estimated Length of Stay: discharge 04/19/2017  Attendees: Patient: Mercedes Birksmaris G. Garnette CzechSampson MSW, LCSWA 04/19/2017 11:26 AM  Physician: Dr. Radene JourneyAndrea HernandezJayme Cloud- Gonzalez, MD 04/19/2017 11:26 AM  Nursing:  04/19/2017 11:26 AM  RN Care Manager: 04/19/2017 11:26 AM  Social Worker: Mercedes BirksAmaris G. Garnette CzechSampson MSW, LCSWA 04/19/2017 11:26 AM  Recreational Therapist: Jacquelynn CreeElizabeth M. Greene, LRT/CTRS 04/19/2017 11:26 AM  Other: Clementeen HoofGregory J. Troy SineWierda, LCSW 04/19/2017 11:26 AM  Other:  04/19/2017 11:26 AM  Other: 04/19/2017 11:26 AM    Scribe for Treatment Team: Arelia LongestAmaris G Casimir Barcellos, LCSWA 04/19/2017 11:30 AM

## 2018-02-25 ENCOUNTER — Other Ambulatory Visit: Payer: Self-pay | Admitting: Internal Medicine

## 2018-02-25 DIAGNOSIS — M47812 Spondylosis without myelopathy or radiculopathy, cervical region: Secondary | ICD-10-CM

## 2018-03-07 ENCOUNTER — Ambulatory Visit
Admission: RE | Admit: 2018-03-07 | Discharge: 2018-03-07 | Disposition: A | Discharge status: Home / Self Care | Payer: Medicare Other | Source: Ambulatory Visit | Attending: Internal Medicine | Admitting: Internal Medicine

## 2018-03-19 ENCOUNTER — Ambulatory Visit
Admission: RE | Admit: 2018-03-19 | Discharge: 2018-03-19 | Disposition: A | Discharge status: Home / Self Care | Payer: Medicare Other | Source: Ambulatory Visit | Attending: Internal Medicine | Admitting: Internal Medicine

## 2018-03-19 DIAGNOSIS — M542 Cervicalgia: Secondary | ICD-10-CM | POA: Insufficient documentation

## 2018-03-19 DIAGNOSIS — M4802 Spinal stenosis, cervical region: Secondary | ICD-10-CM | POA: Insufficient documentation

## 2018-03-19 DIAGNOSIS — M47812 Spondylosis without myelopathy or radiculopathy, cervical region: Secondary | ICD-10-CM

## 2018-03-19 MED ORDER — GADOBENATE DIMEGLUMINE 529 MG/ML IV SOLN
15.0000 mL | Freq: Once | INTRAVENOUS | Status: AC | PRN
  Administered 2018-03-19: 15 mL via INTRAVENOUS

## 2018-04-03 ENCOUNTER — Other Ambulatory Visit: Payer: Self-pay

## 2018-04-03 ENCOUNTER — Encounter: Payer: Self-pay | Admitting: *Deleted

## 2018-04-03 ENCOUNTER — Emergency Department
Admission: EM | Admit: 2018-04-03 | Discharge: 2018-04-03 | Disposition: A | Discharge status: Home / Self Care | Payer: Medicare Other | Attending: Emergency Medicine | Admitting: Emergency Medicine

## 2018-04-03 DIAGNOSIS — Y929 Unspecified place or not applicable: Secondary | ICD-10-CM | POA: Diagnosis not present

## 2018-04-03 DIAGNOSIS — E039 Hypothyroidism, unspecified: Secondary | ICD-10-CM | POA: Insufficient documentation

## 2018-04-03 DIAGNOSIS — Z79899 Other long term (current) drug therapy: Secondary | ICD-10-CM | POA: Insufficient documentation

## 2018-04-03 DIAGNOSIS — F17200 Nicotine dependence, unspecified, uncomplicated: Secondary | ICD-10-CM | POA: Diagnosis not present

## 2018-04-03 DIAGNOSIS — Y999 Unspecified external cause status: Secondary | ICD-10-CM | POA: Diagnosis not present

## 2018-04-03 DIAGNOSIS — W57XXXA Bitten or stung by nonvenomous insect and other nonvenomous arthropods, initial encounter: Secondary | ICD-10-CM | POA: Diagnosis not present

## 2018-04-03 DIAGNOSIS — S80869A Insect bite (nonvenomous), unspecified lower leg, initial encounter: Secondary | ICD-10-CM | POA: Diagnosis present

## 2018-04-03 DIAGNOSIS — S80862A Insect bite (nonvenomous), left lower leg, initial encounter: Secondary | ICD-10-CM | POA: Diagnosis not present

## 2018-04-03 DIAGNOSIS — Y93K1 Activity, walking an animal: Secondary | ICD-10-CM | POA: Insufficient documentation

## 2018-04-03 DIAGNOSIS — S80861A Insect bite (nonvenomous), right lower leg, initial encounter: Secondary | ICD-10-CM | POA: Insufficient documentation

## 2018-04-03 MED ORDER — HYDROCODONE-ACETAMINOPHEN 5-325 MG PO TABS
1.0000 | ORAL_TABLET | Freq: Once | ORAL | Status: AC
  Administered 2018-04-03: 1 via ORAL
  Filled 2018-04-03: qty 1

## 2018-04-03 MED ORDER — DOXYCYCLINE HYCLATE 100 MG PO CAPS: 100.0000 mg | ORAL_CAPSULE | Freq: Two times a day (BID) | ORAL | 0 refills | Status: DC

## 2018-04-03 NOTE — ED Triage Notes (Signed)
Pt reports having been walking dog in the woods a couple days ago and since then she has seen a lot of ticks on her dog. Pt thought she had chiggers because they had been under her skin but pt took her dog to the vet today and was told they were ticks. Dog was placed on doxycycline and pt reports she is now concerned that all the little insects she has been pulling out of her skin are ticks as well. No fevers but pt reports having a headache at this time with intermittent nausea.

## 2018-04-03 NOTE — Discharge Instructions (Signed)
Follow-up with your primary care provider Dr. Juel BurrowMasoud if any continued problems. Begin taking doxycycline 100 mg twice daily for 7 days.  Do not spend a lot of time in the sun as this medication could cause blistering and called you to burn easily.

## 2018-04-03 NOTE — ED Provider Notes (Signed)
[facility] Emergency Department Provider Note  ___________________________________________   First MD Initiated Contact with Patient 04/03/18 1303     (approximate)  I have reviewed the triage vital signs and the nursing notes.   HISTORY  Chief Complaint Insect Bite   HPI Mercedes Forbes is a 47 y.o. female presents to the ED with complaint of multiple tick bites.  Patient states that she had her dog up walking in the woods and she noticed a lot of ticks on her dog.  She also thought that she had chiggers which have been itching.  Patient states that she took her dog to the veterinarian's today and it was discovered that the dog had multiple [place] ticks.  Patient has been pulling ticks out from her skin which look the same as what was taken off her dog.  She denies any fever or chills.  She states she is having a frontal headache.  She denies any joint pain or rash.  Rates her discomfort as an 8 out of 10.  Past Medical History:  Diagnosis Date  . Arthritis   . Bipolar 1 disorder (HCC)   . Headache    migraines  . Personality disorder (HCC)   . Polysubstance abuse (HCC)   . Schizophrenia [facility](HCC)     Patient Active Problem List   Diagnosis Date Noted  . 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 Hypothyroidism 03/08/2017  . Opiate abuse, episodic (HCC) 07/22/2016  . PTSD (post-traumatic stress disorder) 04/04/2016  . Tobacco use disorder 04/03/2016  . Bipolar 2 disorder, major depressive episode (HCC) 04/02/2016  . Cocaine use disorder, moderate, dependence (HCC) 04/02/2016  . Cannabis use disorder, moderate, dependence (HCC) 04/02/2016    Past Surgical History:  Procedure Laterality Date  . KNEE ARTHROSCOPY WITH PATELLA RECONSTRUCTION     left patella removed  . wrists Bilateral     Prior to Admission medications   Medication Sig Start Date End Date Taking? Authorizing Provider  atorvastatin (LIPITOR) 20 MG tablet Take 20 mg by mouth  daily.   Yes [provider]  busPIRone (BUSPAR) 30 MG tablet Take 30 mg by mouth 2 (two) times daily.   Yes [provider]  DULoxetine (CYMBALTA) 60 MG capsule Take 60 mg by mouth daily.   Yes [provider]  hydrOXYzine (ATARAX/VISTARIL) 50 MG tablet Take 50 mg by mouth 3 (three) times daily as needed.   Yes [provider]  meloxicam (MOBIC) 15 MG tablet Take 15 mg by mouth daily.   Yes [provider]  ranitidine (ZANTAC) 150 MG capsule Take 150 mg by mouth 2 (two) times daily.   Yes [provider]  traZODone (DESYREL) 100 MG tablet Take 1 tablet (100 mg total) by mouth at bedtime. 04/19/17  Yes Jimmy FootmanHernandez-Gonzalez, Andrea, MD  ARIPiprazole (ABILIFY) 20 MG tablet Take 1 tablet (20 mg total) by mouth at bedtime. 04/19/17   Jimmy FootmanHernandez-Gonzalez, Andrea, MD  doxycycline (VIBRAMYCIN) 100 MG capsule Take 1 capsule (100 mg total) by mouth 2 (two) times daily. 04/03/18   Tommi RumpsSummers, Clemente Dewey L, PA-C  levothyroxine (SYNTHROID, LEVOTHROID) 25 MCG tablet Take 1 tablet (25 mcg total) by mouth daily before breakfast. Patient not taking: Reported on 04/17/2017 03/09/17   Jimmy FootmanHernandez-Gonzalez, Andrea, MD  venlafaxine XR (EFFEXOR-XR) 75 MG 24 hr capsule Take 3 capsules (225 mg total) by mouth daily with breakfast. 04/20/17   Jimmy FootmanHernandez-Gonzalez, Andrea, MD    Allergies Aspirin; Flurbiprofen; and Nsaids  History reviewed. No pertinent family history.  Social History Social History   Tobacco Use  . Smoking status: Current Every Day Smoker    Packs/day: 0.25  . Smokeless tobacco: Never Used  . Tobacco comment: Nicotine patch was ordered  Substance Use Topics  . Alcohol use: No  . Drug use: Yes    Types: Cocaine, Amphetamines, Marijuana    Comment: last 1 month ago    Review of Systems Constitutional: No fever/chills Eyes: No visual changes. ENT: No sore throat. Cardiovascular: Denies chest pain. Respiratory: Denies shortness of breath. Gastrointestinal:    No nausea, no vomiting.   Musculoskeletal: Negative for joint pain. Skin: Negative for rash.  Positive for multiple tick bites. Neurological: Positive for headaches, negative for focal weakness or numbness. ____________________________________________   PHYSICAL EXAM:  VITAL SIGNS: ED Triage Vitals  Enc Vitals Group     BP 04/03/18 1256 (!) 123/98     Pulse Rate 04/03/18 1256 100     Resp 04/03/18 1256 16     Temp 04/03/18 1256 98.1 F (36.7 C)     Temp Source 04/03/18 1256 Oral     SpO2 04/03/18 1256 96 %     Weight 04/03/18 1257 193 lb (87.5 kg)     Height 04/03/18 1257 5\' 8"  (1.727 m)     Head Circumference --      Peak Flow --      Pain Score 04/03/18 1257 8     Pain Loc --      Pain Edu? --      Excl. in GC? --    Constitutional: Alert and oriented. Well appearing and in no acute distress.  Patient has multiple tics with her in the ED currently. Eyes: Conjunctivae are normal.  Head: Atraumatic. Neck: No stridor.   Cardiovascular: Normal rate, regular rhythm. Grossly normal heart sounds.  Good peripheral circulation. Respiratory: Normal respiratory effort.  No retractions. Lungs CTAB. Musculoskeletal: Moves upper and lower extremities without any difficulty normal gait was noted. Neurologic:  Normal speech and language. No gross focal neurologic deficits are appreciated. No gait instability. Skin:  Skin is warm, dry.  Patient has multiple tick bites on her lower extremities.  Patient has used tweezers to take some of the ticks out from underneath her skin.  There is no evidence at this time of a cellulitis due to her self-inflicted wounds.  No drainage is noted. Psychiatric: Mood and affect are normal. Speech and behavior are normal.  ____________________________________________   LABS (all labs ordered are listed, but only abnormal results are displayed)  Labs Reviewed - No data to display  PROCEDURES  Procedure(s) performed: None  Procedures  Critical Care  performed: No  ____________________________________________   INITIAL IMPRESSION / ASSESSMENT AND PLAN / ED COURSE  As part of my medical decision making, I reviewed the following data within the electronic MEDICAL RECORD NUMBER Notes from prior ED visits and [place] Controlled Substance Database  Patient presents with multiple tick bites.  She is concerned because her dog was seen at that today and the ticks removed from him were [employer].  She is here for treatment.  She states her dog was placed on doxycycline by the vet.  Patient was given a prescription for doxycycline.  She also received a Betadine surgical scrub brush in order to clean her legs as she is quite concerned about this.  ____________________________________________   FINAL CLINICAL IMPRESSION(S) / ED DIAGNOSES  Final diagnoses:  Tick bite, initial encounter     ED Discharge Orders  Ordered    doxycycline (VIBRAMYCIN) 100 MG capsule  2 times daily     04/03/18 1316       Note:  This document was prepared using Dragon voice recognition software and may include unintentional dictation errors.    Tommi Rumps, PA-C 04/03/18 1612    Jene Every, MD 04/08/18 0700

## 2018-04-03 NOTE — ED Notes (Signed)
See triage note  States she noticed multiple areas of bites to legs,back and in her head  States she thinks it is ticks

## 2018-04-21 DIAGNOSIS — R200 Anesthesia of skin: Secondary | ICD-10-CM | POA: Insufficient documentation

## 2018-07-15 ENCOUNTER — Ambulatory Visit: Payer: Medicare Other | Attending: Nurse Practitioner | Admitting: Nurse Practitioner

## 2018-07-15 ENCOUNTER — Other Ambulatory Visit: Payer: Self-pay

## 2018-07-15 ENCOUNTER — Encounter: Payer: Self-pay | Admitting: Nurse Practitioner

## 2018-07-15 VITALS — BP 124/84 | HR 90 | Temp 98.1°F | Resp 16 | Ht 68.0 in | Wt 190.0 lb

## 2018-07-15 DIAGNOSIS — Z823 Family history of stroke: Secondary | ICD-10-CM | POA: Diagnosis not present

## 2018-07-15 DIAGNOSIS — Z789 Other specified health status: Secondary | ICD-10-CM | POA: Insufficient documentation

## 2018-07-15 DIAGNOSIS — Z833 Family history of diabetes mellitus: Secondary | ICD-10-CM | POA: Diagnosis not present

## 2018-07-15 DIAGNOSIS — R531 Weakness: Secondary | ICD-10-CM | POA: Diagnosis not present

## 2018-07-15 DIAGNOSIS — Z79891 Long term (current) use of opiate analgesic: Secondary | ICD-10-CM | POA: Diagnosis not present

## 2018-07-15 DIAGNOSIS — F609 Personality disorder, unspecified: Secondary | ICD-10-CM | POA: Diagnosis not present

## 2018-07-15 DIAGNOSIS — M542 Cervicalgia: Secondary | ICD-10-CM | POA: Insufficient documentation

## 2018-07-15 DIAGNOSIS — G8929 Other chronic pain: Secondary | ICD-10-CM

## 2018-07-15 DIAGNOSIS — M79601 Pain in right arm: Secondary | ICD-10-CM | POA: Diagnosis not present

## 2018-07-15 DIAGNOSIS — F209 Schizophrenia, unspecified: Secondary | ICD-10-CM | POA: Diagnosis not present

## 2018-07-15 DIAGNOSIS — Z8261 Family history of arthritis: Secondary | ICD-10-CM | POA: Diagnosis not present

## 2018-07-15 DIAGNOSIS — G894 Chronic pain syndrome: Secondary | ICD-10-CM | POA: Insufficient documentation

## 2018-07-15 DIAGNOSIS — F4310 Post-traumatic stress disorder, unspecified: Secondary | ICD-10-CM | POA: Diagnosis not present

## 2018-07-15 DIAGNOSIS — R29898 Other symptoms and signs involving the musculoskeletal system: Secondary | ICD-10-CM

## 2018-07-15 DIAGNOSIS — M79605 Pain in left leg: Secondary | ICD-10-CM | POA: Diagnosis not present

## 2018-07-15 DIAGNOSIS — M1990 Unspecified osteoarthritis, unspecified site: Secondary | ICD-10-CM | POA: Diagnosis not present

## 2018-07-15 DIAGNOSIS — M5442 Lumbago with sciatica, left side: Secondary | ICD-10-CM | POA: Diagnosis not present

## 2018-07-15 DIAGNOSIS — M5441 Lumbago with sciatica, right side: Secondary | ICD-10-CM | POA: Diagnosis not present

## 2018-07-15 DIAGNOSIS — M79604 Pain in right leg: Secondary | ICD-10-CM | POA: Diagnosis not present

## 2018-07-15 DIAGNOSIS — M79602 Pain in left arm: Secondary | ICD-10-CM | POA: Insufficient documentation

## 2018-07-15 DIAGNOSIS — Z8249 Family history of ischemic heart disease and other diseases of the circulatory system: Secondary | ICD-10-CM | POA: Diagnosis not present

## 2018-07-15 DIAGNOSIS — Z79899 Other long term (current) drug therapy: Secondary | ICD-10-CM | POA: Insufficient documentation

## 2018-07-15 DIAGNOSIS — Z886 Allergy status to analgesic agent status: Secondary | ICD-10-CM | POA: Insufficient documentation

## 2018-07-15 DIAGNOSIS — Z841 Family history of disorders of kidney and ureter: Secondary | ICD-10-CM | POA: Insufficient documentation

## 2018-07-15 DIAGNOSIS — M79641 Pain in right hand: Secondary | ICD-10-CM | POA: Insufficient documentation

## 2018-07-15 DIAGNOSIS — F419 Anxiety disorder, unspecified: Secondary | ICD-10-CM | POA: Diagnosis not present

## 2018-07-15 DIAGNOSIS — M25561 Pain in right knee: Secondary | ICD-10-CM | POA: Insufficient documentation

## 2018-07-15 DIAGNOSIS — F3181 Bipolar II disorder: Secondary | ICD-10-CM | POA: Insufficient documentation

## 2018-07-15 DIAGNOSIS — K219 Gastro-esophageal reflux disease without esophagitis: Secondary | ICD-10-CM | POA: Diagnosis not present

## 2018-07-15 DIAGNOSIS — M899 Disorder of bone, unspecified: Secondary | ICD-10-CM | POA: Insufficient documentation

## 2018-07-15 DIAGNOSIS — E039 Hypothyroidism, unspecified: Secondary | ICD-10-CM | POA: Insufficient documentation

## 2018-07-15 DIAGNOSIS — F17210 Nicotine dependence, cigarettes, uncomplicated: Secondary | ICD-10-CM | POA: Insufficient documentation

## 2018-07-15 DIAGNOSIS — M79642 Pain in left hand: Secondary | ICD-10-CM

## 2018-07-15 NOTE — Patient Instructions (Signed)

## 2018-07-15 NOTE — Progress Notes (Signed)
Safety precautions to be maintained throughout the outpatient stay will include: orient to surroundings, keep bed in low position, maintain call bell within reach at all times, provide assistance with transfer out of bed and ambulation.  

## 2018-07-15 NOTE — Progress Notes (Signed)
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   (CRP: Acute Phase) (ESR: Chronic Phase) Renal Function Markers Lab Results  Component Value Date   BUN 8 04/17/2017   CREATININE 0.96 04/17/2017   GFRAA >60 04/17/2017   GFRNONAA >60 04/17/2017   Hepatic Function Markers Lab Results  Component Value Date   AST 18 04/17/2017   ALT 10 (L) 04/17/2017   ALBUMIN 4.2 04/17/2017   ALKPHOS 73 04/17/2017   Electrolytes Lab Results  Component Value Date   NA 139 04/17/2017   K 3.7 04/17/2017   CL 108 04/17/2017   CALCIUM 8.9 04/17/2017   Neuropathy Markers No results found for: [id number] Bone Pathology Markers Lab Results  Component Value Date   ALKPHOS 73 04/17/2017   CALCIUM 8.9 04/17/2017   Coagulation Parameters Lab Results  Component Value Date   PLT 246 04/17/2017   Cardiovascular Markers Lab Results  Component Value Date   HGB 12.1 04/17/2017   HCT 35.6 04/17/2017   Note: Lab results reviewed.  [place]  Drug: Ms. Dolbow  reports that  she has current or past drug history. Drugs: Cocaine, Amphetamines, and Marijuana. Alcohol:  reports that she does not drink alcohol. Tobacco:  reports that she has been smoking cigarettes. She has been smoking about 0.25 packs per day. She has never used smokeless tobacco. Medical:  has a past medical history of Allergy, Anxiety, Arthritis, Bipolar 1 disorder ([place]), GERD (gastroesophageal reflux disease), Headache, Personality disorder ([place]), Polysubstance abuse ([place]), and Schizophrenia ([place]). Family: family history includes Arthritis in her mother; Diabetes in her father; Heart disease in her mother; Hyperlipidemia in her mother; Hypertension in her mother; Kidney disease in her mother; Stroke in her father.  Past Surgical History:  Procedure Laterality Date  . KNEE ARTHROSCOPY WITH PATELLA RECONSTRUCTION     left patella removed  . wrists Bilateral    Active Ambulatory Problems    Diagnosis Date Noted  . Bipolar 2 disorder, major depressive episode ([place]) 04/02/2016  . Cocaine use disorder, moderate, dependence ([place]) 04/02/2016  . Cannabis use disorder, moderate, dependence ([place]) 04/02/2016  . Tobacco use disorder 04/03/2016  . PTSD (post-traumatic stress disorder) 04/04/2016  . Opiate abuse, episodic ([place]) 07/22/2016  . Hypothyroidism 03/08/2017  . 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 Arm numbness 04/21/2018  . Bilateral arm pain 04/21/2018  . Neck pain, bilateral 04/21/2018  . Chronic  neck pain (Primary Area of Pain) (R>L) 07/15/2018  . Chronic upper extremity pain (Secondary Area of Pain) (R>L) 07/15/2018  . Chronic pain of right knee [facility] Area of Pain) 07/15/2018  . Chronic bilateral low back pain with bilateral sciatica ([name]) (R>L) 07/15/2018  . Chronic pain of both lower extremities (R>L) 07/15/2018  . Pain in both hands (L>R) 07/15/2018  . Chronic pain syndrome 07/15/2018  . Long term current use of opiate analgesic 07/15/2018  . Pharmacologic therapy  07/15/2018  . Disorder of skeletal system 07/15/2018  . Problems influencing health status 07/15/2018  . Lower extremity weakness 07/15/2018   Resolved Ambulatory Problems    Diagnosis Date Noted  . No Resolved Ambulatory Problems   Past Medical History:  Diagnosis Date  . Allergy   . Anxiety   . Arthritis   . Bipolar 1 disorder ([place])   . GERD (gastroesophageal reflux disease)   . Headache   . Personality disorder ([place])   . Polysubstance abuse ([place])   . Schizophrenia ([place])    Constitutional Exam  General appearance: Well nourished, well developed, and well hydrated. In no apparent acute distress Vitals:   07/15/18 1024  BP: 124/84  Pulse: 90  Resp: 16  Temp: 98.1 F (36.7 C)  SpO2: 98%  Weight: 190 lb (86.2 kg)  Height: _0  (1.727 m)   BMI Assessment: Estimated body mass index is 28.89 kg/m as calculated from the following:   Height as of this encounter: _1  (1.727 m).   Weight as of this encounter: 190 lb (86.2 kg).  BMI interpretation table: BMI level Category Range association with higher incidence of chronic pain  <18 kg/m2 Underweight   18.5-24.9 kg/m2 Ideal body weight   25-29.9 kg/m2 Overweight Increased incidence by 20%  30-34.9 kg/m2 Obese (Class I) Increased incidence by 68%  35-39.9 kg/m2 Severe obesity (Class II) Increased incidence by 136%  >40 kg/m2 Extreme obesity (Class III) Increased incidence by 254%   BMI Readings from Last 4 Encounters:  07/15/18 28.89 kg/m  04/03/18 29.35 kg/m  04/17/17 25.24 kg/m  04/15/17 25.24 kg/m   Wt Readings from Last 4 Encounters:  07/15/18 190 lb (86.2 kg)  04/03/18 193 lb (87.5 kg)  04/17/17 166 lb (75.3 kg)  04/15/17 166 lb (75.3 kg)  Psych/Mental status: Alert, oriented x 3 (person, place, & time)       Eyes: PERLA Respiratory: No evidence of acute respiratory distress  Cervical Spine Exam  Inspection: No masses, redness, or swelling Alignment: Symmetrical Functional ROM: Full ROM       Stability: No instability detected Muscle strength & Tone: Functionally intact Sensory: Unimpaired Palpation: No palpable anomalies              Upper Extremity (UE) Exam    Side: Right upper extremity  Side: Left upper extremity  Inspection: No masses, redness, swelling, or asymmetry. No contractures  Inspection: No masses, redness, swelling, or asymmetry. No contractures  Functional ROM: Unrestricted ROM          Functional ROM: Unrestricted ROM          Muscle strength & Tone: Functionally intact  Muscle strength & Tone: Functionally intact  Sensory: Unimpaired  Sensory: Unimpaired  Palpation: No palpable anomalies              Palpation: No palpable anomalies              Specialized Test(s): Deferred  Specialized Test(s): Deferred          Thoracic Spine Exam  Inspection: No masses, redness, or swelling Alignment: Symmetrical Functional ROM: Unrestricted ROM Stability: No instability detected Sensory: Unimpaired Muscle strength & Tone: No palpable anomalies  Lumbar Spine Exam  Inspection: No masses, redness, or swelling Alignment: Symmetrical Functional ROM: Unrestricted ROM      Stability: No instability detected Muscle strength & Tone: Functionally intact Sensory: Unimpaired Palpation: Complains of area being tender to palpation       Provocative Tests: Lumbar Hyperextension and rotation test: evaluation deferred today      Leg raise positive on the right less than 45 degrees  Patrick's Maneuver: Negative                    Gait & Posture Assessment  Ambulation: Unassisted Gait: Relatively normal for age and body habitus Posture: WNL   Lower Extremity Exam    Side: Right lower extremity  Side: Left lower extremity  Inspection: No masses, redness, swelling, or asymmetry. No contractures  Inspection: No masses, redness, swelling, or asymmetry. No contractures  Functional ROM: Unrestricted ROM          Functional ROM: Unrestricted ROM          Muscle strength &  Tone: Able to Toe-walk & Heel-walk without problems  Muscle strength & Tone: Able to Toe-walk & Heel-walk without problems  Sensory: Unimpaired  Sensory: Unimpaired  Palpation: No palpable anomalies  Palpation: No palpable anomalies   Assessment  Primary Diagnosis & Pertinent Problem List: The primary encounter diagnosis was Chronic neck pain (Primary Area of Pain) (R>L). Diagnoses of Chronic pain of both upper extremities, Chronic pain of right knee (Tertiary Area of Pain), Chronic bilateral low back pain with bilateral sciatica ([name]) (R>L), Chronic pain of both lower extremities (R>L), Pain in both hands (L>R), Chronic pain syndrome, Long term current use of opiate analgesic, Pharmacologic therapy, Disorder of skeletal system, Problems influencing health status, and Weakness of both lower extremities were also pertinent to this visit.  Visit Diagnosis: 1. Chronic neck pain (Primary Area of Pain) (R>L)   2. Chronic pain of both upper extremities   3. Chronic pain of right knee [facility] Area of Pain)   4. Chronic bilateral low back pain with bilateral sciatica ([name]) (R>L)   5. Chronic pain of both lower extremities (R>L)   6. Pain in both hands (L>R)   7. Chronic pain syndrome   8. Long term current use of opiate analgesic   9. Pharmacologic therapy   10. Disorder of skeletal system   11. Problems influencing health status   12. Weakness of both lower extremities    Plan of Care  Initial treatment plan:  Please be advised that as per protocol, today's visit has been an evaluation only. We have not taken over the patient's controlled substance management.  Problem-specific plan: No problem-specific Assessment & Plan notes found for this encounter.  Ordered Lab-work, Procedure(s), Referral(s), & Consult(s): Orders Placed This Encounter  Procedures  . DG Lumbar Spine Complete W/Bend  . DG Knee 1-2 Views Right  . Compliance Drug Analysis, Ur  . Comp.  Metabolic Panel (12)  . Magnesium  . Vitamin B12  . Sedimentation rate  . 25-Hydroxyvitamin D Lcms D2+D3  . C-reactive protein  . NCV with EMG(electromyography)   Pharmacotherapy: Medications ordered:  No orders of the defined types were placed in this encounter.  Medications administered during  this visit: Wende R. Balcerzak had no medications administered during this visit.   Pharmacotherapy under consideration:  Opioid Analgesics: The patient was informed that there is no guarantee that she would be a candidate for opioid analgesics. The decision will be made following CDC guidelines. This decision will be based on the results of diagnostic studies, as well as Ms. Basey's risk profile.  Membrane stabilizer: To be determined at a later time Muscle relaxant: To be determined at a later time NSAID: To be determined at a later time Other analgesic(s): To be determined at a later time   Interventional therapies under consideration: Ms. Sagrero was informed that there is no guarantee that she would be a candidate for interventional therapies. The decision will be based on the results of diagnostic studies, as well as Ms. Naff's risk profile.  Possible procedure(s): None Patient refused to have additional studies when clerical staff was trying to set this up.     Provider-requested follow-up: Return for 2nd Visit, w/ Dr. Dossie Arbour, xrays before return for second visit, EMG.  Future Appointments  Date Time Provider [place]  07/28/2018 10:30 AM Milinda Pointer, MD [facility] None    Primary Care Physician: Cletis Athens, MD Location: [facility] Outpatient Pain Management Facility Note by:  Date: 07/15/2018; Time: 3:43 PM  Pain Score Disclaimer: We use the NRS-11 scale. This is a self-reported, subjective measurement of pain severity with only modest accuracy. It is used primarily to identify changes within a particular patient. It must be understood that outpatient pain scales are  significantly less accurate that those used for research, where they can be applied under ideal controlled circumstances with minimal exposure to variables. In reality, the score is likely to be a combination of pain intensity and pain affect, where pain affect describes the degree of emotional arousal or changes in action readiness caused by the sensory experience of pain. Factors such as social and work situation, setting, emotional state, anxiety levels, expectation, and prior pain experience may influence pain perception and show large inter-individual differences that may also be affected by time variables.  Patient instructions provided during this appointment: Patient Instructions   ____________________________________________________________________________________________  Appointment Policy Summary  It is our goal and responsibility to provide the medical community with assistance in the evaluation and management of patients with chronic pain. Unfortunately our resources are limited. Because we do not have an unlimited amount of time, or available appointments, we are required to closely monitor and manage their use. The following rules exist to maximize their use:  Patient's responsibilities: 1. Punctuality:  At what time should I arrive? You should be physically present in our office 30 minutes before your scheduled appointment. Your scheduled appointment is with your assigned healthcare provider. However, it takes 5-10 minutes to be "checked-in", and another 15 minutes for the nurses to do the admission. If you arrive to our office at the time you were given for your appointment, you will end up being at least 20-25 minutes late to your appointment with the provider. 2. Tardiness:  What happens if I arrive only a few minutes after my scheduled appointment time? You will need to reschedule your appointment. The cutoff is your appointment time. This is why it is so important that you arrive  at least 30 minutes before that appointment. If you have an appointment scheduled for 10:00 AM and you arrive at 10:01, you will be required to reschedule your appointment.  3. Plan ahead:  Always assume that you will encounter  traffic on your way in. Plan for it. If you are dependent on a driver, make sure they understand these rules and the need to arrive early. 4. Other appointments and responsibilities:  Avoid scheduling any other appointments before or after your pain clinic appointments.  5. Be prepared:  Write down everything that you need to discuss with your healthcare provider and give this information to the admitting nurse. Write down the medications that you will need refilled. Bring your pills and bottles (even the empty ones), to all of your appointments, except for those where a procedure is scheduled. 6. No children or pets:  Find someone to take care of them. It is not appropriate to bring them in. 7. Scheduling changes:  We request "advanced notification" of any changes or cancellations. 8. Advanced notification:  Defined as a time period of more than 24 hours prior to the originally scheduled appointment. This allows for the appointment to be offered to other patients. 9. Rescheduling:  When a visit is rescheduled, it will require the cancellation of the original appointment. For this reason they both fall within the category of "Cancellations".  10. Cancellations:  They require advanced notification. Any cancellation less than 24 hours before the  appointment will be recorded as a "No Show". 11. No Show:  Defined as an unkept appointment where the patient failed to notify or declare to the practice their intention or inability to keep the appointment.  Corrective process for repeat offenders:  1. Tardiness: Three (3) episodes of rescheduling due to late arrivals will be recorded as one (1) "No Show". 2. Cancellation or reschedule: Three (3) cancellations or rescheduling  will be recorded as one (1) "No Show". 3. "No Shows": Three (3) "No Shows" within a 12 month period will result in discharge from the practice. ____________________________________________________________________________________________  ____________________________________________________________________________________________  Pain Scale  Introduction: The pain score used by this practice is the Verbal Numerical Rating Scale (VNRS-11). This is an 11-point scale. It is for adults and children 10 years or older. There are significant differences in how the pain score is reported, used, and applied. Forget everything you learned in the past and learn this scoring system.  General Information: The scale should reflect your current level of pain. Unless you are specifically asked for the level of your worst pain, or your average pain. If you are asked for one of these two, then it should be understood that it is over the past 24 hours.  Basic Activities of Daily Living (ADL): Personal hygiene, dressing, eating, transferring, and using restroom.  Instructions: Most patients tend to report their level of pain as a combination of two factors, their physical pain and their psychosocial pain. This last one is also known as "suffering" and it is reflection of how physical pain affects you socially and psychologically. From now on, report them separately. From this point on, when asked to report your pain level, report only your physical pain. Use the following table for reference.  Pain Clinic Pain Levels (0-5/10)  Pain Level Score  Description  No Pain 0   Mild pain 1 Nagging, annoying, but does not interfere with basic activities of daily living (ADL). Patients are able to eat, bathe, get dressed, toileting (being able to get on and off the toilet and perform personal hygiene functions), transfer (move in and out of bed or a chair without assistance), and maintain continence (able to control bladder and  bowel functions). Blood pressure and heart rate are unaffected. A normal  heart rate for a healthy adult ranges from 60 to 100 bpm (beats per minute).   Mild to moderate pain 2 Noticeable and distracting. Impossible to hide from other people. More frequent flare-ups. Still possible to adapt and function close to normal. It can be very annoying and may have occasional stronger flare-ups. With discipline, patients may get used to it and adapt.   Moderate pain 3 Interferes significantly with activities of daily living (ADL). It becomes difficult to feed, bathe, get dressed, get on and off the toilet or to perform personal hygiene functions. Difficult to get in and out of bed or a chair without assistance. Very distracting. With effort, it can be ignored when deeply involved in activities.   Moderately severe pain 4 Impossible to ignore for more than a few minutes. With effort, patients may still be able to manage work or participate in some social activities. Very difficult to concentrate. Signs of autonomic nervous system discharge are evident: dilated pupils (mydriasis); mild sweating (diaphoresis); sleep interference. Heart rate becomes elevated (>115 bpm). Diastolic blood pressure (lower number) rises above 100 mmHg. Patients find relief in laying down and not moving.   Severe pain 5 Intense and extremely unpleasant. Associated with frowning face and frequent crying. Pain overwhelms the senses.  Ability to do any activity or maintain social relationships becomes significantly limited. Conversation becomes difficult. Pacing back and forth is common, as getting into a comfortable position is nearly impossible. Pain wakes you up from deep sleep. Physical signs will be obvious: pupillary dilation; increased sweating; goosebumps; brisk reflexes; cold, clammy hands and feet; nausea, vomiting or dry heaves; loss of appetite; significant sleep disturbance with inability to fall asleep or to remain asleep. When  persistent, significant weight loss is observed due to the complete loss of appetite and sleep deprivation.  Blood pressure and heart rate becomes significantly elevated. Caution: If elevated blood pressure triggers a pounding headache associated with blurred vision, then the patient should immediately seek attention at an urgent or emergency care unit, as these may be signs of an impending stroke.    Emergency Department Pain Levels (6-10/10)  Emergency Room Pain 6 Severely limiting. Requires emergency care and should not be seen or managed at an outpatient pain management facility. Communication becomes difficult and requires great effort. Assistance to reach the emergency department may be required. Facial flushing and profuse sweating along with potentially dangerous increases in heart rate and blood pressure will be evident.   Distressing pain 7 Self-care is very difficult. Assistance is required to transport, or use restroom. Assistance to reach the emergency department will be required. Tasks requiring coordination, such as bathing and getting dressed become very difficult.   Disabling pain 8 Self-care is no longer possible. At this level, pain is disabling. The individual is unable to do even the most "basic" activities such as walking, eating, bathing, dressing, transferring to a bed, or toileting. Fine motor skills are lost. It is difficult to think clearly.   Incapacitating pain 9 Pain becomes incapacitating. Thought processing is no longer possible. Difficult to remember your own name. Control of movement and coordination are lost.   The worst pain imaginable 10 At this level, most patients pass out from pain. When this level is reached, collapse of the autonomic nervous system occurs, leading to a sudden drop in blood pressure and heart rate. This in turn results in a temporary and dramatic drop in blood flow to the brain, leading to a loss of consciousness. Fainting is  one of the body's  self defense mechanisms. Passing out puts the brain in a calmed state and causes it to shut down for a while, in order to begin the healing process.    Summary: 1. Refer to this scale when providing [place] with your pain level. 2. Be accurate and careful when reporting your pain level. This will help with your care. 3. Over-reporting your pain level will lead to loss of credibility. 4. Even a level of 1/10 means that there is pain and will be treated at our facility. 5. High, inaccurate reporting will be documented as "Symptom Exaggeration", leading to loss of credibility and suspicions of possible secondary gains such as obtaining more narcotics, or wanting to appear disabled, for fraudulent reasons. 6. Only pain levels of 5 or below will be seen at our facility. 7. Pain levels of 6 and above will be sent to the Emergency Department and the appointment cancelled. ____________________________________________________________________________________________

## 2018-07-20 LAB — COMP. METABOLIC PANEL (12)
A/G RATIO: 1.5 (ref 1.2–2.2)
ALBUMIN: 4.4 g/dL (ref 3.5–5.5)
AST: 22 IU/L (ref 0–40)
Alkaline Phosphatase: 87 IU/L (ref 39–117)
BUN / CREAT RATIO: 21 (ref 9–23)
BUN: 21 mg/dL (ref 6–24)
Bilirubin Total: <0.2 mg/dL (ref 0.0–1.2)
CHLORIDE: 104 mmol/L (ref 96–106)
Calcium: 9.4 mg/dL (ref 8.7–10.2)
Creatinine, Ser: 1.02 mg/dL — ABNORMAL HIGH (ref 0.57–1.00)
GFR calc Af Amer: 75 mL/min/{1.73_m2} (ref >59)
GFR calc non Af Amer: 65 mL/min/{1.73_m2} (ref >59)
GLOBULIN, TOTAL: 2.9 g/dL (ref 1.5–4.5)
Glucose: 97 mg/dL (ref 65–99)
POTASSIUM: 4.9 mmol/L (ref 3.5–5.2)
SODIUM: 139 mmol/L (ref 134–144)
TOTAL PROTEIN: 7.3 g/dL (ref 6.0–8.5)

## 2018-07-20 LAB — 25-HYDROXYVITAMIN D LCMS D2+D3: 25-HYDROXY, VITAMIN D: 36 ng/mL

## 2018-07-20 LAB — 25-HYDROXY VITAMIN D LCMS D2+D3
25-Hydroxy, Vitamin D-2: <1 ng/mL
25-Hydroxy, Vitamin D-3: 35 ng/mL

## 2018-07-20 LAB — COMPLIANCE DRUG ANALYSIS, UR

## 2018-07-20 LAB — C-REACTIVE PROTEIN

## 2018-07-20 LAB — MAGNESIUM: Magnesium: 2.2 mg/dL (ref 1.6–2.3)

## 2018-07-20 LAB — VITAMIN B12: Vitamin B-12: 706 pg/mL (ref 232–1245)

## 2018-07-20 LAB — SEDIMENTATION RATE: Sed Rate: 17 mm/hr (ref 0–32)

## 2018-07-25 ENCOUNTER — Other Ambulatory Visit: Payer: Self-pay

## 2018-07-25 ENCOUNTER — Emergency Department
Admission: EM | Admit: 2018-07-25 | Discharge: 2018-07-25 | Disposition: A | Discharge status: Home / Self Care | Payer: Medicare Other | Attending: Emergency Medicine | Admitting: Emergency Medicine

## 2018-07-25 ENCOUNTER — Emergency Department: Payer: Medicare Other

## 2018-07-25 DIAGNOSIS — R600 Localized edema: Secondary | ICD-10-CM | POA: Insufficient documentation

## 2018-07-25 DIAGNOSIS — Z79899 Other long term (current) drug therapy: Secondary | ICD-10-CM | POA: Diagnosis not present

## 2018-07-25 DIAGNOSIS — M25561 Pain in right knee: Secondary | ICD-10-CM | POA: Diagnosis present

## 2018-07-25 DIAGNOSIS — E039 Hypothyroidism, unspecified: Secondary | ICD-10-CM | POA: Diagnosis not present

## 2018-07-25 DIAGNOSIS — W010XXA Fall on same level from slipping, tripping and stumbling without subsequent striking against object, initial encounter: Secondary | ICD-10-CM | POA: Insufficient documentation

## 2018-07-25 DIAGNOSIS — M25461 Effusion, right knee: Secondary | ICD-10-CM

## 2018-07-25 DIAGNOSIS — F17210 Nicotine dependence, cigarettes, uncomplicated: Secondary | ICD-10-CM | POA: Insufficient documentation

## 2018-07-25 MED ORDER — OXYCODONE-ACETAMINOPHEN 5-325 MG PO TABS
1.0000 | ORAL_TABLET | Freq: Once | ORAL | Status: AC
  Administered 2018-07-25: 1 via ORAL
  Filled 2018-07-25: qty 1

## 2018-07-25 MED ORDER — OXYCODONE-ACETAMINOPHEN 7.5-325 MG PO TABS: 1.0000 | ORAL_TABLET | Freq: Four times a day (QID) | ORAL | 0 refills | Status: DC | PRN

## 2018-07-25 NOTE — Discharge Instructions (Addendum)
Ambulate with knee immobilizer and crutches until evaluation by orthopedics.

## 2018-07-25 NOTE — ED Triage Notes (Signed)
Pt comes via POV from home with c/o right knee pain/. Pt states swelling and pain. Pt states this started yesterday. Pt states she is unable to put much weight on leg.  Pt states recent injury about month ago and since then has had issues.

## 2018-07-25 NOTE — ED Notes (Signed)
Pt verbalized discharge instructions and has no questions at this time 

## 2018-07-25 NOTE — ED Provider Notes (Signed)
[facility] Emergency Department Provider Note   ____________________________________________   First MD Initiated Contact with Patient 07/25/18 1311     (approximate)  I have reviewed the triage vital signs and the nursing notes.   HISTORY  Chief Complaint Knee Pain    HPI Rosann Gorum is a 47 y.o. female patient presents with left knee pain and edema.  Patient states he fell on the knee 1 month ago.  Patient state pain resolved swelling and pain until a twisting incident yesterday while walking her dog.  Patient state pain increased with weightbearing.  Patient rates the pain 8/10.  Patient described the pain is "achy".  No palliative measures for complaint.   Past Medical History:  Diagnosis Date  . Allergy   . Anxiety   . Arthritis   . Bipolar 1 disorder (HCC)   . GERD (gastroesophageal reflux disease)   . Headache    migraines  . Personality disorder (HCC)   . Polysubstance abuse (HCC)   . Schizophrenia [facility])     Patient Active Problem List   Diagnosis Date Noted  . Chronic neck pain (Primary Area of Pain) (R>L) 07/15/2018  . Chronic upper extremity pain (Secondary Area of Pain) (R>L) 07/15/2018  . Chronic pain of right knee [facility] Area of Pain) 07/15/2018  . Chronic bilateral low back pain with bilateral sciatica ([name]) (R>L) 07/15/2018  . Chronic pain of both lower extremities (R>L) 07/15/2018  . Pain in both hands (L>R) 07/15/2018  . Chronic pain syndrome 07/15/2018  . Long term current use of opiate analgesic 07/15/2018  . Pharmacologic therapy 07/15/2018  . Disorder of skeletal system 07/15/2018  . Problems influencing health status 07/15/2018  . Lower extremity weakness 07/15/2018  . Arm numbness 04/21/2018  . Bilateral arm pain 04/21/2018  . Neck pain, bilateral 04/21/2018  . 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 Hypothyroidism 03/08/2017  . Opiate abuse, episodic (HCC) 07/22/2016  . PTSD  (post-traumatic stress disorder) 04/04/2016  . Tobacco use disorder 04/03/2016  . Bipolar 2 disorder, major depressive episode (HCC) 04/02/2016  . Cocaine use disorder, moderate, dependence (HCC) 04/02/2016  . Cannabis use disorder, moderate, dependence (HCC) 04/02/2016    Past Surgical History:  Procedure Laterality Date  . KNEE ARTHROSCOPY WITH PATELLA RECONSTRUCTION     left patella removed  . wrists Bilateral     Prior to Admission medications   Medication Sig Start Date End Date Taking? Authorizing Provider  atorvastatin (LIPITOR) 20 MG tablet Take 20 mg by mouth daily.    [provider]  baclofen (LIORESAL) 10 MG tablet Take 10 mg by mouth 3 (three) times daily.    [provider]  busPIRone (BUSPAR) 30 MG tablet Take 30 mg by mouth 3 (three) times daily.     [provider]  DULoxetine (CYMBALTA) 60 MG capsule Take 60 mg by mouth daily.     [provider]  gabapentin (NEURONTIN) 300 MG capsule Take 300 mg by mouth 3 (three) times daily.    [provider]  hydrOXYzine (ATARAX/VISTARIL) 50 MG tablet Take 50 mg by mouth 3 (three) times daily as needed.    [provider]  meloxicam (MOBIC) 15 MG tablet Take 15 mg by mouth daily.    [provider]  oxyCODONE-acetaminophen (PERCOCET) 7.5-325 MG tablet Take 1 tablet by mouth every 6 (six) hours as needed for severe pain. 07/25/18   Joni Reining, PA-C  ranitidine (ZANTAC) 150 MG capsule Take  150 mg by mouth 2 (two) times daily.    [provider]  traZODone (DESYREL) 100 MG tablet Take 1 tablet (100 mg total) by mouth at bedtime. 04/19/17   [name], MD    Allergies Aspirin; Flurbiprofen; and Nsaids  Family History  Problem Relation Age of Onset  . Heart disease Mother   . Hyperlipidemia Mother   . Hypertension Mother   . Kidney disease Mother   . Arthritis Mother   . Stroke Father   . Diabetes Father     Social History Social  History   Tobacco Use  . Smoking status: Current Every Day Smoker    Packs/day: 0.25    Types: Cigarettes  . Smokeless tobacco: Never Used  . Tobacco comment: Nicotine patch was ordered  Substance Use Topics  . Alcohol use: No  . Drug use: Yes    Types: Cocaine, Amphetamines, Marijuana    Comment: last 1 month ago    Review of Systems Constitutional: No fever/chills Eyes: No visual changes. ENT: No sore throat. Cardiovascular: Denies chest pain. Respiratory: Denies shortness of breath. Gastrointestinal: No abdominal pain.  No nausea, no vomiting.  No diarrhea.  No constipation. Genitourinary: Negative for dysuria. Musculoskeletal: Right knee pain. Skin: Negative for rash. Neurological: Negative for headaches, focal weakness or numbness. Psychiatric:Anxiety, bipolar, schizophrenia, polysubstance abuse. Allergic/Immunilogical: NSAIDs and aspirin.  ____________________________________________   PHYSICAL EXAM:  VITAL SIGNS: ED Triage Vitals [07/25/18 1313]  Enc Vitals Group     BP (!) 140/94     Pulse Rate 89     Resp 18     Temp 98 F (36.7 C)     Temp src      SpO2 97 %     Weight 190 lb (86.2 kg)     Height 5\' 8"  (1.727 m)     Head Circumference      Peak Flow      Pain Score 8     Pain Loc      Pain Edu?      Excl. in GC?    Constitutional: Alert and oriented. Well appearing and in no acute distress. Cardiovascular: Normal rate, regular rhythm. Grossly normal heart sounds.  Good peripheral circulation. Respiratory: Normal respiratory effort.  No retractions. Lungs CTAB. Musculoskeletal: No obvious deformity to the right knee.  Moderate edema.  Moderate joint effusions. Neurologic:  Normal speech and language. No gross focal neurologic deficits are appreciated. No gait instability. Skin:  Skin is warm, dry and intact. No rash noted. Psychiatric: Mood and affect are normal. Speech and behavior are normal.  ____________________________________________     LABS (all labs ordered are listed, but only abnormal results are displayed)  Labs Reviewed - No data to display ____________________________________________  EKG   ____________________________________________  RADIOLOGY  ED MD interpretation:    Official radiology report(s): Dg Knee Complete 4 Views Right  Result Date: 07/25/2018 CLINICAL DATA:  Fall 1 month ago with right knee pain. EXAM: RIGHT KNEE - COMPLETE 4+ VIEW COMPARISON:  None. FINDINGS: Joint effusion without fracture or malalignment. Mild degenerative marginal spurring. IMPRESSION: 1. Joint effusion without fracture. 2. Mild degenerative spurring. Electronically Signed   By: Marnee Spring M.D.   On: 07/25/2018 14:23    ____________________________________________   PROCEDURES  Procedure(s) performed: None  .Splint Application Date/Time: 07/25/2018 2:41 PM Performed by: Greig Castilla, NT Authorized by: Joni Reining, PA-C   Consent:    Consent obtained:  Verbal   Consent given by:  Patient  Risks discussed:  Numbness, swelling and pain Pre-procedure details:    Sensation:  Normal Procedure details:    Laterality:  Right   Location:  Knee   Knee:  R knee   Splint type:  Knee immobilizer Post-procedure details:    Pain:  Unchanged   Sensation:  Normal   Patient tolerance of procedure:  Tolerated well, no immediate complications    Critical Care performed: No  ____________________________________________   INITIAL IMPRESSION / ASSESSMENT AND PLAN / ED COURSE  As part of my medical decision making, I reviewed the following data within the electronic MEDICAL RECORD NUMBER    Right knee pain secondary to effusion.  Cannot rule out internal derangement.  Patient placed in the knee immobilizer and given crutches to assist with ambulation.  Patient advised to follow orthopedic for definitive evaluation and treatment.      ____________________________________________   FINAL CLINICAL  IMPRESSION(S) / ED DIAGNOSES  Final diagnoses:  Effusion of right knee joint     ED Discharge Orders         Ordered    oxyCODONE-acetaminophen (PERCOCET) 7.5-325 MG tablet  Every 6 hours PRN     07/25/18 1444           Note:  This document was prepared using Dragon voice recognition software and may include unintentional dictation errors.    Joni Reining, PA-C 07/25/18 1446    Jene Every, MD 07/25/18 [phone]

## 2018-07-27 NOTE — Progress Notes (Signed)
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 pain., Disp: 12 tablet, Rfl: 0 .  ranitidine (ZANTAC) 150 MG capsule, Take 150 mg by mouth 2 (two) times daily., Disp: , Rfl:  .  traZODone (DESYREL) 100 MG tablet, Take 1 tablet (100 mg total) by mouth at bedtime., Disp: 30 tablet, Rfl: 0  ROS  Constitutional: Denies any fever or chills Gastrointestinal: No reported hemesis, hematochezia, vomiting, or acute GI distress Musculoskeletal: Denies any acute onset joint swelling, redness, loss of ROM, or weakness Neurological: No reported episodes of acute onset apraxia, aphasia, dysarthria, agnosia, amnesia, paralysis, loss of coordination, or loss of consciousness  Allergies  Mercedes Forbes is allergic to aspirin; flurbiprofen; and nsaids.  [place]  Drug: Mercedes Forbes  reports that she has current or past drug history. Drugs: Cocaine, Amphetamines, and Marijuana. Alcohol:  reports that she does not drink alcohol. Tobacco:  reports that  she has been smoking cigarettes. She has been smoking about 0.25 packs per day. She has never used smokeless tobacco. Medical:  has a past medical history of Allergy, Anxiety, Arthritis, Bipolar 1 disorder ([place]), GERD (gastroesophageal reflux disease), Headache, Personality disorder ([place]), Polysubstance abuse ([place]), and Schizophrenia ([place]). Surgical: Mercedes Forbes  has a past surgical history that includes Knee arthroscopy with patella reconstruction and wrists (Bilateral). Family: family history includes Arthritis in her mother; Diabetes in her father; Heart disease in her mother; Hyperlipidemia in her mother; Hypertension in her mother; Kidney disease in her mother; Stroke in her father.  Constitutional Exam  General appearance: Well nourished, well developed, and well hydrated. In no apparent acute distress Vitals:   07/28/18 1043  BP: 134/80  Pulse: 100  Resp: 18  Temp: 98.4 F (36.9 C)  TempSrc: Oral  SpO2: 97%  Weight: 190 lb (86.2 kg)  Height: 5' 8" (1.727 m)   BMI Assessment: Estimated body mass index is 28.89 kg/m as calculated from the following:   Height as of this encounter: 5' 8" (1.727 m).   Weight as of this encounter: 190 lb (86.2 kg).  BMI interpretation table: BMI level Category Range association with higher incidence of chronic pain  <18 kg/m2 Underweight   18.5-24.9 kg/m2 Ideal body weight   25-29.9 kg/m2 Overweight Increased incidence by 20%  30-34.9 kg/m2 Obese (Class I) Increased incidence by 68%  35-39.9 kg/m2 Severe obesity (Class II) Increased incidence by 136%  >40 kg/m2 Extreme obesity (Class III) Increased incidence by 254%   Patient's current BMI Ideal Body weight  Body mass index is 28.89 kg/m. Ideal body weight: 63.9 kg (140 lb 14 oz) Adjusted ideal body weight: 72.8 kg (160 lb 8.4 oz)   BMI Readings from Last 4 Encounters:  07/28/18 28.89 kg/m  07/25/18 28.89 kg/m  07/15/18 28.89 kg/m  04/03/18 29.35 kg/m   Wt Readings from Last 4  Encounters:  07/28/18 190 lb (86.2 kg)  07/25/18 190 lb (86.2 kg)  07/15/18 190 lb (86.2 kg)  04/03/18 193 lb (87.5 kg)  Psych/Mental status: Alert, oriented x 3 (person, place, & time)       Eyes: PERLA Respiratory: No evidence of acute respiratory distress  Cervical Spine Area Exam  Skin & Axial Inspection: No masses, redness, edema, swelling, or associated skin lesions Alignment: Symmetrical Functional ROM: Decreased ROM      Stability: No instability detected Muscle Tone/Strength: Functionally intact. No obvious neuro-muscular anomalies detected. Sensory (Neurological): Movement-associated pain Palpation: No palpable anomalies              Upper Extremity (UE) Exam    Side: Right upper extremity  Side: Left upper extremity  Skin & Extremity Inspection: Skin color, temperature, and hair growth are WNL. No peripheral edema or cyanosis. No masses, redness, swelling, asymmetry, or associated skin lesions. No contractures.  Skin & Extremity Inspection: Skin color, temperature, and hair growth are WNL. No peripheral edema or cyanosis. No masses, redness, swelling, asymmetry, or associated skin lesions. No contractures.  Functional ROM: Unrestricted ROM          Functional ROM: Unrestricted ROM          Muscle Tone/Strength: Functionally intact. No obvious neuro-muscular anomalies detected.  Muscle Tone/Strength: Functionally intact. No obvious neuro-muscular anomalies detected.  Sensory (Neurological): Unimpaired          Sensory (Neurological): Unimpaired          Palpation: No palpable anomalies              Palpation: No palpable anomalies              Provocative Test(s):  Phalen's test: deferred Tinel's test: deferred Apley's scratch test (touch opposite shoulder):  Action 1 (Across chest): deferred Action 2 (Overhead): deferred Action 3 (LB reach): deferred   Provocative Test(s):  Phalen's test: deferred Tinel's test: deferred Apley's scratch test (touch opposite shoulder):   Action 1 (Across chest): deferred Action 2 (Overhead): deferred Action 3 (LB reach): deferred    Thoracic Spine Area Exam  Skin & Axial Inspection: No masses, redness, or swelling Alignment: Symmetrical Functional ROM: Unrestricted ROM Stability: No instability detected Muscle Tone/Strength: Functionally intact. No obvious neuro-muscular anomalies detected. Sensory (Neurological): Unimpaired Muscle strength & Tone: No palpable anomalies  Lumbar Spine Area Exam  Skin & Axial Inspection: No masses, redness, or swelling Alignment: Symmetrical Functional ROM: Decreased ROM       Stability: No instability detected Muscle Tone/Strength: Functionally intact. No obvious neuro-muscular anomalies detected. Sensory (Neurological): Unimpaired Palpation: Complains of area being tender to palpation       Provocative Tests: Hyperextension/rotation test: deferred today       Lumbar quadrant test (Kemp's test): deferred today       Lateral bending test: deferred today       Patrick's Maneuver: deferred today                   FABER test: deferred today                   S-I anterior distraction/compression test: deferred today         S-I lateral compression test: deferred today         S-I Thigh-thrust test: deferred today         S-I Gaenslen's test: deferred today          Gait & Posture Assessment  Ambulation: Limited  Gait: Relatively normal for age and body habitus Posture: Antalgic   Lower Extremity Exam    Side: Right lower extremity  Side: Left lower extremity  Stability: No instability observed          Stability: No instability observed          Skin & Extremity Inspection: Skin color, temperature, and hair growth are WNL. No peripheral edema or cyanosis. No masses, redness, swelling, asymmetry, or associated skin lesions. No contractures.  Skin & Extremity Inspection: Skin color, temperature, and hair growth are WNL. No peripheral edema or cyanosis. No masses, redness, swelling,  asymmetry, or associated skin lesions. No contractures.  Functional ROM: Unrestricted ROM                  Functional ROM: Unrestricted ROM                  Muscle Tone/Strength: Functionally intact. No obvious neuro-muscular anomalies detected.  Muscle Tone/Strength: Functionally intact. No obvious neuro-muscular anomalies detected.  Sensory (Neurological): Unimpaired  Sensory (Neurological): Unimpaired  Palpation: No palpable anomalies  Palpation: No palpable anomalies   Assessment & Plan  Primary Diagnosis & Pertinent Problem List: The primary encounter diagnosis was Chronic pain syndrome. 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  Visit Diagnosis: 1. Chronic pain syndrome   2. Chronic neck pain (Primary Area of Pain) (Bilateral) (R>L)   3. Chronic upper extremity pain (Secondary Area of Pain) (Bilateral) (R>L)   4. Chronic knee pain (Tertiary Area of Pain) (Right)   5. 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 Chronic lower extremity pain (Bilateral) (R>L)   7. Chronic hand pain (Bilateral) (L>R)   8. Disorder of skeletal system   9. Pharmacologic  therapy   10. Long term current use of opiate analgesic   11. Problems influencing health status   12. Cannabis use disorder, moderate, dependence ([place])   13. Cocaine use disorder, moderate, dependence ([place])   14. Amphetamine abuse ([place])   15. Opiate abuse, episodic ([place])   16. Tobacco use disorder   17. PTSD (post-traumatic stress disorder)   18. Bipolar 2 disorder, major depressive episode ([place])   19. 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   Procedure Orders     Cervical Epidural  Injection Lab Orders  No laboratory test(s) ordered today   Imaging Orders  No imaging studies ordered today   Referral Orders  No referral(s) requested today    Pharmacological management options:  Opioid Analgesics: I will not be prescribing any opioids at this time.  Today I had a long conversation with this patient on I explained to her that due to her prior history of amphetamine, cocaine, and marijuana use/abuse, she would be considered to be a very high risk for substance use disorder.  Because of this, it would be more appropriate to have her medications managed by either her psychiatrist or an addiction specialist. Membrane stabilizer: We have discussed the possibility of optimizing this mode of therapy, if tolerated Muscle relaxant: We have discussed the possibility of a trial NSAID: We have discussed the possibility  of a trial Other analgesic(s): To be determined at a later time   Interventional management options: Planned, scheduled, and/or pending:    Diagnostic right-sided cervical epidural steroid injection #1 under fluoroscopic guidance and IV sedation offered to the patient.   Considering:   None Patient refused to have additional studies when clerical staff was trying to set this up.    PRN Procedures:   None at this time   Provider-requested follow-up: Return if symptoms worsen or fail to improve, for PRN Procedure: (R) CESI #1.  No future appointments.  Primary Care Physician: Cletis Athens, MD Location: [facility] Note by: Gaspar Cola, MD Date: 07/28/2018; Time: 5:00 PM

## 2018-07-28 ENCOUNTER — Ambulatory Visit: Payer: Medicare Other | Attending: Nurse Practitioner | Admitting: Pain Medicine

## 2018-07-28 ENCOUNTER — Encounter: Payer: Self-pay | Admitting: Pain Medicine

## 2018-07-28 ENCOUNTER — Other Ambulatory Visit: Payer: Self-pay

## 2018-07-28 VITALS — BP 134/80 | HR 100 | Temp 98.4°F | Resp 18 | Ht 68.0 in | Wt 190.0 lb

## 2018-07-28 DIAGNOSIS — F1220 Cannabis dependence, uncomplicated: Secondary | ICD-10-CM | POA: Diagnosis not present

## 2018-07-28 DIAGNOSIS — M79601 Pain in right arm: Secondary | ICD-10-CM | POA: Diagnosis not present

## 2018-07-28 DIAGNOSIS — F17210 Nicotine dependence, cigarettes, uncomplicated: Secondary | ICD-10-CM | POA: Diagnosis not present

## 2018-07-28 DIAGNOSIS — M5441 Lumbago with sciatica, right side: Secondary | ICD-10-CM | POA: Diagnosis not present

## 2018-07-28 DIAGNOSIS — M50321 Other cervical disc degeneration at C4-C5 level: Secondary | ICD-10-CM | POA: Insufficient documentation

## 2018-07-28 DIAGNOSIS — M79604 Pain in right leg: Secondary | ICD-10-CM | POA: Diagnosis not present

## 2018-07-28 DIAGNOSIS — M79602 Pain in left arm: Secondary | ICD-10-CM

## 2018-07-28 DIAGNOSIS — M899 Disorder of bone, unspecified: Secondary | ICD-10-CM

## 2018-07-28 DIAGNOSIS — M79641 Pain in right hand: Secondary | ICD-10-CM | POA: Diagnosis not present

## 2018-07-28 DIAGNOSIS — K219 Gastro-esophageal reflux disease without esophagitis: Secondary | ICD-10-CM | POA: Diagnosis not present

## 2018-07-28 DIAGNOSIS — M79605 Pain in left leg: Secondary | ICD-10-CM | POA: Diagnosis not present

## 2018-07-28 DIAGNOSIS — M79642 Pain in left hand: Secondary | ICD-10-CM | POA: Insufficient documentation

## 2018-07-28 DIAGNOSIS — M25561 Pain in right knee: Secondary | ICD-10-CM | POA: Insufficient documentation

## 2018-07-28 DIAGNOSIS — M5030 Other cervical disc degeneration, unspecified cervical region: Secondary | ICD-10-CM | POA: Insufficient documentation

## 2018-07-28 DIAGNOSIS — M5442 Lumbago with sciatica, left side: Secondary | ICD-10-CM | POA: Diagnosis not present

## 2018-07-28 DIAGNOSIS — M542 Cervicalgia: Secondary | ICD-10-CM | POA: Diagnosis not present

## 2018-07-28 DIAGNOSIS — Z5181 Encounter for therapeutic drug level monitoring: Secondary | ICD-10-CM | POA: Diagnosis not present

## 2018-07-28 DIAGNOSIS — Z886 Allergy status to analgesic agent status: Secondary | ICD-10-CM | POA: Insufficient documentation

## 2018-07-28 DIAGNOSIS — F609 Personality disorder, unspecified: Secondary | ICD-10-CM | POA: Insufficient documentation

## 2018-07-28 DIAGNOSIS — Z79891 Long term (current) use of opiate analgesic: Secondary | ICD-10-CM

## 2018-07-28 DIAGNOSIS — F1510 Other stimulant abuse, uncomplicated: Secondary | ICD-10-CM | POA: Diagnosis not present

## 2018-07-28 DIAGNOSIS — Z791 Long term (current) use of non-steroidal anti-inflammatories (NSAID): Secondary | ICD-10-CM | POA: Insufficient documentation

## 2018-07-28 DIAGNOSIS — F209 Schizophrenia, unspecified: Secondary | ICD-10-CM | POA: Diagnosis not present

## 2018-07-28 DIAGNOSIS — F3181 Bipolar II disorder: Secondary | ICD-10-CM | POA: Insufficient documentation

## 2018-07-28 DIAGNOSIS — G894 Chronic pain syndrome: Secondary | ICD-10-CM | POA: Insufficient documentation

## 2018-07-28 DIAGNOSIS — Z888 Allergy status to other drugs, medicaments and biological substances status: Secondary | ICD-10-CM | POA: Insufficient documentation

## 2018-07-28 DIAGNOSIS — F1420 Cocaine dependence, uncomplicated: Secondary | ICD-10-CM | POA: Insufficient documentation

## 2018-07-28 DIAGNOSIS — E039 Hypothyroidism, unspecified: Secondary | ICD-10-CM | POA: Insufficient documentation

## 2018-07-28 DIAGNOSIS — Z79899 Other long term (current) drug therapy: Secondary | ICD-10-CM | POA: Insufficient documentation

## 2018-07-28 DIAGNOSIS — Z789 Other specified health status: Secondary | ICD-10-CM

## 2018-07-28 DIAGNOSIS — F17200 Nicotine dependence, unspecified, uncomplicated: Secondary | ICD-10-CM

## 2018-07-28 DIAGNOSIS — F4310 Post-traumatic stress disorder, unspecified: Secondary | ICD-10-CM | POA: Diagnosis not present

## 2018-07-28 DIAGNOSIS — F1110 Opioid abuse, uncomplicated: Secondary | ICD-10-CM

## 2018-07-28 DIAGNOSIS — G8929 Other chronic pain: Secondary | ICD-10-CM

## 2018-07-28 NOTE — Progress Notes (Signed)
Safety precautions to be maintained throughout the outpatient stay will include: orient to surroundings, keep bed in low position, maintain call bell within reach at all times, provide assistance with transfer out of bed and ambulation.  

## 2018-07-28 NOTE — Patient Instructions (Addendum)
____________________________________________________________________________________________  Preparing for Procedure with Sedation  Instructions: . Oral Intake: Do not eat or drink anything for at least 8 hours prior to your procedure. . Transportation: Public transportation is not allowed. Bring an adult driver. The driver must be physically present in our waiting room before any procedure can be started. . Physical Assistance: Bring an adult physically capable of assisting you, in the event you need help. This adult should keep you company at home for at least 6 hours after the procedure. . Blood Pressure Medicine: Take your blood pressure medicine with a sip of water the morning of the procedure. . Blood thinners: Notify our staff if you are taking any blood thinners. Depending on which one you take, there will be specific instructions on how and when to stop it. . Diabetics on insulin: Notify the staff so that you can be scheduled 1st case in the morning. If your diabetes requires high dose insulin, take only  of your normal insulin dose the morning of the procedure and notify the staff that you have done so. . Preventing infections: Shower with an antibacterial soap the morning of your procedure. . Build-up your immune system: Take 1000 mg of Vitamin C with every meal (3 times a day) the day prior to your procedure. . Antibiotics: Inform the staff if you have a condition or reason that requires you to take antibiotics before dental procedures. . Pregnancy: If you are pregnant, call and cancel the procedure. . Sickness: If you have a cold, fever, or any active infections, call and cancel the procedure. . Arrival: You must be in the facility at least 30 minutes prior to your scheduled procedure. . Children: Do not bring children with you. . Dress appropriately: Bring dark clothing that you would not mind if they get stained. . Valuables: Do not bring any jewelry or valuables.  Procedure  appointments are reserved for interventional treatments only. . No Prescription Refills. . No medication changes will be discussed during procedure appointments. . No disability issues will be discussed.  Reasons to call and reschedule or cancel your procedure: (Following these recommendations will minimize the risk of a serious complication.) . Surgeries: Avoid having procedures within 2 weeks of any surgery. (Avoid for 2 weeks before or after any surgery). . Flu Shots: Avoid having procedures within 2 weeks of a flu shots or . (Avoid for 2 weeks before or after immunizations). . Barium: Avoid having a procedure within 7-10 days after having had a radiological study involving the use of radiological contrast. (Myelograms, Barium swallow or enema study). . Heart attacks: Avoid any elective procedures or surgeries for the initial 6 months after a "Myocardial Infarction" (Heart Attack). . Blood thinners: It is imperative that you stop these medications before procedures. Let us know if you if you take any blood thinner.  . Infection: Avoid procedures during or within two weeks of an infection (including chest colds or gastrointestinal problems). Symptoms associated with infections include: Localized redness, fever, chills, night sweats or profuse sweating, burning sensation when voiding, cough, congestion, stuffiness, runny nose, sore throat, diarrhea, nausea, vomiting, cold or Flu symptoms, recent or current infections. It is specially important if the infection is over the area that we intend to treat. . Heart and lung problems: Symptoms that may suggest an active cardiopulmonary problem include: cough, chest pain, breathing difficulties or shortness of breath, dizziness, ankle swelling, uncontrolled high or unusually low blood pressure, and/or palpitations. If you are experiencing any of these symptoms, cancel   your procedure and contact your primary care physician for an evaluation.  Remember:   Regular Business hours are:  Monday to Thursday 8:00 AM to 4:00 PM  Provider's Schedule: Shayda Kalka, MD:  Procedure days: Tuesday and Thursday 7:30 AM to 4:00 PM  Bilal Lateef, MD:  Procedure days: Monday and Wednesday 7:30 AM to 4:00 PM ____________________________________________________________________________________________    ______________________________________________________________________________________________  Specialty Pain Scale  Introduction:  There are significant differences in how pain is reported. The word pain usually refers to physical pain, but it is also a common synonym of suffering. The medical community uses a scale from 0 (zero) to 10 (ten) to report pain level. Zero (0) is described as "no pain", while ten (10) is described as "the worse pain you can imagine". The problem with this scale is that physical pain is reported along with suffering. Suffering refers to mental pain, or more often yet it refers to any unpleasant feeling, emotion or aversion associated with the perception of harm or threat of harm. It is the psychological component of pain.  Pain Specialists prefer to separate the two components. The pain scale used by this practice is the Verbal Numerical Rating Scale (VNRS-11). This scale is for the physical pain only. DO NOT INCLUDE how your pain psychologically affects you. This scale is for adults 21 years of age and older. It has 11 (eleven) levels. The 1st level is 0/10. This means: "right now, I have no pain". In the context of pain management, it also means: "right now, my physical pain is under control with the current therapy".  General Information:  The scale should reflect your current level of pain. Unless you are specifically asked for the level of your worst pain, or your average pain. If you are asked for one of these two, then it should be understood that it is over the past 24 hours.  Levels 1 (one) through 5 (five) are  described below, and can be treated as an outpatient. Ambulatory pain management facilities such as ours are more than adequate to treat these levels. Levels 6 (six) through 10 (ten) are also described below, however, these must be treated as a hospitalized patient. While levels 6 (six) and 7 (seven) may be evaluated at an urgent care facility, levels 8 (eight) through 10 (ten) constitute medical emergencies and as such, they belong in a hospital's emergency department. When having these levels (as described below), do not come to our office. Our facility is not equipped to manage these levels. Go directly to an urgent care facility or an emergency department to be evaluated.  Definitions:  Activities of Daily Living (ADL): Activities of daily living (ADL or ADLs) is a term used in healthcare to refer to people's daily self-care activities. Health professionals often use a person's ability or inability to perform ADLs as a measurement of their functional status, particularly in regard to people post injury, with disabilities and the elderly. There are two ADL levels: Basic and Instrumental. Basic Activities of Daily Living (BADL  or BADLs) consist of self-care tasks that include: Bathing and showering; personal hygiene and grooming (including brushing/combing/styling hair); dressing; Toilet hygiene (getting to the toilet, cleaning oneself, and getting back up); eating and self-feeding (not including cooking or chewing and swallowing); functional mobility, often referred to as "transferring", as measured by the ability to walk, get in and out of bed, and get into and out of a chair; the broader definition (moving from one place to another while performing activities)   is useful for people with different physical abilities who are still able to get around independently. Basic ADLs include the things many people do when they get up in the morning and get ready to go out of the house: get out of bed, go to the  toilet, bathe, dress, groom, and eat. On the average, loss of function typically follows a particular order. Hygiene is the first to go, followed by loss of toilet use and locomotion. The last to go is the ability to eat. When there is only one remaining area in which the person is independent, there is a 62.9% chance that it is eating and only a 3.5% chance that it is hygiene. Instrumental Activities of Daily Living (IADL or IADLs) are not necessary for fundamental functioning, but they let an individual live independently in a community. IADL consist of tasks that include: cleaning and maintaining the house; home establishment and maintenance; care of others (including selecting and supervising caregivers); care of pets; child rearing; managing money; managing financials (investments, etc.); meal preparation and cleanup; shopping for groceries and necessities; moving within the community; safety procedures and emergency responses; health management and maintenance (taking prescribed medications); and using the telephone or other form of communication.  Instructions:  Most patients tend to report their pain as a combination of two factors, their physical pain and their psychosocial pain. This last one is also known as "suffering" and it is reflection of how physical pain affects you socially and psychologically. From now on, report them separately.  From this point on, when asked to report your pain level, report only your physical pain. Use the following table for reference.  Pain Clinic Pain Levels (0-5/10)  Pain Level Score  Description  No Pain 0   Mild pain 1 Nagging, annoying, but does not interfere with basic activities of daily living (ADL). Patients are able to eat, bathe, get dressed, toileting (being able to get on and off the toilet and perform personal hygiene functions), transfer (move in and out of bed or a chair without assistance), and maintain continence (able to control bladder and  bowel functions). Blood pressure and heart rate are unaffected. A normal heart rate for a healthy adult ranges from 60 to 100 bpm (beats per minute).   Mild to moderate pain 2 Noticeable and distracting. Impossible to hide from other people. More frequent flare-ups. Still possible to adapt and function close to normal. It can be very annoying and may have occasional stronger flare-ups. With discipline, patients may get used to it and adapt.   Moderate pain 3 Interferes significantly with activities of daily living (ADL). It becomes difficult to feed, bathe, get dressed, get on and off the toilet or to perform personal hygiene functions. Difficult to get in and out of bed or a chair without assistance. Very distracting. With effort, it can be ignored when deeply involved in activities.   Moderately severe pain 4 Impossible to ignore for more than a few minutes. With effort, patients may still be able to manage work or participate in some social activities. Very difficult to concentrate. Signs of autonomic nervous system discharge are evident: dilated pupils (mydriasis); mild sweating (diaphoresis); sleep interference. Heart rate becomes elevated (>115 bpm). Diastolic blood pressure (lower number) rises above 100 mmHg. Patients find relief in laying down and not moving.   Severe pain 5 Intense and extremely unpleasant. Associated with frowning face and frequent crying. Pain overwhelms the senses.  Ability to do any activity or maintain social   relationships becomes significantly limited. Conversation becomes difficult. Pacing back and forth is common, as getting into a comfortable position is nearly impossible. Pain wakes you up from deep sleep. Physical signs will be obvious: pupillary dilation; increased sweating; goosebumps; brisk reflexes; cold, clammy hands and feet; nausea, vomiting or dry heaves; loss of appetite; significant sleep disturbance with inability to fall asleep or to remain asleep. When  persistent, significant weight loss is observed due to the complete loss of appetite and sleep deprivation.  Blood pressure and heart rate becomes significantly elevated. Caution: If elevated blood pressure triggers a pounding headache associated with blurred vision, then the patient should immediately seek attention at an urgent or emergency care unit, as these may be signs of an impending stroke.    Emergency Department Pain Levels (6-10/10)  Emergency Room Pain 6 Severely limiting. Requires emergency care and should not be seen or managed at an outpatient pain management facility. Communication becomes difficult and requires great effort. Assistance to reach the emergency department may be required. Facial flushing and profuse sweating along with potentially dangerous increases in heart rate and blood pressure will be evident.   Distressing pain 7 Self-care is very difficult. Assistance is required to transport, or use restroom. Assistance to reach the emergency department will be required. Tasks requiring coordination, such as bathing and getting dressed become very difficult.   Disabling pain 8 Self-care is no longer possible. At this level, pain is disabling. The individual is unable to do even the most "basic" activities such as walking, eating, bathing, dressing, transferring to a bed, or toileting. Fine motor skills are lost. It is difficult to think clearly.   Incapacitating pain 9 Pain becomes incapacitating. Thought processing is no longer possible. Difficult to remember your own name. Control of movement and coordination are lost.   The worst pain imaginable 10 At this level, most patients pass out from pain. When this level is reached, collapse of the autonomic nervous system occurs, leading to a sudden drop in blood pressure and heart rate. This in turn results in a temporary and dramatic drop in blood flow to the brain, leading to a loss of consciousness. Fainting is one of the body's  self defense mechanisms. Passing out puts the brain in a calmed state and causes it to shut down for a while, in order to begin the healing process.    Summary: 1. Refer to this scale when providing [place] with your pain level. 2. Be accurate and careful when reporting your pain level. This will help with your care. 3. Over-reporting your pain level will lead to loss of credibility. 4. Even a level of 1/10 means that there is pain and will be treated at our facility. 5. High, inaccurate reporting will be documented as "Symptom Exaggeration", leading to loss of credibility and suspicions of possible secondary gains such as obtaining more narcotics, or wanting to appear disabled, for fraudulent reasons. 6. Only pain levels of 5 or below will be seen at our facility. 7. Pain levels of 6 and above will be sent to the Emergency Department and the appointment cancelled. ______________________________________________________________________________________________   ____________________________________________________________________________________________  Gabapentin Titration  Medication used: Gabapentin (Generic Name) or Neurontin (Brand Name) 300 mg tablets/capsules  Reasons to stop increasing the dose:  Reason 1: You get good relief of symptoms, in which case there is no need to increase the daily dose any further.    Reason 2: You develop some side effects, such as sleeping all of the time,  difficulty concentrating, or becoming disoriented, in which case you need to go down on the dose, to the prior level, where you were not experiencing any side effects. Stay on that dose longer, to allow more time for your body to get use it, before attempting to increase it again.   Reasons to stop increasing the dose: Reason 1: You get good relief of symptoms, in which case there is no need to increase the daily dose any further.  Reason 2: You develop some side effects, such as sleeping all of the time,  difficulty concentrating, or becoming disoriented, in which case you need to go down on the dose, to the prior level, where you were not experiencing any side effects. Stay on that dose longer, to allow more time for your body to get use it, before attempting to increase it again.  Steps to increase medication: Step 1: Start by taking 1 (one) tablet at bedtime x 7 (seven) days.  Step 2: After 7 (seven) days of taking 1 (one) tablet at bedtime, increase it to 2 (two) tablets at bedtime. Stay on this dose x 7 (seven) days.  Step 3: After 7 (seven) days of taking 2 (two) tablets at bedtime, increase it to 3 (three) tablets at bedtime. Stay on this dose x another 7 (seven) days.  Step 4: After 7 (seven) days of taking 3 (three) tablet at bedtime, begin taking 1 (one) tablet at noon with lunch. Stay on this dose x another 7 (seven) days.  Step 5: After 7 (seven) days of taking 3 (three) tablet at bedtime, and 1 (one) tablet at noon, then begin taking 1 (one) tablet in the afternoon with dinner. Stay on this dose x another 7 (seven) days.  Step 6: After 7 (seven) days of taking 3 (three) tablet at bedtime, 1 (one) tablet at noon, and 1 (one) tablet in the afternoon, then begin taking 1 (one) tablet in the morning with breakfast. Stay on this dose x another 7 (seven) days. At this point you should be taking the medicine 4 (four) times a day, or about every 6 (six) hours. This daily regimen of taking the medicine 4 (four) times a day, will be maintained from now on. You should not take any doses any sooner than every 6 (six) hours.  Step 7: After 7 (seven) days of taking 3 (three) tablet at bedtime, 1 (one) tablet at noon, 1 (one) tablet in the afternoon, and 1 (one) tablet in the morning, begin taking 2 (two) tablets at noon with lunch. Stay on this dose x another 7 (seven) days.   Step 8: After 7 (seven) days of taking 3 (three) tablet at bedtime, 2 (two) tablets at noon, 1 (one) tablet in the afternoon,  and 1 (one) tablet in the morning, begin taking 2 (two) tablets in the afternoon with dinner. Stay on this dose x another 7 (seven) days.   Step 9: After 7 (seven) days of taking 3 (three) tablet at bedtime, 2 (two) tablets at noon, 2 (two) tablets in the afternoon, and 1 (one) tablet in the morning, begin taking 2 (two) tablets in the morning with breakfast. Stay on this dose x another 7 (seven) days. At this point you should be taking the medicine 4 (four) times a day, or about every 6 (six) hours. This daily regimen of taking the medicine 4 (four) times a day, will be maintained from now on. You should not take any doses any sooner than every 6 (six)  hours.  Step 10: After 7 (seven) days of taking 3 (three) tablet at bedtime, 2 (two) tablets at noon, 2 (two) tablets in the afternoon, and 2 (two) tablets in the morning, begin taking 3 (three) tablets at noon with lunch. Stay on this dose x another 7 (seven) days.   Step 11: After 7 (seven) days of taking 3 (three) tablet at bedtime, 3 (three) tablets at noon, 2 (two) tablets in the afternoon, and 2 (two) tablets in the morning, begin taking 3 (three) tablets in the afternoon with dinner. Stay on this dose x another 7 (seven) days.   Step 12: After 7 (seven) days of taking 3 (three) tablet at bedtime, 3 (three) tablets at noon, 3 (three) tablets in the afternoon, and 2 (two) tablet in the morning, begin taking 3 (three) tablets in the morning with breakfast. Stay on this dose x another 7 (seven) days. At this point you should be taking the medicine 4 (four) times a day, or about every 6 (six) hours. This daily regimen of taking the medicine 4 (four) times a day, will be maintained from now on.   Endpoint: Once you have reached the maximum dose you can tolerate without side-effects, contact your physician so as to evaluate the results of the regimen.   Questions: Feel free to contact us for any questions or problems at [phone]  [phone] ____________________________________________________________________________________________  GENERAL RISKS AND COMPLICATIONS  What are the risk, side effects and possible complications? Generally speaking, most procedures are safe.  However, with any procedure there are risks, side effects, and the possibility of complications.  The risks and complications are dependent upon the sites that are lesioned, or the type of nerve block to be performed.  The closer the procedure is to the spine, the more serious the risks are.  Great care is taken when placing the radio frequency needles, block needles or lesioning probes, but sometimes complications can occur. 1. Infection: Any time there is an injection through the skin, there is a risk of infection.  This is why sterile conditions are used for these blocks.  There are four possible types of infection. 1. Localized skin infection. 2. Central Nervous System Infection-This can be in the form of Meningitis, which can be deadly. 3. Epidural Infections-This can be in the form of an epidural abscess, which can cause pressure inside of the spine, causing compression of the spinal cord with subsequent paralysis. This would require an emergency surgery to decompress, and there are no guarantees that the patient would recover from the paralysis. 4. Discitis-This is an infection of the intervertebral discs.  It occurs in about 1% of discography procedures.  It is difficult to treat and it may lead to surgery.        2. Pain: the needles have to go through skin and soft tissues, will cause soreness.       3. Damage to internal structures:  The nerves to be lesioned may be near blood vessels or    other nerves which can be potentially damaged.       4. Bleeding: Bleeding is more common if the patient is taking blood thinners such as  aspirin, Coumadin, Ticiid, Plavix, etc., or if he/she have some genetic predisposition  such as hemophilia. Bleeding into the  spinal canal can cause compression of the spinal  cord with subsequent paralysis.  This would require an emergency surgery to  decompress and there are no guarantees that the patient would recover from the  paralysis.       5. Pneumothorax:  Puncturing of a lung is a possibility, every time a needle is introduced in  the area of the chest or upper back.  Pneumothorax refers to free air around the  collapsed lung(s), inside of the thoracic cavity (chest cavity).  Another two possible  complications related to a similar event would include: Hemothorax and Chylothorax.   These are variations of the Pneumothorax, where instead of air around the collapsed  lung(s), you may have blood or chyle, respectively.       6. Spinal headaches: They may occur with any procedures in the area of the spine.       7. Persistent CSF (Cerebro-Spinal Fluid) leakage: This is a rare problem, but may occur  with prolonged intrathecal or epidural catheters either due to the formation of a fistulous  track or a dural tear.       8. Nerve damage: By working so close to the spinal cord, there is always a possibility of  nerve damage, which could be as serious as a permanent spinal cord injury with  paralysis.       9. Death:  Although rare, severe deadly allergic reactions known as "Anaphylactic  reaction" can occur to any of the medications used.      10. Worsening of the symptoms:  We can always make thing worse.  What are the chances of something like this happening? Chances of any of this occuring are extremely low.  By statistics, you have more of a chance of getting killed in a motor vehicle accident: while driving to the hospital than any of the above occurring .  Nevertheless, you should be aware that they are possibilities.  In general, it is similar to taking a shower.  Everybody knows that you can slip, hit your head and get killed.  Does that mean that you should not shower again?  Nevertheless always keep in mind that  statistics do not mean anything if you happen to be on the wrong side of them.  Even if a procedure has a 1 (one) in a 1,000,000 (million) chance of going wrong, it you happen to be that one..Also, keep in mind that by statistics, you have more of a chance of having something go wrong when taking medications.  Who should not have this procedure? If you are on a blood thinning medication (e.g. Coumadin, Plavix, see list of "Blood Thinners"), or if you have an active infection going on, you should not have the procedure.  If you are taking any blood thinners, please inform your physician.  How should I prepare for this procedure?  Do not eat or drink anything at least six hours prior to the procedure.  Bring a driver with you .  It cannot be a taxi.  Come accompanied by an adult that can drive you back, and that is strong enough to help you if your legs get weak or numb from the local anesthetic.  Take all of your medicines the morning of the procedure with just enough water to swallow them.  If you have diabetes, make sure that you are scheduled to have your procedure done first thing in the morning, whenever possible.  If you have diabetes, take only half of your insulin dose and notify our nurse that you have done so as soon as you arrive at the clinic.  If you are diabetic, but only take blood sugar pills (oral hypoglycemic), then do not take  them on the morning of your procedure.  You may take them after you have had the procedure.  Do not take aspirin or any aspirin-containing medications, at least eleven (11) days prior to the procedure.  They may prolong bleeding.  Wear loose fitting clothing that may be easy to take off and that you would not mind if it got stained with Betadine or blood.  Do not wear any jewelry or perfume  Remove any nail coloring.  It will interfere with some of our monitoring equipment.  NOTE: Remember that this is not meant to be interpreted as a complete  list of all possible complications.  Unforeseen problems may occur.  BLOOD THINNERS The following drugs contain aspirin or other products, which can cause increased bleeding during surgery and should not be taken for 2 weeks prior to and 1 week after surgery.  If you should need take something for relief of minor pain, you may take acetaminophen which is found in Tylenol,m Datril, Anacin-3 and Panadol. It is not blood thinner. The products listed below are.  Do not take any of the products listed below in addition to any listed on your instruction sheet.  A.P.C or A.P.C with Codeine Codeine Phosphate Capsules #3 Ibuprofen Ridaura  ABC compound Congesprin Imuran rimadil  Advil Cope Indocin Robaxisal  Alka-Seltzer Effervescent Pain Reliever and Antacid Coricidin or Coricidin-D  Indomethacin Rufen  Alka-Seltzer plus Cold Medicine Cosprin Ketoprofen S-A-C Tablets  Anacin Analgesic Tablets or Capsules Coumadin Korlgesic Salflex  Anacin Extra Strength Analgesic tablets or capsules CP-2 Tablets Lanoril Salicylate  Anaprox Cuprimine Capsules Levenox Salocol  Anexsia-D Dalteparin Magan Salsalate  Anodynos Darvon compound Magnesium Salicylate Sine-off  Ansaid Dasin Capsules Magsal Sodium Salicylate  Anturane Depen Capsules Marnal Soma  APF Arthritis pain formula Dewitt's Pills Measurin Stanback  Argesic Dia-Gesic Meclofenamic Sulfinpyrazone  Arthritis Bayer Timed Release Aspirin Diclofenac Meclomen Sulindac  Arthritis pain formula Anacin Dicumarol Medipren Supac  Analgesic (Safety coated) Arthralgen Diffunasal Mefanamic Suprofen  Arthritis Strength Bufferin Dihydrocodeine Mepro Compound Suprol  Arthropan liquid Dopirydamole Methcarbomol with Aspirin Synalgos  ASA tablets/Enseals Disalcid Micrainin Tagament  Ascriptin Doan's Midol Talwin  Ascriptin A/D Dolene Mobidin Tanderil  Ascriptin Extra Strength Dolobid Moblgesic Ticlid  Ascriptin with Codeine Doloprin or Doloprin with Codeine Momentum  Tolectin  Asperbuf Duoprin Mono-gesic Trendar  Aspergum Duradyne Motrin or Motrin IB Triminicin  Aspirin plain, buffered or enteric coated Durasal Myochrisine Trigesic  Aspirin Suppositories Easprin Nalfon Trillsate  Aspirin with Codeine Ecotrin Regular or Extra Strength Naprosyn Uracel  Atromid-S Efficin Naproxen Ursinus  Auranofin Capsules Elmiron Neocylate Vanquish  Axotal Emagrin Norgesic Verin  Azathioprine Empirin or Empirin with Codeine Normiflo Vitamin E  Azolid Emprazil Nuprin Voltaren  Bayer Aspirin plain, buffered or children's or timed BC Tablets or powders Encaprin Orgaran Warfarin Sodium  Buff-a-Comp Enoxaparin Orudis Zorpin  Buff-a-Comp with Codeine Equegesic Os-Cal-Gesic   Buffaprin Excedrin plain, buffered or Extra Strength Oxalid   Bufferin Arthritis Strength Feldene Oxphenbutazone   Bufferin plain or Extra Strength Feldene Capsules Oxycodone with Aspirin   Bufferin with Codeine Fenoprofen Fenoprofen Pabalate or Pabalate-SF   Buffets II Flogesic Panagesic   Buffinol plain or Extra Strength Florinal or Florinal with Codeine Panwarfarin   Buf-Tabs Flurbiprofen Penicillamine   Butalbital Compound Four-way cold tablets Penicillin   Butazolidin Fragmin Pepto-Bismol   Carbenicillin Geminisyn Percodan   Carna Arthritis Reliever Geopen Persantine   Carprofen Gold's salt Persistin   Chloramphenicol Goody's Phenylbutazone   Chloromycetin Haltrain Piroxlcam   Clmetidine heparin Plaquenil  Cllnoril Hyco-pap Ponstel   Clofibrate Hydroxy chloroquine Propoxyphen         Before stopping any of these medications, be sure to consult the physician who ordered them.  Some, such as Coumadin (Warfarin) are ordered to prevent or treat serious conditions such as "deep thrombosis", "pumonary embolisms", and other heart problems.  The amount of time that you may need off of the medication may also vary with the medication and the reason for which you were taking it.  If you are taking any  of these medications, please make sure you notify your pain physician before you undergo any procedures.         Pain Management Discharge Instructions  General Discharge Instructions :  If you need to reach your doctor call: Monday-Friday 8:00 am - 4:00 pm at [phone] or toll free [phone].  After clinic hours [phone] to have operator reach doctor.  Bring all of your medication bottles to all your appointments in the pain clinic.  To cancel or reschedule your appointment with Pain Management please remember to call 24 hours in advance to avoid a fee.  Refer to the educational materials which you have been given on: General Risks, I had my Procedure. Discharge Instructions, Post Sedation.  Post Procedure Instructions:  The drugs you were given will stay in your system until tomorrow, so for the next 24 hours you should not drive, make any legal decisions or drink any alcoholic beverages.  You may eat anything you prefer, but it is better to start with liquids then soups and crackers, and gradually work up to solid foods.  Please notify your doctor immediately if you have any unusual bleeding, trouble breathing or pain that is not related to your normal pain.  Depending on the type of procedure that was done, some parts of your body may feel week and/or numb.  This usually clears up by tonight or the next day.  Walk with the use of an assistive device or accompanied by an adult for the 24 hours.  You may use ice on the affected area for the first 24 hours.  Put ice in a Ziploc bag and cover with a towel and place against area 15 minutes on 15 minutes off.  You may switch to heat after 24 hours.Epidural Steroid Injection Patient Information  Description: The epidural space surrounds the nerves as they exit the spinal cord.  In some patients, the nerves can be compressed and inflamed by a bulging disc or a tight spinal canal (spinal stenosis).  By injecting steroids  into the epidural space, we can bring irritated nerves into direct contact with a potentially helpful medication.  These steroids act directly on the irritated nerves and can reduce swelling and inflammation which often leads to decreased pain.  Epidural steroids may be injected anywhere along the spine and from the neck to the low back depending upon the location of your pain.   After numbing the skin with local anesthetic (like Novocaine), a small needle is passed into the epidural space slowly.  You may experience a sensation of pressure while this is being done.  The entire block usually last less than 10 minutes.  Conditions which may be treated by epidural steroids:   Low back and leg pain  Neck and arm pain  Spinal stenosis  Post-laminectomy syndrome  Herpes zoster (shingles) pain  Pain from compression fractures  Preparation for the injection:  1. Do not eat any solid food or dairy products within  8 hours of your appointment.  2. You may drink clear liquids up to 3 hours before appointment.  Clear liquids include water, black coffee, juice or soda.  No milk or cream please. 3. You may take your regular medication, including pain medications, with a sip of water before your appointment  Diabetics should hold regular insulin (if taken separately) and take 1/2 normal NPH dos the morning of the procedure.  Carry some sugar containing items with you to your appointment. 4. A driver must accompany you and be prepared to drive you home after your procedure.  5. Bring all your current medications with your. 6. An IV may be inserted and sedation may be given at the discretion of the physician.   7. A blood pressure cuff, EKG and other monitors will often be applied during the procedure.  Some patients may need to have extra oxygen administered for a short period. 8. You will be asked to provide medical information, including your allergies, prior to the procedure.  We must know immediately  if you are taking blood thinners (like Coumadin/Warfarin)  Or if you are allergic to IV iodine contrast (dye). We must know if you could possible be pregnant.  Possible side-effects:  Bleeding from needle site  Infection (rare, may require surgery)  Nerve injury (rare)  Numbness & tingling (temporary)  Difficulty urinating (rare, temporary)  Spinal headache ( a headache worse with upright posture)  Light -headedness (temporary)  Pain at injection site (several days)  Decreased blood pressure (temporary)  Weakness in arm/leg (temporary)  Pressure sensation in back/neck (temporary)  Call if you experience:  Fever/chills associated with headache or increased back/neck pain.  Headache worsened by an upright position.  New onset weakness or numbness of an extremity below the injection site  Hives or difficulty breathing (go to the emergency room)  Inflammation or drainage at the infection site  Severe back/neck pain  Any new symptoms which are concerning to you  Please note:  Although the local anesthetic injected can often make your back or neck feel good for several hours after the injection, the pain will likely return.  It takes 3-7 days for steroids to work in the epidural space.  You may not notice any pain relief for at least that one week.  If effective, we will often do a series of three injections spaced 3-6 weeks apart to maximally decrease your pain.  After the initial series, we generally will wait several months before considering a repeat injection of the same type.  If you have any questions, please call [phone] [facility]

## 2018-07-30 ENCOUNTER — Other Ambulatory Visit: Payer: Self-pay | Admitting: Internal Medicine

## 2018-07-30 DIAGNOSIS — K7689 Other specified diseases of liver: Secondary | ICD-10-CM

## 2018-08-04 ENCOUNTER — Ambulatory Visit: Payer: Medicare Other

## 2018-08-05 ENCOUNTER — Other Ambulatory Visit: Payer: Self-pay

## 2018-08-05 ENCOUNTER — Encounter
Admission: RE | Admit: 2018-08-05 | Discharge: 2018-08-05 | Disposition: A | Discharge status: Home / Self Care | Payer: Medicare Other | Source: Ambulatory Visit | Attending: Neurosurgery | Admitting: Neurosurgery

## 2018-08-05 DIAGNOSIS — Z01818 Encounter for other preprocedural examination: Secondary | ICD-10-CM

## 2018-08-05 DIAGNOSIS — I4510 Unspecified right bundle-branch block: Secondary | ICD-10-CM

## 2018-08-05 HISTORY — DX: Major depressive disorder, single episode, unspecified: F32.9

## 2018-08-05 HISTORY — DX: Depression, unspecified: F32.A

## 2018-08-05 LAB — CBC
HCT: 44.9 % (ref 36.0–46.0)
HEMOGLOBIN: 15 g/dL (ref 12.0–15.0)
MCH: 29.6 pg (ref 26.0–34.0)
MCHC: 33.4 g/dL (ref 30.0–36.0)
MCV: 88.6 fL (ref 80.0–100.0)
PLATELETS: 280 10*3/uL (ref 150–400)
RBC: 5.07 MIL/uL (ref 3.87–5.11)
RDW: 13.7 % (ref 11.5–15.5)
WBC: 9.2 10*3/uL (ref 4.0–10.5)
nRBC: 0 % (ref 0.0–0.2)

## 2018-08-05 LAB — URINALYSIS, ROUTINE W REFLEX MICROSCOPIC
BILIRUBIN URINE: NEGATIVE
Bacteria, UA: NONE SEEN
GLUCOSE, UA: NEGATIVE mg/dL
HGB URINE DIPSTICK: NEGATIVE
KETONES UR: NEGATIVE mg/dL
NITRITE: NEGATIVE
PROTEIN: NEGATIVE mg/dL
Specific Gravity, Urine: 1.019 (ref 1.005–1.030)
pH: 6 (ref 5.0–8.0)

## 2018-08-05 LAB — TYPE AND SCREEN
ABO/RH(D): O POS
ANTIBODY SCREEN: NEGATIVE

## 2018-08-05 LAB — BASIC METABOLIC PANEL
Anion gap: 9 (ref 5–15)
BUN: 20 mg/dL (ref 6–20)
CALCIUM: 9.7 mg/dL (ref 8.9–10.3)
CHLORIDE: 101 mmol/L (ref 98–111)
CO2: 28 mmol/L (ref 22–32)
CREATININE: 1.03 mg/dL — AB (ref 0.44–1.00)
Glucose, Bld: 100 mg/dL — ABNORMAL HIGH (ref 70–99)
Potassium: 4.5 mmol/L (ref 3.5–5.1)
SODIUM: 138 mmol/L (ref 135–145)

## 2018-08-05 LAB — APTT: aPTT: 34 seconds (ref 24–36)

## 2018-08-05 LAB — SURGICAL PCR SCREEN
MRSA, PCR: NEGATIVE
Staphylococcus aureus: POSITIVE — AB

## 2018-08-05 LAB — PROTIME-INR
INR: 0.94
PROTHROMBIN TIME: 12.5 s (ref 11.4–15.2)

## 2018-08-05 NOTE — Patient Instructions (Signed)
Your procedure is scheduled on:08/06/18 ARRIVAL TIME 1200 NOON Report to Day Surgery. [facility] SECOND FLOOR .  Remember: Instructions that are not followed completely may result in serious medical risk,  up to and including death, or upon the discretion of your surgeon and anesthesiologist your  surgery may need to be rescheduled.     _X__ 1. Do not eat food after midnight the night before your procedure.                 No gum chewing or hard candies. You may drink clear liquids up to 2 hours                 before you are scheduled to arrive for your surgery- DO not drink clear                 liquids within 2 hours of the start of your surgery.                 Clear Liquids include:  water, apple juice without pulp, clear carbohydrate                 drink such as Clearfast of Gatorade, Black Coffee or Tea (Do not add                 anything to coffee or tea).  __X__2.  On the morning of surgery brush your teeth with toothpaste and water, you                may rinse your mouth with mouthwash if you wish.  Do not swallow any toothpaste of mouthwash.     _X__ 3.  No Alcohol for 24 hours before or after surgery.   _X__ 4.  Do Not Smoke or use e-cigarettes For 24 Hours Prior to Your Surgery.                 Do not use any chewable tobacco products for at least 6 hours prior to                 surgery.  ____  5.  Bring all medications with you on the day of surgery if instructed.   ____  6.  Notify your doctor if there is any change in your medical condition      (cold, fever, infections).     Do not wear jewelry, make-up, hairpins, clips or nail polish. Do not wear lotions, powders, or perfumes. You may wear deodorant. Do not shave 48 hours prior to surgery. Men may shave face and neck. Do not bring valuables to the hospital.    [facility] belongings or valuables.  Contacts, dentures or bridgework may not be worn into  surgery. Leave your suitcase in the car. After surgery it may be brought to your room. For patients admitted to the hospital, discharge time is determined by your treatment team.   Patients discharged the day of surgery will not be allowed to drive home.   Please read over the following fact sheets that you were given:   Surgical Site Infection Prevention / MRSA  _X___ Take these medicines the morning of surgery with A SIP OF WATER:    1. BACLOFEN  2. BUSPAR  3. DULOXETINE  4. GABAPENTIN  5. HYDROXYZINE  6. RANITIDINE TONIGHT AT BEDTIME AND IN THE MORNING   ____ Fleet Enema (as directed)   _X___ Use CHG Soap as directed  ____ Use inhalers on the day of surgery  ____ Stop metformin 2 days prior to surgery    ____ Take 1/2 of usual insulin dose the night before surgery. No insulin the morning          of surgery.   ____ Stop Coumadin/Plavix/aspirin on  ____ Stop Anti-inflammatories on   ____ Stop supplements until after surgery.    ____ Bring C-Pap to the hospital.

## 2018-08-05 NOTE — Pre-Procedure Instructions (Signed)
UA and MRSA results sent to Dr. Myer HaffYarbrough for review.

## 2018-08-06 ENCOUNTER — Encounter
Admission: RE | Disposition: A | Discharge status: Home / Self Care | Payer: Self-pay | Source: Ambulatory Visit | Attending: Neurosurgery

## 2018-08-06 ENCOUNTER — Inpatient Hospital Stay: Payer: Medicare Other | Admitting: Certified Registered"

## 2018-08-06 ENCOUNTER — Inpatient Hospital Stay
Admission: RE | Admit: 2018-08-06 | Discharge: 2018-08-08 | DRG: 472 | Disposition: A | Discharge status: Home / Self Care | Payer: Medicare Other | Source: Ambulatory Visit | Attending: Neurosurgery | Admitting: Neurosurgery

## 2018-08-06 ENCOUNTER — Inpatient Hospital Stay: Payer: Medicare Other

## 2018-08-06 ENCOUNTER — Other Ambulatory Visit: Payer: Self-pay

## 2018-08-06 DIAGNOSIS — F209 Schizophrenia, unspecified: Secondary | ICD-10-CM | POA: Diagnosis present

## 2018-08-06 DIAGNOSIS — M5412 Radiculopathy, cervical region: Secondary | ICD-10-CM | POA: Diagnosis present

## 2018-08-06 DIAGNOSIS — M4722 Other spondylosis with radiculopathy, cervical region: Principal | ICD-10-CM | POA: Diagnosis present

## 2018-08-06 DIAGNOSIS — Z808 Family history of malignant neoplasm of other organs or systems: Secondary | ICD-10-CM

## 2018-08-06 DIAGNOSIS — Z833 Family history of diabetes mellitus: Secondary | ICD-10-CM

## 2018-08-06 DIAGNOSIS — Z820 Family history of epilepsy and other diseases of the nervous system: Secondary | ICD-10-CM

## 2018-08-06 DIAGNOSIS — Z87891 Personal history of nicotine dependence: Secondary | ICD-10-CM

## 2018-08-06 DIAGNOSIS — E039 Hypothyroidism, unspecified: Secondary | ICD-10-CM | POA: Diagnosis present

## 2018-08-06 DIAGNOSIS — Z8349 Family history of other endocrine, nutritional and metabolic diseases: Secondary | ICD-10-CM

## 2018-08-06 DIAGNOSIS — Z419 Encounter for procedure for purposes other than remedying health state, unspecified: Secondary | ICD-10-CM

## 2018-08-06 DIAGNOSIS — Z8261 Family history of arthritis: Secondary | ICD-10-CM

## 2018-08-06 DIAGNOSIS — Z791 Long term (current) use of non-steroidal anti-inflammatories (NSAID): Secondary | ICD-10-CM

## 2018-08-06 DIAGNOSIS — Z79899 Other long term (current) drug therapy: Secondary | ICD-10-CM

## 2018-08-06 DIAGNOSIS — F3181 Bipolar II disorder: Secondary | ICD-10-CM | POA: Diagnosis present

## 2018-08-06 DIAGNOSIS — K219 Gastro-esophageal reflux disease without esophagitis: Secondary | ICD-10-CM | POA: Diagnosis present

## 2018-08-06 DIAGNOSIS — Z886 Allergy status to analgesic agent status: Secondary | ICD-10-CM

## 2018-08-06 DIAGNOSIS — G894 Chronic pain syndrome: Secondary | ICD-10-CM | POA: Diagnosis present

## 2018-08-06 DIAGNOSIS — G43909 Migraine, unspecified, not intractable, without status migrainosus: Secondary | ICD-10-CM | POA: Diagnosis present

## 2018-08-06 DIAGNOSIS — Z981 Arthrodesis status: Secondary | ICD-10-CM

## 2018-08-06 DIAGNOSIS — Z818 Family history of other mental and behavioral disorders: Secondary | ICD-10-CM

## 2018-08-06 HISTORY — PX: ANTERIOR CERVICAL DECOMPRESSION/DISCECTOMY FUSION 4 LEVELS: SHX5556

## 2018-08-06 LAB — URINE DRUG SCREEN, QUALITATIVE (ARMC ONLY)
Amphetamines, Ur Screen: POSITIVE — AB
BARBITURATES, UR SCREEN: NOT DETECTED
BENZODIAZEPINE, UR SCRN: NOT DETECTED
CANNABINOID 50 NG, UR ~~LOC~~: NOT DETECTED
Cocaine Metabolite,Ur ~~LOC~~: NOT DETECTED
MDMA (Ecstasy)Ur Screen: NOT DETECTED
METHADONE SCREEN, URINE: NOT DETECTED
OPIATE, UR SCREEN: NOT DETECTED
Phencyclidine (PCP) Ur S: NOT DETECTED
Tricyclic, Ur Screen: POSITIVE — AB

## 2018-08-06 LAB — POCT PREGNANCY, URINE: PREG TEST UR: NEGATIVE

## 2018-08-06 LAB — ABO/RH: ABO/RH(D): O POS

## 2018-08-06 SURGERY — ANTERIOR CERVICAL DECOMPRESSION/DISCECTOMY FUSION 4 LEVELS
Anesthesia: General | Site: Neck

## 2018-08-06 MED ORDER — DEXAMETHASONE SODIUM PHOSPHATE 10 MG/ML IJ SOLN
10.0000 mg | Freq: Once | INTRAMUSCULAR | Status: AC
  Administered 2018-08-07: 10 mg via INTRAVENOUS
  Filled 2018-08-06: qty 1

## 2018-08-06 MED ORDER — PROPOFOL 500 MG/50ML IV EMUL
INTRAVENOUS | Status: DC | PRN
  Administered 2018-08-06: 150 ug/kg/min via INTRAVENOUS

## 2018-08-06 MED ORDER — FENTANYL CITRATE (PF) 100 MCG/2ML IJ SOLN
INTRAMUSCULAR | Status: AC
  Filled 2018-08-06: qty 2

## 2018-08-06 MED ORDER — THROMBIN 5000 UNITS EX SOLR
CUTANEOUS | Status: AC
  Filled 2018-08-06: qty 5000

## 2018-08-06 MED ORDER — DEXAMETHASONE SODIUM PHOSPHATE 4 MG/ML IJ SOLN
4.0000 mg | Freq: Three times a day (TID) | INTRAMUSCULAR | Status: DC
  Administered 2018-08-06 – 2018-08-08 (×6): 4 mg via INTRAVENOUS
  Filled 2018-08-06 (×7): qty 1

## 2018-08-06 MED ORDER — REMIFENTANIL HCL 1 MG IV SOLR
INTRAVENOUS | Status: AC
  Filled 2018-08-06: qty 1000

## 2018-08-06 MED ORDER — ACETAMINOPHEN 325 MG PO TABS
650.0000 mg | ORAL_TABLET | ORAL | Status: DC | PRN
  Filled 2018-08-06: qty 2

## 2018-08-06 MED ORDER — CEFAZOLIN SODIUM-DEXTROSE 2-4 GM/100ML-% IV SOLN
INTRAVENOUS | Status: AC
  Filled 2018-08-06: qty 100

## 2018-08-06 MED ORDER — SODIUM CHLORIDE 0.9 % IV SOLN
INTRAVENOUS | Status: DC
  Administered 2018-08-06 (×2): via INTRAVENOUS

## 2018-08-06 MED ORDER — PROPOFOL 10 MG/ML IV BOLUS
INTRAVENOUS | Status: DC | PRN
  Administered 2018-08-06: 150 mg via INTRAVENOUS

## 2018-08-06 MED ORDER — FENTANYL CITRATE (PF) 100 MCG/2ML IJ SOLN
25.0000 ug | INTRAMUSCULAR | Status: DC | PRN
  Administered 2018-08-06 (×3): 50 ug via INTRAVENOUS

## 2018-08-06 MED ORDER — SUGAMMADEX SODIUM 200 MG/2ML IV SOLN
INTRAVENOUS | Status: DC | PRN
  Administered 2018-08-06: 175 mg via INTRAVENOUS

## 2018-08-06 MED ORDER — HYDROMORPHONE HCL 1 MG/ML IJ SOLN
INTRAMUSCULAR | Status: AC
  Administered 2018-08-06: 0.25 mg via INTRAVENOUS
  Filled 2018-08-06: qty 1

## 2018-08-06 MED ORDER — REMIFENTANIL HCL 1 MG IV SOLR
INTRAVENOUS | Status: DC | PRN
  Administered 2018-08-06: .2 ug/kg/min via INTRAVENOUS

## 2018-08-06 MED ORDER — HYDROXYZINE HCL 50 MG PO TABS
50.0000 mg | ORAL_TABLET | Freq: Three times a day (TID) | ORAL | Status: DC
  Administered 2018-08-06 – 2018-08-08 (×5): 50 mg via ORAL
  Filled 2018-08-06 (×7): qty 1

## 2018-08-06 MED ORDER — DEXAMETHASONE SODIUM PHOSPHATE 10 MG/ML IJ SOLN
INTRAMUSCULAR | Status: DC | PRN
  Administered 2018-08-06: 10 mg via INTRAVENOUS

## 2018-08-06 MED ORDER — SENNOSIDES-DOCUSATE SODIUM 8.6-50 MG PO TABS: 1.0000 | ORAL_TABLET | Freq: Every evening | ORAL | Status: DC | PRN

## 2018-08-06 MED ORDER — CEFAZOLIN SODIUM-DEXTROSE 2-4 GM/100ML-% IV SOLN
2.0000 g | Freq: Once | INTRAVENOUS | Status: AC
  Administered 2018-08-06: 2 g via INTRAVENOUS

## 2018-08-06 MED ORDER — LIDOCAINE HCL (PF) 2 % IJ SOLN
INTRAMUSCULAR | Status: AC
  Filled 2018-08-06: qty 10

## 2018-08-06 MED ORDER — SUCCINYLCHOLINE CHLORIDE 20 MG/ML IJ SOLN
INTRAMUSCULAR | Status: DC | PRN
  Administered 2018-08-06: 140 mg via INTRAVENOUS

## 2018-08-06 MED ORDER — HYDROMORPHONE HCL 1 MG/ML IJ SOLN
INTRAMUSCULAR | Status: AC
  Filled 2018-08-06: qty 1

## 2018-08-06 MED ORDER — FENTANYL CITRATE (PF) 100 MCG/2ML IJ SOLN
INTRAMUSCULAR | Status: DC | PRN
  Administered 2018-08-06 (×2): 50 ug via INTRAVENOUS

## 2018-08-06 MED ORDER — TRAZODONE HCL 100 MG PO TABS
100.0000 mg | ORAL_TABLET | Freq: Every day | ORAL | Status: DC
  Administered 2018-08-06 – 2018-08-07 (×2): 100 mg via ORAL
  Filled 2018-08-06 (×2): qty 1

## 2018-08-06 MED ORDER — SODIUM CHLORIDE 0.9 % IV SOLN: 250.0000 mL | INTRAVENOUS | Status: DC

## 2018-08-06 MED ORDER — PROPOFOL 10 MG/ML IV BOLUS
INTRAVENOUS | Status: AC
  Filled 2018-08-06: qty 20

## 2018-08-06 MED ORDER — ONDANSETRON HCL 4 MG PO TABS: 4.0000 mg | ORAL_TABLET | Freq: Four times a day (QID) | ORAL | Status: DC | PRN

## 2018-08-06 MED ORDER — LACTATED RINGERS IV SOLN
INTRAVENOUS | Status: DC | PRN
  Administered 2018-08-06: 13:00:00 via INTRAVENOUS

## 2018-08-06 MED ORDER — METHOCARBAMOL 500 MG PO TABS: 500.0000 mg | ORAL_TABLET | Freq: Four times a day (QID) | ORAL | Status: DC | PRN

## 2018-08-06 MED ORDER — HYDROMORPHONE HCL 1 MG/ML IJ SOLN
0.5000 mg | INTRAMUSCULAR | Status: DC | PRN
  Administered 2018-08-06: 0.5 mg via INTRAVENOUS
  Filled 2018-08-06: qty 1

## 2018-08-06 MED ORDER — BUSPIRONE HCL 10 MG PO TABS
30.0000 mg | ORAL_TABLET | Freq: Three times a day (TID) | ORAL | Status: DC
  Administered 2018-08-06 – 2018-08-08 (×5): 30 mg via ORAL
  Filled 2018-08-06 (×5): qty 3

## 2018-08-06 MED ORDER — SODIUM CHLORIDE 0.9% FLUSH: 3.0000 mL | INTRAVENOUS | Status: DC | PRN

## 2018-08-06 MED ORDER — PROMETHAZINE HCL 25 MG/ML IJ SOLN: 6.2500 mg | INTRAMUSCULAR | Status: DC | PRN

## 2018-08-06 MED ORDER — ACETAMINOPHEN 500 MG PO TABS
1000.0000 mg | ORAL_TABLET | Freq: Four times a day (QID) | ORAL | Status: AC
  Administered 2018-08-06 – 2018-08-07 (×4): 1000 mg via ORAL
  Filled 2018-08-06 (×4): qty 2

## 2018-08-06 MED ORDER — DEXMEDETOMIDINE HCL 200 MCG/2ML IV SOLN
INTRAVENOUS | Status: DC | PRN
  Administered 2018-08-06: 8 ug via INTRAVENOUS

## 2018-08-06 MED ORDER — THROMBIN 5000 UNITS EX SOLR
CUTANEOUS | Status: DC | PRN
  Administered 2018-08-06: 5000 [IU] via TOPICAL

## 2018-08-06 MED ORDER — METHOCARBAMOL 1000 MG/10ML IJ SOLN
500.0000 mg | Freq: Four times a day (QID) | INTRAVENOUS | Status: DC
  Administered 2018-08-06: 500 mg via INTRAVENOUS
  Filled 2018-08-06 (×8): qty 5

## 2018-08-06 MED ORDER — BUPIVACAINE-EPINEPHRINE (PF) 0.5% -1:200000 IJ SOLN
INTRAMUSCULAR | Status: DC | PRN
  Administered 2018-08-06: 6 mL

## 2018-08-06 MED ORDER — MEPERIDINE HCL 50 MG/ML IJ SOLN: 6.2500 mg | INTRAMUSCULAR | Status: DC | PRN

## 2018-08-06 MED ORDER — BACITRACIN 50000 UNITS IM SOLR
INTRAMUSCULAR | Status: AC
  Filled 2018-08-06: qty 1

## 2018-08-06 MED ORDER — MIDAZOLAM HCL 2 MG/2ML IJ SOLN
INTRAMUSCULAR | Status: DC | PRN
  Administered 2018-08-06: 2 mg via INTRAVENOUS

## 2018-08-06 MED ORDER — OXYCODONE HCL 5 MG PO TABS: 5.0000 mg | ORAL_TABLET | Freq: Once | ORAL | Status: DC | PRN

## 2018-08-06 MED ORDER — OXYCODONE HCL 5 MG/5ML PO SOLN: 5.0000 mg | Freq: Once | ORAL | Status: DC | PRN

## 2018-08-06 MED ORDER — DEXMEDETOMIDINE HCL IN NACL 200 MCG/50ML IV SOLN
INTRAVENOUS | Status: AC
  Filled 2018-08-06: qty 50

## 2018-08-06 MED ORDER — GABAPENTIN 300 MG PO CAPS
300.0000 mg | ORAL_CAPSULE | Freq: Three times a day (TID) | ORAL | Status: DC
  Administered 2018-08-06 – 2018-08-08 (×5): 300 mg via ORAL
  Filled 2018-08-06 (×5): qty 1

## 2018-08-06 MED ORDER — DULOXETINE HCL 60 MG PO CPEP
120.0000 mg | ORAL_CAPSULE | Freq: Every day | ORAL | Status: DC
  Administered 2018-08-07 – 2018-08-08 (×2): 120 mg via ORAL
  Filled 2018-08-06 (×2): qty 2

## 2018-08-06 MED ORDER — OXYCODONE HCL 5 MG PO TABS
5.0000 mg | ORAL_TABLET | ORAL | Status: DC | PRN
  Administered 2018-08-06: 5 mg via ORAL
  Filled 2018-08-06: qty 1

## 2018-08-06 MED ORDER — PROPOFOL 500 MG/50ML IV EMUL
INTRAVENOUS | Status: AC
  Filled 2018-08-06: qty 50

## 2018-08-06 MED ORDER — FAMOTIDINE 20 MG PO TABS: 10.0000 mg | ORAL_TABLET | Freq: Two times a day (BID) | ORAL | Status: DC | PRN

## 2018-08-06 MED ORDER — MIDAZOLAM HCL 2 MG/2ML IJ SOLN
INTRAMUSCULAR | Status: AC
  Filled 2018-08-06: qty 2

## 2018-08-06 MED ORDER — ROCURONIUM BROMIDE 50 MG/5ML IV SOLN
INTRAVENOUS | Status: AC
  Filled 2018-08-06: qty 1

## 2018-08-06 MED ORDER — PHENYLEPHRINE HCL 10 MG/ML IJ SOLN
INTRAMUSCULAR | Status: DC | PRN
  Administered 2018-08-06 (×2): 50 ug via INTRAVENOUS
  Administered 2018-08-06 (×2): 100 ug via INTRAVENOUS
  Administered 2018-08-06: 50 ug via INTRAVENOUS
  Administered 2018-08-06: 100 ug via INTRAVENOUS
  Administered 2018-08-06 (×3): 50 ug via INTRAVENOUS
  Administered 2018-08-06 (×2): 100 ug via INTRAVENOUS

## 2018-08-06 MED ORDER — FENTANYL CITRATE (PF) 100 MCG/2ML IJ SOLN
INTRAMUSCULAR | Status: AC
  Administered 2018-08-06: 50 ug via INTRAVENOUS
  Filled 2018-08-06: qty 2

## 2018-08-06 MED ORDER — PHENYLEPHRINE HCL 10 MG PO TABS
10.0000 mg | ORAL_TABLET | ORAL | Status: DC | PRN
  Filled 2018-08-06: qty 1

## 2018-08-06 MED ORDER — MENTHOL 3 MG MT LOZG
1.0000 | LOZENGE | OROMUCOSAL | Status: DC | PRN
  Filled 2018-08-06: qty 9

## 2018-08-06 MED ORDER — PHENOL 1.4 % MT LIQD
1.0000 | OROMUCOSAL | Status: DC | PRN
  Filled 2018-08-06: qty 177

## 2018-08-06 MED ORDER — METHOCARBAMOL 1000 MG/10ML IJ SOLN: 500.0000 mg | Freq: Four times a day (QID) | INTRAVENOUS | Status: DC | PRN

## 2018-08-06 MED ORDER — ONDANSETRON HCL 4 MG/2ML IJ SOLN
INTRAMUSCULAR | Status: DC | PRN
  Administered 2018-08-06: 4 mg via INTRAVENOUS

## 2018-08-06 MED ORDER — LIDOCAINE HCL (CARDIAC) PF 100 MG/5ML IV SOSY
PREFILLED_SYRINGE | INTRAVENOUS | Status: DC | PRN
  Administered 2018-08-06: 80 mg via INTRAVENOUS

## 2018-08-06 MED ORDER — DEXAMETHASONE SODIUM PHOSPHATE 10 MG/ML IJ SOLN
INTRAMUSCULAR | Status: AC
  Filled 2018-08-06: qty 1

## 2018-08-06 MED ORDER — METHOCARBAMOL 750 MG PO TABS
750.0000 mg | ORAL_TABLET | Freq: Four times a day (QID) | ORAL | Status: DC
  Administered 2018-08-06 – 2018-08-08 (×6): 750 mg via ORAL
  Filled 2018-08-06 (×8): qty 1

## 2018-08-06 MED ORDER — SODIUM CHLORIDE 0.9 % IV SOLN
INTRAVENOUS | Status: DC | PRN
  Administered 2018-08-06: 20 ug/min via INTRAVENOUS
  Administered 2018-08-06: 60 ug via INTRAVENOUS

## 2018-08-06 MED ORDER — HYDROMORPHONE HCL 1 MG/ML IJ SOLN
INTRAMUSCULAR | Status: DC | PRN
  Administered 2018-08-06: .25 mg via INTRAVENOUS

## 2018-08-06 MED ORDER — SODIUM CHLORIDE FLUSH 0.9 % IV SOLN
INTRAVENOUS | Status: AC
  Filled 2018-08-06: qty 10

## 2018-08-06 MED ORDER — ONDANSETRON HCL 4 MG/2ML IJ SOLN: 4.0000 mg | Freq: Four times a day (QID) | INTRAMUSCULAR | Status: DC | PRN

## 2018-08-06 MED ORDER — ROCURONIUM BROMIDE 100 MG/10ML IV SOLN
INTRAVENOUS | Status: DC | PRN
  Administered 2018-08-06: 5 mg via INTRAVENOUS
  Administered 2018-08-06: 15 mg via INTRAVENOUS
  Administered 2018-08-06: 10 mg via INTRAVENOUS
  Administered 2018-08-06: 20 mg via INTRAVENOUS
  Administered 2018-08-06: 10 mg via INTRAVENOUS

## 2018-08-06 MED ORDER — SODIUM CHLORIDE 0.9% FLUSH
3.0000 mL | Freq: Two times a day (BID) | INTRAVENOUS | Status: DC
  Administered 2018-08-07: 3 mL via INTRAVENOUS

## 2018-08-06 MED ORDER — ACETAMINOPHEN 500 MG PO TABS: 1000.0000 mg | ORAL_TABLET | Freq: Once | ORAL | Status: DC

## 2018-08-06 MED ORDER — ACETAMINOPHEN 650 MG RE SUPP: 650.0000 mg | RECTAL | Status: DC | PRN

## 2018-08-06 MED ORDER — BUPIVACAINE-EPINEPHRINE (PF) 0.5% -1:200000 IJ SOLN
INTRAMUSCULAR | Status: AC
  Filled 2018-08-06: qty 30

## 2018-08-06 MED ORDER — ONDANSETRON HCL 4 MG/2ML IJ SOLN
INTRAMUSCULAR | Status: AC
  Filled 2018-08-06: qty 2

## 2018-08-06 MED ORDER — PROPOFOL 10 MG/ML IV BOLUS
INTRAVENOUS | Status: AC
  Filled 2018-08-06: qty 40

## 2018-08-06 MED ORDER — OXYCODONE HCL 5 MG PO TABS
10.0000 mg | ORAL_TABLET | ORAL | Status: DC | PRN
  Administered 2018-08-07 – 2018-08-08 (×9): 10 mg via ORAL
  Filled 2018-08-06 (×9): qty 2

## 2018-08-06 MED ORDER — SODIUM CHLORIDE 0.9 % IR SOLN
Status: DC | PRN
  Administered 2018-08-06: 1000 mL

## 2018-08-06 MED ORDER — METHOCARBAMOL 500 MG PO TABS
500.0000 mg | ORAL_TABLET | Freq: Four times a day (QID) | ORAL | Status: DC
  Filled 2018-08-06: qty 1

## 2018-08-06 MED ORDER — EPHEDRINE SULFATE 50 MG/ML IJ SOLN
INTRAMUSCULAR | Status: DC | PRN
  Administered 2018-08-06 (×2): 5 mg via INTRAVENOUS

## 2018-08-06 MED ORDER — HYDROMORPHONE HCL 1 MG/ML IJ SOLN
0.2500 mg | INTRAMUSCULAR | Status: DC | PRN
  Administered 2018-08-06 (×5): 0.25 mg via INTRAVENOUS

## 2018-08-06 MED ORDER — METHOCARBAMOL 1000 MG/10ML IJ SOLN
500.0000 mg | Freq: Four times a day (QID) | INTRAVENOUS | Status: DC
  Filled 2018-08-06 (×8): qty 5

## 2018-08-06 MED ORDER — LACTATED RINGERS IV SOLN
INTRAVENOUS | Status: DC
  Administered 2018-08-06: 13:00:00 via INTRAVENOUS

## 2018-08-06 SURGICAL SUPPLY — 58 items
ALLOGRAFT LORDOTIC CC 7X11X14 (Bone Implant) ×12 IMPLANT
BULB RESERV EVAC DRAIN JP 100C (MISCELLANEOUS) IMPLANT
BUR NEURO DRILL SOFT 3.0X3.8M (BURR) ×3 IMPLANT
CANISTER SUCT 1200ML W/VALVE (MISCELLANEOUS) ×6 IMPLANT
CHLORAPREP W/TINT 26ML (MISCELLANEOUS) ×6 IMPLANT
COUNTER NEEDLE 20/40 LG (NEEDLE) ×3 IMPLANT
COVER LIGHT HANDLE STERIS (MISCELLANEOUS) ×6 IMPLANT
COVER WAND RF STERILE (DRAPES) ×3 IMPLANT
CRADLE LAMINECT ARM (MISCELLANEOUS) ×3 IMPLANT
DERMABOND ADVANCED (GAUZE/BANDAGES/DRESSINGS) ×2
DERMABOND ADVANCED .7 DNX12 (GAUZE/BANDAGES/DRESSINGS) ×1 IMPLANT
DRAIN CHANNEL JP 10F RND 20C F (MISCELLANEOUS) IMPLANT
DRAPE C-ARM 42X72 X-RAY (DRAPES) ×6 IMPLANT
DRAPE LAPAROTOMY 77X122 PED (DRAPES) ×3 IMPLANT
DRAPE MICROSCOPE SPINE 48X150 (DRAPES) ×3 IMPLANT
DRAPE POUCH INSTRU U-SHP 10X18 (DRAPES) ×3 IMPLANT
DRAPE SURG 17X11 SM STRL (DRAPES) ×12 IMPLANT
ELECT CAUTERY BLADE TIP 2.5 (TIP) ×3
ELECT REM PT RETURN 9FT ADLT (ELECTROSURGICAL) ×3
ELECTRODE CAUTERY BLDE TIP 2.5 (TIP) ×1 IMPLANT
ELECTRODE REM PT RTRN 9FT ADLT (ELECTROSURGICAL) ×1 IMPLANT
FEE INTRAOP MONITOR IMPULS NCS (MISCELLANEOUS) IMPLANT
FRAME EYE SHIELD (PROTECTIVE WEAR) ×6 IMPLANT
GLOVE BIOGEL PI IND STRL 7.0 (GLOVE) ×1 IMPLANT
GLOVE BIOGEL PI INDICATOR 7.0 (GLOVE) ×2
GLOVE SURG SYN 7.0 (GLOVE) ×6 IMPLANT
GLOVE SURG SYN 8.5  E (GLOVE) ×6
GLOVE SURG SYN 8.5 E (GLOVE) ×3 IMPLANT
GOWN SRG XL LVL 3 NONREINFORCE (GOWNS) ×1 IMPLANT
GOWN STRL NON-REIN TWL XL LVL3 (GOWNS) ×2
GOWN STRL REUS W/TWL MED LVL3 (GOWN DISPOSABLE) ×3 IMPLANT
GRADUATE 1200CC STRL 31836 (MISCELLANEOUS) ×3 IMPLANT
INTRAOP MONITOR FEE IMPULS NCS (MISCELLANEOUS)
INTRAOP MONITOR FEE IMPULSE (MISCELLANEOUS)
IV CATH ANGIO 12GX3 LT BLUE (NEEDLE) ×3 IMPLANT
KIT TURNOVER KIT A (KITS) ×3 IMPLANT
MARKER SKIN DUAL TIP RULER LAB (MISCELLANEOUS) ×6 IMPLANT
NEEDLE HYPO 22GX1.5 SAFETY (NEEDLE) ×3 IMPLANT
NS IRRIG 1000ML POUR BTL (IV SOLUTION) ×3 IMPLANT
PACK LAMINECTOMY NEURO (CUSTOM PROCEDURE TRAY) ×3 IMPLANT
PIN CASPAR 14 (PIN) ×1 IMPLANT
PIN CASPAR 14MM (PIN) ×3
PLATE ARCHON 4-LEVEL 74MM (Plate) ×3 IMPLANT
SCREW ARCHON SD VAR 4.0X15MM (Screw) ×21 IMPLANT
SCREW ARCHON ST VAR 4.5X15MM (Screw) ×9 IMPLANT
SPOGE SURGIFLO 8M (HEMOSTASIS) ×4
SPONGE KITTNER 5P (MISCELLANEOUS) ×6 IMPLANT
SPONGE SURGIFLO 8M (HEMOSTASIS) ×2 IMPLANT
SUT SILK 2 0 (SUTURE)
SUT SILK 2-0 18XBRD TIE 12 (SUTURE) IMPLANT
SUT V-LOC 90 ABS DVC 3-0 CL (SUTURE) ×3 IMPLANT
SUT VIC AB 3-0 SH 8-18 (SUTURE) ×3 IMPLANT
SYR 30ML LL (SYRINGE) ×3 IMPLANT
TAPE CLOTH 3X10 WHT NS LF (GAUZE/BANDAGES/DRESSINGS) ×3 IMPLANT
TOWEL OR 17X26 4PK STRL BLUE (TOWEL DISPOSABLE) ×12 IMPLANT
TRAY FOLEY MTR SLVR 16FR STAT (SET/KITS/TRAYS/PACK) IMPLANT
TUBING CONNECTING 10 (TUBING) ×2 IMPLANT
TUBING CONNECTING 10' (TUBING) ×1

## 2018-08-06 NOTE — Anesthesia Post-op Follow-up Note (Signed)
Anesthesia QCDR form completed.        

## 2018-08-06 NOTE — Progress Notes (Signed)
Pt s collar on stretcher

## 2018-08-06 NOTE — Op Note (Addendum)
Indications: Mercedes Forbes is a 47 yo female who presented with cervical radiculopathy due to extensive cervical spondylosis.  The patient failed conservative management and elected for surgical intervention.  Findings: extensive spondylosis  Preoperative Diagnosis: Cervical radiculopathy Postoperative Diagnosis: same   EBL: 75 ml IVF: 1600 ml Drains: none Disposition: Extubated and Stable to PACU Complications: none  No foley catheter was placed.   Preoperative Note:   Risks of surgery discussed include: infection, bleeding, stroke, coma, death, paralysis, CSF leak, nerve/spinal cord injury, numbness, tingling, weakness, complex regional pain syndrome, recurrent stenosis and/or disc herniation, vascular injury, development of instability, neck/back pain, need for further surgery, persistent symptoms, development of deformity, and the risks of anesthesia. The patient understood these risks and agreed to proceed.     Operative Procedure: 1. Anterior Cervical Discectomy and Fusion C3-4 including bilateral foraminotomies and end plate preparation  2. Anterior Cervical Discectomy and Fusion C4-5 including bilateral foraminotomies and end plate preparation  3. Anterior Cervical Discectomy and Fusion C5-6 including bilateral foraminotomies and end plate preparation  4. Anterior Cervical Discectomy and Fusion C6-7 including bilateral foraminotomies and end plate preparation 5. Anterior Spinal Instrumentation C3 to 7 6. Anterior arthrodesis from C3 to C7 7. Use of the operative microscope 8. Use of intraoperative flouroscopy  PROCEDURE IN DETAIL: After obtaining informed consent, the patient taken to the operating room, placed in supine position, general anesthesia induced.  The patient had a small shoulder roll placed behind their shoulders.  The patient received preop antibiotics and IV Decadron.  The patient had a neck incision outlined, was prepped and draped in usual sterile fashion. The  incision was injected with local anesthetic.   An incision was opened, dissection taken down medial to the carotid artery and jugular vein, lateral to the trachea and esophagus.  The prevertebral fascia identified and a localizing x-ray demonstrated the correct level.  The longus colli were dissected laterally, and self-retaining retractors placed to open the operative field. The microscope was then brought into the field.  With this complete, distractor pins were placed in the vertebral bodies of C3 and C5.  The distractor was placed from C3 to C5. The annuli at C3-4 and C4-5 were opened with a bovie. Curettes and pituitary rongeurs used to remove the majority of disk at each level, then the drill was used to remove the posterior osteophyte and begin the foraminotomies. The nerve hook was used to elevate the posterior longitudinal ligament, which was then removed with Kerrison rongeurs. The microblunt nerve hook could be passed out the foramen bilaterally at each level.   Meticulous hemostasis was obtained. After hemostasis, structural allograft was tapped behind the anterior lip of the vertebral body at C3-4 (7 mm height) and C4-5 (7 mm height).     The distractor was then removed, and the C3 caspar pin removed. Bone wax was used for hemostasis. An additional Caspar pin was placed at C7. The distractor was placed from C5-7, and the annuli at C5-6 and C6-7 were opened using a bovie.  Curettes and pituitary rongeurs used to remove the majority of disk, then the drill was used to remove the posterior osteophyte and begin the foraminotomies. The nerve hook was used to elevate the posterior longitudinal ligament, which was then removed with Kerrison rongeurs. The microblunt nerve hook could be passed out the foramen bilaterally.   Meticulous hemostasis was obtained.  Structural allograft coated with demineralized bone matrix was tapped behind the anterior lip of the vertebral body at C5-6 (  7 mm height) and C6-7 (7  mm height).  The caspar distractor was removed, and bone wax used for hemostasis at each level. The anterior osteophytes were removed.    A separate, five segment, four level plate (74 mm Nuvasive Archon) was chosen.  Two screws placed in the vertebral bodies of all segments except C5 (where 1 screw was placed on the right), respectively making sure the screws were behind the locking mechanism.  Final AP and lateral radiographs were taken.   With everything in good position, the wound was irrigated copiously with bacitracin-containing solution and meticulous hemostasis obtained.  Wound was closed in 2 layers using interrupted inverted 3-0 Vicryl sutures in the platysma and 3-0 monocryl in the dermis.  A drain was placed. The wound was dressed with dermabond, the head of bed at 30 degrees, taken to recovery room in stable condition.  No new postop neurological deficits were identified..  All counts were correct at the end of the case.   I performed the entire procedure with Ivar Drape PA as an Geophysicist/field seismologist.  Venetia Night MD

## 2018-08-06 NOTE — Progress Notes (Signed)
I was just notified that the patient is having difficulty with use of her right arm.  She can squeeze strongly, and has fully use of L arm and BLE.   To rule out hematoma, I have recommended MRI C spine.  I will determine need for any additional treatment after reviewing C spine MRI.  Venetia Nighthester Naesha Buckalew MD

## 2018-08-06 NOTE — Progress Notes (Signed)
Procedure: C3-C7 ACDF Procedure date 08/06/2018 Diagnosis cervical radiculopathy  History:  POD 0: Patient is here for C3-C7 ACDF for cervical radiculopathy.  Tolerated procedure well.  Evaluated in postop recovery, still disoriented from anesthesia.  Complains of 10/10 pain posterior cervical area. Unable to accurately determine if symptoms prior to surgery have resolved, but feels they have improved.   Physical Exam: Vitals:   08/06/18 1226 08/06/18 1704  BP: 104/74 (!) 117/96  Pulse: 70 (!) 103  Resp: 18 (!) 22  Temp:  98 F (36.7 C)  SpO2: 99% 98%    AA Ox3 Skin: Anterior cervical incision no active bleeding.  Glue intact.  Drain site intact.  No active bleeding from insertion point. Strength: unable to accurately assess -able to move all extremities without issue. Sensation: Unable to accurately assess  Data:  Recent Labs  Lab 08/05/18 1334  NA 138  K 4.5  CL 101  CO2 28  BUN 20  CREATININE 1.03*  GLUCOSE 100*  CALCIUM 9.7   No results for input(s): AST, ALT, ALKPHOS in the last 168 hours.  Invalid input(s): TBILI   Recent Labs  Lab 08/05/18 1334  WBC 9.2  HGB 15.0  HCT 44.9  PLT 280   Recent Labs  Lab 08/05/18 1334  APTT 34  INR 0.94         Other tests/results: Cervical x-rays pending  Assessment/Plan:  Mercedes Forbes POD 0 status post C3-C7 ACDF for cervical radiculopathy.  Patient tolerated procedure well.  Observe for pain control and monitor progress.  -Cervical brace - mobilize - pain control - DVT prophylaxis - PTOT  Ivar DrapeAmanda Mellany Dinsmore PA-C Department of Neurosurgery

## 2018-08-06 NOTE — Transfer of Care (Signed)
Immediate Anesthesia Transfer of Care Note  Patient: Mercedes MayhewKristen Rudd Forbes  Procedure(s) Performed: ANTERIOR CERVICAL DECOMPRESSION/DISCECTOMY FUSION 4 LEVELS-c3-7 (N/A Neck)  Patient Location: PACU  Anesthesia Type:General  Level of Consciousness: awake, alert  and oriented  Airway & Oxygen Therapy: Patient Spontanous Breathing and Patient connected to face mask oxygen  Post-op Assessment: Report given to RN and Post -op Vital signs reviewed and stable  Post vital signs: Reviewed and stable  Last Vitals:  Vitals Value Taken Time  BP 117/96 08/06/2018  5:04 PM  Temp 36.7 C 08/06/2018  5:04 PM  Pulse 98 08/06/2018  5:08 PM  Resp 24 08/06/2018  5:08 PM  SpO2 98 % 08/06/2018  5:08 PM  Vitals shown include unvalidated device data.  Last Pain:  Vitals:   08/06/18 1226  TempSrc: Temporal  PainSc: 0-No pain         Complications: No apparent anesthesia complications

## 2018-08-06 NOTE — Progress Notes (Signed)
Sr PEnwaarden in and aware of EKG results

## 2018-08-06 NOTE — Anesthesia Procedure Notes (Signed)
Procedure Name: Intubation Date/Time: 08/06/2018 1:12 PM Performed by: Mathews ArgyleLogan, Felicity Penix, CRNA Pre-anesthesia Checklist: Patient identified, Patient being monitored, Timeout performed, Emergency Drugs available and Suction available Patient Re-evaluated:Patient Re-evaluated prior to induction Oxygen Delivery Method: Circle system utilized Preoxygenation: Pre-oxygenation with 100% oxygen Induction Type: IV induction Ventilation: Mask ventilation without difficulty Laryngoscope Size: 3 and McGraph Grade View: Grade I Tube type: Oral Tube size: 7.0 mm Number of attempts: 1 Airway Equipment and Method: Stylet Placement Confirmation: ETT inserted through vocal cords under direct vision,  positive ETCO2 and breath sounds checked- equal and bilateral Secured at: 21 cm Tube secured with: Tape Dental Injury: Teeth and Oropharynx as per pre-operative assessment

## 2018-08-06 NOTE — Anesthesia Preprocedure Evaluation (Signed)
Anesthesia Evaluation  Patient identified by MRN, date of birth, ID band Patient awake    Reviewed: Allergy & Precautions, NPO status , Patient's Chart, lab work & pertinent test results  History of Anesthesia Complications Negative for: history of anesthetic complications  Airway Mallampati: II  TM Distance: >3 FB Neck ROM: Full    Dental  (+) Poor Dentition   Pulmonary neg sleep apnea, neg COPD, Current Smoker,    breath sounds clear to auscultation- rhonchi (-) wheezing      Cardiovascular Exercise Tolerance: Good (-) hypertension(-) CAD, (-) Past MI, (-) Cardiac Stents and (-) CABG  Rhythm:Regular Rate:Normal - Systolic murmurs and - Diastolic murmurs    Neuro/Psych  Headaches, PSYCHIATRIC DISORDERS Anxiety Depression Bipolar Disorder Schizophrenia    GI/Hepatic Neg liver ROS, GERD  ,  Endo/Other  neg diabetesHypothyroidism   Renal/GU negative Renal ROS     Musculoskeletal  (+) Arthritis ,   Abdominal (+) - obese,   Peds  Hematology negative hematology ROS (+)   Anesthesia Other Findings Past Medical History: No date: Allergy No date: Anxiety No date: Arthritis No date: Bipolar 1 disorder (HCC) No date: Depression No date: GERD (gastroesophageal reflux disease) No date: Headache     Comment:  migraines No date: Personality disorder (HCC) No date: Polysubstance abuse (HCC) No date: Schizophrenia (HCC)   Reproductive/Obstetrics                             Anesthesia Physical Anesthesia Plan  ASA: II  Anesthesia Plan: General   Post-op Pain Management:    Induction: Intravenous  PONV Risk Score and Plan: 1 and Ondansetron and Midazolam  Airway Management Planned: Oral ETT and Video Laryngoscope Planned  Additional Equipment:   Intra-op Plan:   Post-operative Plan: Extubation in OR  Informed Consent: I have reviewed the patients History and Physical, chart, labs and  discussed the procedure including the risks, benefits and alternatives for the proposed anesthesia with the patient or authorized representative who has indicated his/her understanding and acceptance.   Dental advisory given  Plan Discussed with: CRNA and Anesthesiologist  Anesthesia Plan Comments:         Anesthesia Quick Evaluation

## 2018-08-06 NOTE — H&P (Signed)
I have reviewed and confirmed my history and physical from 07/31/2018 with no additions or changes. Plan for ACDF C3-7.  Risks and benefits reviewed.  Heart sounds normal no MRG. Chest Clear to Auscultation Bilaterally.

## 2018-08-07 ENCOUNTER — Observation Stay: Payer: Medicare Other

## 2018-08-07 ENCOUNTER — Encounter: Payer: Self-pay | Admitting: Neurosurgery

## 2018-08-07 DIAGNOSIS — Z8261 Family history of arthritis: Secondary | ICD-10-CM | POA: Diagnosis not present

## 2018-08-07 DIAGNOSIS — Z79899 Other long term (current) drug therapy: Secondary | ICD-10-CM | POA: Diagnosis not present

## 2018-08-07 DIAGNOSIS — Z8349 Family history of other endocrine, nutritional and metabolic diseases: Secondary | ICD-10-CM | POA: Diagnosis not present

## 2018-08-07 DIAGNOSIS — G894 Chronic pain syndrome: Secondary | ICD-10-CM | POA: Diagnosis present

## 2018-08-07 DIAGNOSIS — E039 Hypothyroidism, unspecified: Secondary | ICD-10-CM | POA: Diagnosis present

## 2018-08-07 DIAGNOSIS — M542 Cervicalgia: Secondary | ICD-10-CM | POA: Diagnosis present

## 2018-08-07 DIAGNOSIS — F3181 Bipolar II disorder: Secondary | ICD-10-CM | POA: Diagnosis present

## 2018-08-07 DIAGNOSIS — K219 Gastro-esophageal reflux disease without esophagitis: Secondary | ICD-10-CM | POA: Diagnosis present

## 2018-08-07 DIAGNOSIS — G43909 Migraine, unspecified, not intractable, without status migrainosus: Secondary | ICD-10-CM | POA: Diagnosis present

## 2018-08-07 DIAGNOSIS — Z808 Family history of malignant neoplasm of other organs or systems: Secondary | ICD-10-CM | POA: Diagnosis not present

## 2018-08-07 DIAGNOSIS — Z886 Allergy status to analgesic agent status: Secondary | ICD-10-CM | POA: Diagnosis not present

## 2018-08-07 DIAGNOSIS — Z818 Family history of other mental and behavioral disorders: Secondary | ICD-10-CM | POA: Diagnosis not present

## 2018-08-07 DIAGNOSIS — F209 Schizophrenia, unspecified: Secondary | ICD-10-CM | POA: Diagnosis present

## 2018-08-07 DIAGNOSIS — Z791 Long term (current) use of non-steroidal anti-inflammatories (NSAID): Secondary | ICD-10-CM | POA: Diagnosis not present

## 2018-08-07 DIAGNOSIS — Z87891 Personal history of nicotine dependence: Secondary | ICD-10-CM | POA: Diagnosis not present

## 2018-08-07 DIAGNOSIS — Z833 Family history of diabetes mellitus: Secondary | ICD-10-CM | POA: Diagnosis not present

## 2018-08-07 DIAGNOSIS — M4722 Other spondylosis with radiculopathy, cervical region: Secondary | ICD-10-CM | POA: Diagnosis present

## 2018-08-07 DIAGNOSIS — Z820 Family history of epilepsy and other diseases of the nervous system: Secondary | ICD-10-CM | POA: Diagnosis not present

## 2018-08-07 NOTE — Care Management Note (Signed)
Case Management Note  Patient Details  Name: Mercedes Forbes MRN: [id number] Date of Birth: June 25, 1971  Subjective/Objective:                  RNCM met with patient to discuss discharge planning. She shares that she cannot move her right arm much post-operatively and it appears somewhat edematous.  She plans to return to home with her friend [name].  She is not sure if she will need PT/OT although I expect she will. She states she is ambulating independently to bathroom.   Action/Plan:   Home health list left with patient for review. RNCM will follow.   Expected Discharge Date:                  Expected Discharge Plan:     In-House Referral:     Discharge planning Services  CM Consult  Post Acute Care Choice:  Home Health Choice offered to:  Patient  DME Arranged:    DME Agency:     HH Arranged:    [facility] Agency:     Status of Service:  In process, will continue to follow  If discussed at Long Length of Stay Meetings, dates discussed:    Additional Comments:  Marshell Garfinkel, RN 08/07/2018, 9:13 AM

## 2018-08-07 NOTE — Progress Notes (Signed)
Per report patient having neck and R arm pain, RN unable to medicate prior to shift change. Upon initial assessment patient complaining of neck pain only. Medicated as ordered indicated. Pain relief felt. Patient requesting to eat. Able to advance to regular diet. Tolerated clear liquids and full liquids with no complications. Patient ambulated to bathroom with minimal assist the first time and stand by assist the second time with aide pushing IV pump. Patient using left hand to complete various activities such as eat, push remote and utilize phone. At approximately 2230 patient informed RN of inability to move right arm. Stated " I didn't say anything earlier because I thought it was because of the aneasthia".  Full sensation intact, ability to move fingers present and patient able to squeeze hand. Unable to raise arm, bend arm or hold arm in air for five seconds. Witnessed by 2nd RN. Physician notified. New orders placed. Physician notified of MRI results. Patient updated on plan of care. Verbalized understanding. Will continue to monitor.

## 2018-08-07 NOTE — Progress Notes (Addendum)
Procedure: C3-C7 ACDF Procedure date 08/06/2018 Diagnosis cervical radiculopathy  History:  POD1: She is doing well. Complains of posterior cervical pain currently rated 7-8/10 but hasn't received pain medication this AM. Pain 5/10 with pain medication. Continues to have right arm weakness, but states that it is quickly improving. Bilateral fingertip numbness persists following surgery. Arm pain has resolved. No new numbness or tingling.  She has ambulated, voided, and eaten without issue (minimal difficulty swallowing food). Drain output 150.    POD 0: Patient is here for C3-C7 ACDF for cervical radiculopathy.  Tolerated procedure well.  Evaluated in postop recovery, still disoriented from anesthesia.  Complains of 10/10 pain posterior cervical area. Unable to accurately determine if symptoms prior to surgery have resolved, but feels they have improved.   Physical Exam: Vitals:   08/06/18 1834 08/06/18 2326  BP: (!) 138/92 124/84  Pulse: 98 (!) 106  Resp: 20 18  Temp: (!) 97.5 F (36.4 C) (!) 97.4 F (36.3 C)  SpO2: 94% 92%    AA Ox3 Skin: Anterior cervical incision no active bleeding.  Glue intact.  Drain site intact.  No active bleeding from insertion point. Strength: 5/5 left upper extremity and lower extremities. 4+ right grip, 3 to 4-/5 right bicep, tricep, and deltoid.   Sensation: intact and symmetric upper and lower extremities.   Data:  Recent Labs  Lab 08/05/18 1334  NA 138  K 4.5  CL 101  CO2 28  BUN 20  CREATININE 1.03*  GLUCOSE 100*  CALCIUM 9.7   No results for input(s): AST, ALT, ALKPHOS in the last 168 hours.  Invalid input(s): TBILI   Recent Labs  Lab 08/05/18 1334  WBC 9.2  HGB 15.0  HCT 44.9  PLT 280   Recent Labs  Lab 08/05/18 1334  APTT 34  INR 0.94         Other tests/results:  EXAM: CERVICAL SPINE - 2-3 VIEW  COMPARISON:  MRI same day.  Operative imaging 08/06/2018  FINDINGS: ACDF from C3 to C7. Interbody spacers appear  well positioned. Anterior plate and screws appear well positioned. Soft tissue drain from the left remains in place. Airway appears intact. No abnormal or unexpected air or gas shadows.  IMPRESSION: No unexpected finding by radiography related to ACDF from C3-C7.  EXAM: MRI CERVICAL SPINE WITHOUT CONTRAST  TECHNIQUE: Multiplanar, multisequence MR imaging of the cervical spine was performed. No intravenous contrast was administered.  COMPARISON:  03/19/2018 cervical spine MRI.  FINDINGS: Alignment: Straightening of cervical lordosis without listhesis.  Vertebrae: C3-C7 ACDF. Mild stable edema within the superior T1 endplate with central Schmorl's node. Stable non expansile nonspecific T2 hyperintense lesion within the right C7 transverse process, probably a fat poor hemangioma. There is an additional similar stable lesion within the T3 vertebral body. No additional focus of abnormal bone marrow signal. No findings of discitis. No epidural hematoma. Susceptibility artifact partially obscures the vertebral bodies and the prevertebral soft tissues.  Cord: Normal signal and morphology.  Posterior Fossa, vertebral arteries, paraspinal tissues: Negative.  Disc levels:  C2-3: Stable disc bulge and right-sided uncovertebral/facet hypertrophy. Mild right foraminal stenosis. No canal stenosis.  C3-4: Discectomy and fusion. Stable posterior endplate marginal osteophytes, bilateral uncovertebral hypertrophy, and bilateral facet hypertrophy. Stable mild canal and moderate bilateral foraminal stenosis.  C4-5: Discectomy and fusion. Stable posterior endplate marginal osteophytes with right greater than left uncovertebral and facet hypertrophy. Severe right and mild left foraminal stenosis. No significant spinal canal stenosis.  C5-6: Discectomy and fusion.  Stable posterior endplate marginal osteophytes as well as left-greater-than-right uncovertebral and facet  hypertrophy. Severe right and moderate left foraminal stenosis. No significant canal stenosis.  C6-7: Discectomy and fusion. Stable left-greater-than-right uncovertebral and facet hypertrophy. Moderate left and mild right foraminal stenosis. No significant canal stenosis.  C7-T1: Stable mild disc bulge. No significant foraminal or canal stenosis.  IMPRESSION: 1. Interval C3-C7 ACDF. Improved patency of the cervical spinal canal. No findings of fracture, diskitis, epidural hematoma, cord compression, or discrete soft tissue collection. 2. Otherwise stable cervical spondylosis with prominent uncovertebral and facet hypertrophy resulting in neural foraminal stenosis at multiple levels. Findings are greatest at the C4-5 and C5-6 levels where there is severe right foraminal stenosis. Multilevel mild and moderate foraminal stenosis.  Assessment/Plan:  Mercedes MayhewKristen Forbes Forbes POD1 status post C3-C7 ACDF for cervical radiculopathy. Dr. Myer HaffYarbrough informed last night of right arm weakness and MRI was completed for further evaluation. Will discuss with him this AM.   No issues with ambulation, voiding, or eating. Patient is set up to receive [facility] health with [facility].   - continue to monitor drain output.  - mobilize - pain control - DVT prophylaxis - PTOT  Ivar DrapeAmanda Ferri PA-C Department of Neurosurgery   Please note that I reviewed the MRI findings last night, and spoke with the patient.  She has improved R arm function this morning compared to last night, but has R C5 palsy.  This is 2-3/5 currently.  She will likely improve with time.  We rounded again at 1730.  Drain output ~80 today. She will stay overnight for evaluation of her R arm weakness.  Venetia Nighthester Vaishali Baise MD

## 2018-08-07 NOTE — Evaluation (Signed)
Physical Therapy Evaluation Patient Details Name: Mercedes Forbes MRN: [id number] DOB: [birth year] Today's Date: 08/07/2018   History of Present Illness  47 y/o female s/p ACDF C3-7 on 11/21.  She has had significant R UE weakness since sx that is showing some improvement.  Clinical Impression  Pt did well with all functional mobility tasks (negotiated up/down steps, ambulated >200 ft, good balance, etc).  She is still having residual post-op R UE weakness, most dramatically limited in the shoulder.  She reports that it is much improved since last night and that her drain has been draining significantly.  Pt is safe to go home with assist from friend from a PT perspective, though she will still need work with R UE strength, etc.    Follow Up Recommendations Follow surgeon's recommendation for DC plan and follow-up therapies    Equipment Recommendations  None recommended by PT    Recommendations for Other Services       Precautions / Restrictions Precautions Precautions: Cervical Required Braces or Orthoses: Cervical Brace Cervical Brace: ([place]) Restrictions Weight Bearing Restrictions: No      Mobility  Bed Mobility Overal bed mobility: Independent             General bed mobility comments: Pt easily gets to EOB w/o assist  Transfers Overall transfer level: Independent Equipment used: None             General transfer comment: Pt able to rise to standing w/o assist, able to maintain balance w/o issues  Ambulation/Gait Ambulation/Gait assistance: Modified independent (Device/Increase time) Gait Distance (Feet): 250 Feet Assistive device: None       General Gait Details: Pt did well with ambulation showing good confidence and safety.  Able to accomodate not being able to turn neck well, no safety issues.  Stairs Stairs: Yes Stairs assistance: Modified independent (Device/Increase time) Stair Management: No rails Number of Stairs: 4 General stair  comments: Pt able to negotiate steps w/o UEs/rails and no safety issues apart from difficulty looking down due to neck brace  Wheelchair Mobility    Modified Rankin (Stroke Patients Only)       Balance Overall balance assessment: Independent                                           Pertinent Vitals/Pain Pain Assessment: 0-10 Pain Score: 8  Pain Location: neck    Home Living Family/patient expects to be discharged to:: Private residence Living Arrangements: Alone Available Help at Discharge: Friend(s) Type of Home: House Home Access: Stairs to enter Entrance Stairs-Rails: None Entrance Stairs-Number of Steps: 2 Home Layout: One level        Prior Function Level of Independence: Independent         Comments: Pt with some R UE weakness pre surgery, but fully independent, active (does not drive, no car)     Hand Dominance        Extremity/Trunk Assessment   Upper Extremity Assessment Upper Extremity Assessment: RUE deficits/detail RUE Deficits / Details: no AROM in R shoulder, able to show 2/5 strength.  Elbow extension and wrist supination, grip 3+ to 4-/5, elbow flexion and pronation 3/5, grip     Lower Extremity Assessment Lower Extremity Assessment: Overall WFL for tasks assessed       Communication   Communication: No difficulties  Cognition Arousal/Alertness: Awake/alert Behavior During Therapy: WFL for tasks  assessed/performed Overall Cognitive Status: Within Functional Limits for tasks assessed                                        General Comments      Exercises General Exercises - Upper Extremity Shoulder Flexion: AAROM;10 reps Elbow Flexion: AROM;10 reps Elbow Extension: Strengthening;10 reps Wrist Flexion: (resisted wrist pronation and AAROM supination x 10)   Assessment/Plan    PT Assessment Patient needs continued PT services  PT Problem List Decreased range of motion;Decreased  strength;Decreased activity tolerance;Decreased coordination;Decreased safety awareness;Pain       PT Treatment Interventions Gait training;Functional mobility training;Therapeutic exercise;Therapeutic activities;Neuromuscular re-education;Patient/family education;Stair training    PT Goals (Current goals can be found in the Care Plan section)  Acute Rehab PT Goals Patient Stated Goal: get R UE strength back PT Goal Formulation: With patient Time For Goal Achievement: 08/21/18 Potential to Achieve Goals: Good    Frequency 7X/week   Barriers to discharge        Co-evaluation               AM-PAC PT "6 Clicks" Daily Activity  Outcome Measure Difficulty turning over in bed (including adjusting bedclothes, sheets and blankets)?: None Difficulty moving from lying on back to sitting on the side of the bed? : None Difficulty sitting down on and standing up from a chair with arms (e.g., wheelchair, bedside commode, etc,.)?: None Help needed moving to and from a bed to chair (including a wheelchair)?: None Help needed walking in hospital room?: None Help needed climbing 3-5 steps with a railing? : None 6 Click Score: 24    End of Session Equipment Utilized During Treatment: Gait belt Activity Tolerance: Patient tolerated treatment well Patient left: with bed alarm set;with call bell/phone within reach;with SCD's reapplied Nurse Communication: Patient requests pain meds PT Visit Diagnosis: Difficulty in walking, not elsewhere classified (R26.2);Muscle weakness (generalized) (M62.81)    Time: [id number] PT Time Calculation (min) (ACUTE ONLY): 27 min   Charges:   PT Evaluation $PT Eval Low Complexity: 1 Low PT Treatments $Therapeutic Exercise: 8-22 mins        Malachi ProGalen R Williemae Muriel, DPT 08/07/2018, 1:51 PM

## 2018-08-07 NOTE — Progress Notes (Signed)
At 0300 patient able to lift arm slightly off bed at approximately 20 degrees.Unable to bend arm. Will continue to monitor.

## 2018-08-07 NOTE — Care Management Obs Status (Signed)
MEDICARE OBSERVATION STATUS NOTIFICATION   Patient Details  Name: Etheleen MayhewKristen Rudd Encarnacion MRN: [id number] Date of Birth: 1971/04/01   Medicare Observation Status Notification Given:  Yes  Patient was not able to sign from this PC. Copy delivered and explained to patient.   Collie SiadAngela Cylah Fannin, RN 08/07/2018, 9:04 AM

## 2018-08-08 MED ORDER — OXYCODONE HCL 5 MG PO TABS: 5.0000 mg | ORAL_TABLET | ORAL | 0 refills | Status: DC | PRN

## 2018-08-08 MED ORDER — METHOCARBAMOL 750 MG PO TABS: 750.0000 mg | ORAL_TABLET | Freq: Four times a day (QID) | ORAL | 0 refills | Status: DC

## 2018-08-08 NOTE — Plan of Care (Signed)
  Problem: Education: Goal: Knowledge of General Education information will improve Description Including pain rating scale, medication(s)/side effects and non-pharmacologic comfort measures Outcome: Adequate for Discharge   Problem: Health Behavior/Discharge Planning: Goal: Ability to manage health-related needs will improve Outcome: Adequate for Discharge   Problem: Clinical Measurements: Goal: Will remain free from infection Outcome: Adequate for Discharge   Problem: Activity: Goal: Risk for activity intolerance will decrease Outcome: Adequate for Discharge   Problem: Pain Managment: Goal: General experience of comfort will improve Outcome: Adequate for Discharge   Problem: Skin Integrity: Goal: Risk for impaired skin integrity will decrease Outcome: Adequate for Discharge

## 2018-08-08 NOTE — Progress Notes (Signed)
Patient ready for discharge home with family. Reviewed all discharge instructions including prescriptions and f/u appointments.

## 2018-08-08 NOTE — Discharge Summary (Signed)
Procedure: C3-C7 ACDF Procedure date 08/06/2018 Diagnosis cervical radiculopathy  History:  POD2: Pain rated 6-7/10. Right arm weakness continues but she still feels that it is improving. Drain output around 50 since 1pm yesterday. Having mild issues swallowing breakfast but able to tolerate with liquid.   POD1: She is doing well. Complains of posterior cervical pain currently rated 7-8/10 but hasn't received pain medication this AM. Pain 5/10 with pain medication. Continues to have right arm weakness, but states that it is quickly improving. Bilateral fingertip numbness persists following surgery. Arm pain has resolved. No new numbness or tingling.  She has ambulated, voided, and eaten without issue (minimal difficulty swallowing food). Drain output 150.    POD 0: Patient is here for C3-C7 ACDF for cervical radiculopathy.  Tolerated procedure well.  Evaluated in postop recovery, still disoriented from anesthesia.  Complains of 10/10 pain posterior cervical area. Unable to accurately determine if symptoms prior to surgery have resolved, but feels they have improved.   Physical Exam: Vitals:   08/07/18 1544 08/07/18 2307  BP: 119/73 117/83  Pulse: 87 84  Resp: 17 18  Temp: 98.6 F (37 C) 97.9 F (36.6 C)  SpO2: 94% 92%    AA Ox3 C collar present Skin: Glue intact at incision site. No edema, bleeding, erythema. Drain site intact - no active bleeding from insertion point. Strength: 5/5 left upper extremity and lower extremities. 4+ right grip, 4+ right tricep, 2 right bicep, 3 right deltoid  Sensation: intact and symmetric upper and lower extremities.   Data:  Recent Labs  Lab 08/05/18 1334  NA 138  K 4.5  CL 101  CO2 28  BUN 20  CREATININE 1.03*  GLUCOSE 100*  CALCIUM 9.7   No results for input(s): AST, ALT, ALKPHOS in the last 168 hours.  Invalid input(s): TBILI   Recent Labs  Lab 08/05/18 1334  WBC 9.2  HGB 15.0  HCT 44.9  PLT 280   Recent Labs  Lab  08/05/18 1334  APTT 34  INR 0.94         Other tests/results:  EXAM: CERVICAL SPINE - 2-3 VIEW  COMPARISON:  MRI same day.  Operative imaging 08/06/2018  FINDINGS: ACDF from C3 to C7. Interbody spacers appear well positioned. Anterior plate and screws appear well positioned. Soft tissue drain from the left remains in place. Airway appears intact. No abnormal or unexpected air or gas shadows.  IMPRESSION: No unexpected finding by radiography related to ACDF from C3-C7.  EXAM: MRI CERVICAL SPINE WITHOUT CONTRAST  TECHNIQUE: Multiplanar, multisequence MR imaging of the cervical spine was performed. No intravenous contrast was administered.  COMPARISON:  03/19/2018 cervical spine MRI.  FINDINGS: Alignment: Straightening of cervical lordosis without listhesis.  Vertebrae: C3-C7 ACDF. Mild stable edema within the superior T1 endplate with central Schmorl's node. Stable non expansile nonspecific T2 hyperintense lesion within the right C7 transverse process, probably a fat poor hemangioma. There is an additional similar stable lesion within the T3 vertebral body. No additional focus of abnormal bone marrow signal. No findings of discitis. No epidural hematoma. Susceptibility artifact partially obscures the vertebral bodies and the prevertebral soft tissues.  Cord: Normal signal and morphology.  Posterior Fossa, vertebral arteries, paraspinal tissues: Negative.  Disc levels:  C2-3: Stable disc bulge and right-sided uncovertebral/facet hypertrophy. Mild right foraminal stenosis. No canal stenosis.  C3-4: Discectomy and fusion. Stable posterior endplate marginal osteophytes, bilateral uncovertebral hypertrophy, and bilateral facet hypertrophy. Stable mild canal and moderate bilateral foraminal stenosis.  C4-5:  Discectomy and fusion. Stable posterior endplate marginal osteophytes with right greater than left uncovertebral and facet hypertrophy. Severe  right and mild left foraminal stenosis. No significant spinal canal stenosis.  C5-6: Discectomy and fusion. Stable posterior endplate marginal osteophytes as well as left-greater-than-right uncovertebral and facet hypertrophy. Severe right and moderate left foraminal stenosis. No significant canal stenosis.  C6-7: Discectomy and fusion. Stable left-greater-than-right uncovertebral and facet hypertrophy. Moderate left and mild right foraminal stenosis. No significant canal stenosis.  C7-T1: Stable mild disc bulge. No significant foraminal or canal stenosis.  IMPRESSION: 1. Interval C3-C7 ACDF. Improved patency of the cervical spinal canal. No findings of fracture, diskitis, epidural hematoma, cord compression, or discrete soft tissue collection. 2. Otherwise stable cervical spondylosis with prominent uncovertebral and facet hypertrophy resulting in neural foraminal stenosis at multiple levels. Findings are greatest at the C4-5 and C5-6 levels where there is severe right foraminal stenosis. Multilevel mild and moderate foraminal stenosis.  Assessment/Plan:  Etheleen MayhewKristen Rudd Kotlyar POD2 status post C3-C7 ACDF for cervical radiculopathy. 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 Drain was removed without issue. Patient is scheduled [facility] with [facility]. She will follow up in 2 weeks at the clinic to monitor progress.   Ivar DrapeAmanda Zarya Lasseigne PA-C Department of Neurosurgery

## 2018-08-08 NOTE — Care Management (Signed)
Patient was referred to [facility] under "protocol" prior to surgery.  She is discharging today and [facility] has beein following.  Initial visit will be made 08/09/2018.

## 2018-08-08 NOTE — Anesthesia Postprocedure Evaluation (Signed)
Anesthesia Post Note  Patient: Mercedes Forbes  Procedure(s) Performed: ANTERIOR CERVICAL DECOMPRESSION/DISCECTOMY FUSION 4 LEVELS-c3-7 (N/A Neck)  Patient location during evaluation: PACU Anesthesia Type: General Level of consciousness: awake and alert Pain management: pain level controlled Vital Signs Assessment: post-procedure vital signs reviewed and stable Respiratory status: spontaneous breathing, nonlabored ventilation and respiratory function stable Cardiovascular status: blood pressure returned to baseline and stable Postop Assessment: no apparent nausea or vomiting Anesthetic complications: no     Last Vitals:  Vitals:   08/07/18 2307 08/08/18 1017  BP: 117/83 124/84  Pulse: 84 84  Resp: 18 18  Temp: 36.6 C 36.7 C  SpO2: 92% 93%    Last Pain:  Vitals:   08/08/18 1023  TempSrc:   PainSc: 4                  [name]

## 2018-08-08 NOTE — Care Management Important Message (Signed)
Copy of signed IM left with patient in room.  

## 2018-08-08 NOTE — Progress Notes (Addendum)
Drain removed without issue. No active bleeding. Site cleaned and dressed.

## 2018-08-08 NOTE — Progress Notes (Signed)
Procedure: C3-C7 ACDF Procedure date 08/06/2018 Diagnosis cervical radiculopathy  History:  POD2: Pain rated 6-7/10. Right arm weakness continues but she still feels that it is improving. Drain output around 50 since 1pm yesterday. Having mild issues swallowing breakfast but able to tolerate with liquid.   POD1: She is doing well. Complains of posterior cervical pain currently rated 7-8/10 but hasn't received pain medication this AM. Pain 5/10 with pain medication. Continues to have right arm weakness, but states that it is quickly improving. Bilateral fingertip numbness persists following surgery. Arm pain has resolved. No new numbness or tingling.  She has ambulated, voided, and eaten without issue (minimal difficulty swallowing food). Drain output 150.    POD 0: Patient is here for C3-C7 ACDF for cervical radiculopathy.  Tolerated procedure well.  Evaluated in postop recovery, still disoriented from anesthesia.  Complains of 10/10 pain posterior cervical area. Unable to accurately determine if symptoms prior to surgery have resolved, but feels they have improved.   Physical Exam: Vitals:   08/07/18 1544 08/07/18 2307  BP: 119/73 117/83  Pulse: 87 84  Resp: 17 18  Temp: 98.6 F (37 C) 97.9 F (36.6 C)  SpO2: 94% 92%    AA Ox3 Skin: Anterior cervical incision no active bleeding.  Glue intact.  Drain site intact.  No active bleeding from insertion point. Strength: 5/5 left upper extremity and lower extremities. 4+ right grip, 4+ right tricep, 2 right bicep, 3 right deltoid  Sensation: intact and symmetric upper and lower extremities.   Data:  Recent Labs  Lab 08/05/18 1334  NA 138  K 4.5  CL 101  CO2 28  BUN 20  CREATININE 1.03*  GLUCOSE 100*  CALCIUM 9.7   No results for input(s): AST, ALT, ALKPHOS in the last 168 hours.  Invalid input(s): TBILI   Recent Labs  Lab 08/05/18 1334  WBC 9.2  HGB 15.0  HCT 44.9  PLT 280   Recent Labs  Lab 08/05/18 1334  APTT 34   INR 0.94         Other tests/results:  EXAM: CERVICAL SPINE - 2-3 VIEW  COMPARISON:  MRI same day.  Operative imaging 08/06/2018  FINDINGS: ACDF from C3 to C7. Interbody spacers appear well positioned. Anterior plate and screws appear well positioned. Soft tissue drain from the left remains in place. Airway appears intact. No abnormal or unexpected air or gas shadows.  IMPRESSION: No unexpected finding by radiography related to ACDF from C3-C7.  EXAM: MRI CERVICAL SPINE WITHOUT CONTRAST  TECHNIQUE: Multiplanar, multisequence MR imaging of the cervical spine was performed. No intravenous contrast was administered.  COMPARISON:  03/19/2018 cervical spine MRI.  FINDINGS: Alignment: Straightening of cervical lordosis without listhesis.  Vertebrae: C3-C7 ACDF. Mild stable edema within the superior T1 endplate with central Schmorl's node. Stable non expansile nonspecific T2 hyperintense lesion within the right C7 transverse process, probably a fat poor hemangioma. There is an additional similar stable lesion within the T3 vertebral body. No additional focus of abnormal bone marrow signal. No findings of discitis. No epidural hematoma. Susceptibility artifact partially obscures the vertebral bodies and the prevertebral soft tissues.  Cord: Normal signal and morphology.  Posterior Fossa, vertebral arteries, paraspinal tissues: Negative.  Disc levels:  C2-3: Stable disc bulge and right-sided uncovertebral/facet hypertrophy. Mild right foraminal stenosis. No canal stenosis.  C3-4: Discectomy and fusion. Stable posterior endplate marginal osteophytes, bilateral uncovertebral hypertrophy, and bilateral facet hypertrophy. Stable mild canal and moderate bilateral foraminal stenosis.  C4-5: Discectomy and  fusion. Stable posterior endplate marginal osteophytes with right greater than left uncovertebral and facet hypertrophy. Severe right and mild left foraminal  stenosis. No significant spinal canal stenosis.  C5-6: Discectomy and fusion. Stable posterior endplate marginal osteophytes as well as left-greater-than-right uncovertebral and facet hypertrophy. Severe right and moderate left foraminal stenosis. No significant canal stenosis.  C6-7: Discectomy and fusion. Stable left-greater-than-right uncovertebral and facet hypertrophy. Moderate left and mild right foraminal stenosis. No significant canal stenosis.  C7-T1: Stable mild disc bulge. No significant foraminal or canal stenosis.  IMPRESSION: 1. Interval C3-C7 ACDF. Improved patency of the cervical spinal canal. No findings of fracture, diskitis, epidural hematoma, cord compression, or discrete soft tissue collection. 2. Otherwise stable cervical spondylosis with prominent uncovertebral and facet hypertrophy resulting in neural foraminal stenosis at multiple levels. Findings are greatest at the C4-5 and C5-6 levels where there is severe right foraminal stenosis. Multilevel mild and moderate foraminal stenosis.  Assessment/Plan:  Mercedes Forbes POD2 status post C3-C7 ACDF for cervical radiculopathy. 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   - continue to monitor drain output.  - mobilize - pain control - DVT prophylaxis - PT  Ivar Drape PA-C Department of Neurosurgery

## 2018-08-08 NOTE — Discharge Instructions (Signed)

## 2018-08-08 NOTE — Plan of Care (Signed)
  Problem: Activity: Goal: Risk for activity intolerance will decrease Outcome: Progressing   Problem: Pain Managment: Goal: General experience of comfort will improve Outcome: Progressing   

## 2018-08-12 ENCOUNTER — Telehealth: Payer: Self-pay

## 2018-08-12 NOTE — Telephone Encounter (Signed)
EMMI Follow-up: Noted on the report that the patient had unfilled Rx's.  I talked with Mercedes Forbes and she got her Rx's filled yesterday.  Still some pain but she is starting to feel better.  I let her know there would be another automated call with a different series of questions and to let us know if she had any concerns at that time.

## 2018-08-15 ENCOUNTER — Telehealth: Payer: Self-pay | Admitting: Licensed Clinical Social Worker

## 2018-08-15 NOTE — Telephone Encounter (Signed)
EMMI flagged patient for answering yes to feeling sad/hopeless/anxious/empty. Clinical Child psychotherapistocial Worker (CSW) contacted patient via telephone. Patient scored 4 on PHQ-9. Patient reported that she is doing okay and is starting to get feeling back in her arm. Per patient she has been dealing with depression for a while and goes to a psychiatrist every 6 months. Per patient [facility] and goes to group therapy and has peer support. Patient also reported she goes to N/A meetings on Monday nights and has peer support check on her weekly at home. Patient reported that she is not having thoughts of hurting herself and is well established with mental health resources. Patient reported no needs or concerns.   Baker Hughes IncorporatedBailey Allyse Fregeau, LCSW [phone]

## 2018-10-14 ENCOUNTER — Encounter: Payer: Self-pay | Admitting: Nurse Practitioner

## 2018-11-21 ENCOUNTER — Emergency Department
Admission: EM | Admit: 2018-11-21 | Discharge: 2018-11-21 | Disposition: A | Discharge status: Home / Self Care | Payer: Medicare Other | Attending: Emergency Medicine | Admitting: Emergency Medicine

## 2018-11-21 ENCOUNTER — Encounter: Payer: Self-pay | Admitting: Emergency Medicine

## 2018-11-21 ENCOUNTER — Emergency Department: Payer: Medicare Other

## 2018-11-21 ENCOUNTER — Other Ambulatory Visit: Payer: Self-pay

## 2018-11-21 DIAGNOSIS — M25561 Pain in right knee: Secondary | ICD-10-CM | POA: Insufficient documentation

## 2018-11-21 DIAGNOSIS — Z79899 Other long term (current) drug therapy: Secondary | ICD-10-CM | POA: Insufficient documentation

## 2018-11-21 DIAGNOSIS — E039 Hypothyroidism, unspecified: Secondary | ICD-10-CM | POA: Insufficient documentation

## 2018-11-21 DIAGNOSIS — M25531 Pain in right wrist: Secondary | ICD-10-CM | POA: Diagnosis present

## 2018-11-21 DIAGNOSIS — F17210 Nicotine dependence, cigarettes, uncomplicated: Secondary | ICD-10-CM | POA: Insufficient documentation

## 2018-11-21 MED ORDER — LIDOCAINE 5 % EX PTCH
2.0000 | MEDICATED_PATCH | CUTANEOUS | Status: DC
  Administered 2018-11-21: 2 via TRANSDERMAL
  Filled 2018-11-21: qty 2

## 2018-11-21 MED ORDER — METHYLPREDNISOLONE 4 MG PO TBPK: ORAL_TABLET | ORAL | 0 refills | Status: DC

## 2018-11-21 MED ORDER — PREDNISONE 20 MG PO TABS
60.0000 mg | ORAL_TABLET | Freq: Once | ORAL | Status: AC
  Administered 2018-11-21: 60 mg via ORAL
  Filled 2018-11-21: qty 3

## 2018-11-21 MED ORDER — CYCLOBENZAPRINE HCL 10 MG PO TABS: 10.0000 mg | ORAL_TABLET | Freq: Three times a day (TID) | ORAL | 0 refills | Status: DC | PRN

## 2018-11-21 NOTE — Discharge Instructions (Addendum)
Follow discharge care instruction and wear Lidoderm patch for 12 hours.  Follow-up with Dr. Dareen Piano to establish care.  Take medication as directed.

## 2018-11-21 NOTE — ED Triage Notes (Signed)
Larey Seat off back of trailer on truck yesterday-4-5 feet. Injured right wrist and right knee.

## 2018-11-21 NOTE — ED Provider Notes (Signed)
[facility] Emergency Department Provider Note   ____________________________________________   First MD Initiated Contact with Patient 11/21/18 1520     (approximate)  I have reviewed the triage vital signs and the nursing notes.   HISTORY  Chief Complaint Fall; Wrist Pain; and Knee Pain    HPI Mercedes Forbes is a 48 y.o. female patient presents with right wrist and right knee pain secondary to fall from trailer truck.  That occurred yesterday.  Patient denies loss of sensation or loss of function right hand.  Patient also states she is able to bear weight with pain to the right knee.  Patient rates her combined pain is 8/10.  Patient described the pain is "achy".  Patient stated no palliative measure for complaint.    Past Medical History:  Diagnosis Date  . Allergy   . Anxiety   . Arthritis   . Bipolar 1 disorder (HCC)   . Depression   . GERD (gastroesophageal reflux disease)   . Headache    migraines  . Personality disorder (HCC)   . Polysubstance abuse (HCC)   . Schizophrenia [facility])     Patient Active Problem List   Diagnosis Date Noted  . Cervical radiculopathy 08/06/2018  . Chronic upper extremity pain (Secondary Area of Pain) (Bilateral) (R>L) 07/28/2018  . DDD (degenerative disc disease), cervical 07/28/2018  . Chronic neck pain (Primary Area of Pain) (Bilateral) (R>L) 07/15/2018  . Chronic knee pain [facility] Area of Pain) (Right) 07/15/2018  . Chronic low back pain ([name]) (Bilateral) (R>L) w/ sciatica (Bilateral) 07/15/2018  . Chronic lower extremity pain (Bilateral) (R>L) 07/15/2018  . Chronic hand pain (Bilateral) (L>R) 07/15/2018  . Chronic pain syndrome 07/15/2018  . Long term current use of opiate analgesic 07/15/2018  . Pharmacologic therapy 07/15/2018  . Disorder of skeletal system 07/15/2018  . Problems influencing health status 07/15/2018  . Lower extremity weakness 07/15/2018  . Arm numbness  04/21/2018  . 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 Hypothyroidism 03/08/2017  . Opiate abuse, episodic (HCC) 07/22/2016  . PTSD (post-traumatic stress disorder) 04/04/2016  . Tobacco use disorder 04/03/2016  . Bipolar 2 disorder, major depressive episode (HCC) 04/02/2016  . Cocaine use disorder, moderate, dependence (HCC) 04/02/2016  . Cannabis use disorder, moderate, dependence (HCC) 04/02/2016    Past Surgical History:  Procedure Laterality Date  . ANTERIOR CERVICAL DECOMPRESSION/DISCECTOMY FUSION 4 LEVELS N/A 08/06/2018   Procedure: ANTERIOR CERVICAL DECOMPRESSION/DISCECTOMY FUSION 4 LEVELS-c3-7;  Surgeon: Venetia Night, MD;  Location: [facility] ORS;  Service: Neurosurgery;  Laterality: N/A;  . KNEE ARTHROSCOPY WITH PATELLA RECONSTRUCTION     left patella removed  . wrists Bilateral     Prior to Admission medications   Medication Sig Start Date End Date Taking? Authorizing Provider  acetaminophen (TYLENOL) 650 MG CR tablet Take 1,300 mg by mouth every 8 (eight) hours as needed for pain.    [provider]  busPIRone (BUSPAR) 30 MG tablet Take 30 mg by mouth 3 (three) times daily.     [provider]  cyclobenzaprine (FLEXERIL) 10 MG tablet Take 1 tablet (10 mg total) by mouth 3 (three) times daily as needed. 11/21/18   Joni Reining, PA-C  DULoxetine (CYMBALTA) 60 MG capsule Take 120 mg by mouth daily.     [provider]  gabapentin (NEURONTIN) 300 MG capsule Take 300 mg by mouth 3 (three) times daily.    [provider]  hydrOXYzine (ATARAX/VISTARIL) 50 MG tablet Take 50 mg  by mouth 3 (three) times daily.     [provider]  methocarbamol (ROBAXIN) 750 MG tablet Take 1 tablet (750 mg total) by mouth every 6 (six) hours. 08/08/18   Ivar Drape, PA-C  methylPREDNISolone (MEDROL DOSEPAK) 4 MG TBPK tablet Take Tapered dose as directed 11/21/18   Joni Reining, PA-C  oxyCODONE (OXY IR/ROXICODONE) 5 MG immediate release tablet Take 1  tablet (5 mg total) by mouth every 3 (three) hours as needed for moderate pain ((score 4 to 6)). 08/08/18   Ivar Drape, PA-C  phenylephrine (SUDAFED PE) 10 MG TABS tablet Take 10 mg by mouth every 4 (four) hours as needed (for nasal congestion).    [provider]  ranitidine (ZANTAC) 150 MG capsule Take 150 mg by mouth 2 (two) times daily as needed for heartburn.     [provider]  traZODone (DESYREL) 100 MG tablet Take 1 tablet (100 mg total) by mouth at bedtime. 04/19/17   [name], MD    Allergies Aspirin; Flurbiprofen; and Nsaids  Family History  Problem Relation Age of Onset  . Heart disease Mother   . Hyperlipidemia Mother   . Hypertension Mother   . Kidney disease Mother   . Arthritis Mother   . Stroke Father   . Diabetes Father     Social History Social History   Tobacco Use  . Smoking status: Current Every Day Smoker    Packs/day: 0.25    Types: Cigarettes  . Smokeless tobacco: Never Used  . Tobacco comment: Nicotine patch was ordered  Substance Use Topics  . Alcohol use: No  . Drug use: Not Currently    Types: Cocaine, Amphetamines, Marijuana    Comment: last 1 month ago    Review of Systems Constitutional: No fever/chills Eyes: No visual changes. ENT: No sore throat. Cardiovascular: Denies chest pain. Respiratory: Denies shortness of breath. Gastrointestinal: No abdominal pain.  No nausea, no vomiting.  No diarrhea.  No constipation. Genitourinary: Negative for dysuria. Musculoskeletal: Right wrist and right knee pain.   Skin: Negative for rash. Neurological: Negative for headaches, focal weakness or numbness. Psychiatric:  Anxiety, bipolar, depression, schizophrenic, and polysubstance abuse. Allergic/Immunilogical: As present NSAIDs ____________________________________________   PHYSICAL EXAM:  VITAL SIGNS: ED Triage Vitals  Enc Vitals Group     BP 11/21/18 1505 118/86     Pulse Rate 11/21/18 1505 (!) 110      Resp 11/21/18 1505 20     Temp 11/21/18 1505 97.6 F (36.4 C)     Temp Source 11/21/18 1505 Oral     SpO2 11/21/18 1505 100 %     Weight 11/21/18 1505 180 lb (81.6 kg)     Height 11/21/18 1505 5\' 8"  (1.727 m)     Head Circumference --      Peak Flow --      Pain Score 11/21/18 1511 8     Pain Loc --      Pain Edu? --      Excl. in GC? --     Constitutional: Alert and oriented. Well appearing and in no acute distress. Neck: No stridor.   Cardiovascular: Normal rate, regular rhythm. Grossly normal heart sounds.  Good peripheral circulation. Respiratory: Normal respiratory effort.  No retractions. Lungs CTAB. Gastrointestinal: Soft and nontender. No distention. No abdominal bruits. No CVA tenderness. Musculoskeletal: No obvious deformity to the right wrist or knee.  Patient has full and equal range of motion of the wrist and right knee.   Neurologic:  Normal speech and language. No gross focal neurologic deficits are appreciated. No gait instability. Skin:  Skin is warm, dry and intact. No rash noted.  No abrasion or ecchymosis. Psychiatric: Mood and affect are normal. Speech and behavior are normal.  ____________________________________________   LABS (all labs ordered are listed, but only abnormal results are displayed)  Labs Reviewed - No data to display ____________________________________________  EKG   ____________________________________________  RADIOLOGY  ED MD interpretation:    Official radiology report(s): Dg Wrist Complete Right  Result Date: 11/21/2018 CLINICAL DATA:  Right wrist pain status post fall yesterday. EXAM: RIGHT WRIST - COMPLETE 3+ VIEW COMPARISON:  None. FINDINGS: There are severe osteoarthritic changes of the right first metacarpal-carpal joint. There is no acute displaced fracture or dislocation noted. IMPRESSION: No acute fracture or dislocation identified. Electronically Signed   By: Sherian Rein M.D.   On: 11/21/2018 16:37   Dg Knee  Complete 4 Views Right  Result Date: 11/21/2018 CLINICAL DATA:  Status post fall yesterday with right knee pain. EXAM: RIGHT KNEE - COMPLETE 4+ VIEW COMPARISON:  July 25, 2018 FINDINGS: No evidence of fracture, dislocation, or joint effusion. Mild decreased medial femoral tibial joint space is identified. Soft tissues are unremarkable. IMPRESSION: No acute fracture or dislocation identified. Mild degenerative joint changes of the right knee. Electronically Signed   By: Sherian Rein M.D.   On: 11/21/2018 16:38    ____________________________________________   PROCEDURES  Procedure(s) performed (including Critical Care):  Procedures   ____________________________________________   INITIAL IMPRESSION / ASSESSMENT AND PLAN / ED COURSE  As part of my medical decision making, I reviewed the following data within the electronic MEDICAL RECORD NUMBER         Patient presents with right wrist and knee pain secondary to a fall yesterday.  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  Patient given discharge care instruction.  Patient advised to follow-up with new PCP for continued care.      ____________________________________________   FINAL CLINICAL IMPRESSION(S) / ED DIAGNOSES  Final diagnoses:  Right wrist pain  Knee pain, right anterior     ED Discharge Orders         Ordered    cyclobenzaprine (FLEXERIL) 10 MG tablet  3 times daily PRN,   Status:  Discontinued     11/21/18 1652    cyclobenzaprine (FLEXERIL) 10 MG tablet  3 times daily PRN     11/21/18 1653    methylPREDNISolone (MEDROL DOSEPAK) 4 MG TBPK tablet     11/21/18 1654           Note:  This document was prepared using Dragon voice recognition software and may include unintentional dictation errors.    Joni Reining, PA-C 11/21/18 1657    Jene Every, MD 11/24/18 1147

## 2018-12-08 ENCOUNTER — Encounter: Payer: Self-pay | Admitting: Emergency Medicine

## 2018-12-08 ENCOUNTER — Other Ambulatory Visit: Payer: Self-pay

## 2018-12-08 ENCOUNTER — Emergency Department
Admission: EM | Admit: 2018-12-08 | Discharge: 2018-12-08 | Disposition: A | Discharge status: Home / Self Care | Payer: Medicare Other | Attending: Emergency Medicine | Admitting: Emergency Medicine

## 2018-12-08 DIAGNOSIS — F17210 Nicotine dependence, cigarettes, uncomplicated: Secondary | ICD-10-CM | POA: Insufficient documentation

## 2018-12-08 DIAGNOSIS — K122 Cellulitis and abscess of mouth: Secondary | ICD-10-CM | POA: Diagnosis not present

## 2018-12-08 DIAGNOSIS — Z79899 Other long term (current) drug therapy: Secondary | ICD-10-CM | POA: Diagnosis not present

## 2018-12-08 DIAGNOSIS — K047 Periapical abscess without sinus: Secondary | ICD-10-CM | POA: Diagnosis not present

## 2018-12-08 DIAGNOSIS — K0889 Other specified disorders of teeth and supporting structures: Secondary | ICD-10-CM | POA: Diagnosis present

## 2018-12-08 MED ORDER — CLINDAMYCIN HCL 300 MG PO CAPS: 300.0000 mg | ORAL_CAPSULE | Freq: Three times a day (TID) | ORAL | 0 refills | Status: AC

## 2018-12-08 MED ORDER — CLINDAMYCIN HCL 150 MG PO CAPS
300.0000 mg | ORAL_CAPSULE | Freq: Once | ORAL | Status: AC
  Administered 2018-12-08: 300 mg via ORAL
  Filled 2018-12-08: qty 2

## 2018-12-08 MED ORDER — LIDOCAINE HCL (PF) 1 % IJ SOLN
5.0000 mL | Freq: Once | INTRAMUSCULAR | Status: AC
  Administered 2018-12-08: 5 mL
  Filled 2018-12-08: qty 5

## 2018-12-08 MED ORDER — MORPHINE SULFATE (PF) 4 MG/ML IV SOLN
4.0000 mg | Freq: Once | INTRAVENOUS | Status: AC
  Administered 2018-12-08: 4 mg via INTRAMUSCULAR
  Filled 2018-12-08: qty 1

## 2018-12-08 NOTE — ED Provider Notes (Signed)
[facility] Emergency Department Provider Note  Time seen: 6:29 PM  I have reviewed the triage vital signs and the nursing notes.   HISTORY  Chief Complaint Dental Pain   HPI Mercedes Forbes is a 48 y.o. female with a past medical history of anxiety, bipolar, gastric reflux, schizophrenia, presents emergency department for right sided face pain and swelling.  Patient states for the past several days she has had pain in her right upper molar area, and over the past 24 hours has noticed increased redness and swelling of the right side of her face.  Denies any fever, nausea, vomiting, largely negative review of systems.   Past Medical History:  Diagnosis Date  . Allergy   . Anxiety   . Arthritis   . Bipolar 1 disorder (HCC)   . Depression   . GERD (gastroesophageal reflux disease)   . Headache    migraines  . Personality disorder (HCC)   . Polysubstance abuse (HCC)   . Schizophrenia [facility])     Patient Active Problem List   Diagnosis Date Noted  . Cervical radiculopathy 08/06/2018  . Chronic upper extremity pain (Secondary Area of Pain) (Bilateral) (R>L) 07/28/2018  . DDD (degenerative disc disease), cervical 07/28/2018  . Chronic neck pain (Primary Area of Pain) (Bilateral) (R>L) 07/15/2018  . Chronic knee pain [facility] Area of Pain) (Right) 07/15/2018  . Chronic low back pain ([name]) (Bilateral) (R>L) w/ sciatica (Bilateral) 07/15/2018  . Chronic lower extremity pain (Bilateral) (R>L) 07/15/2018  . Chronic hand pain (Bilateral) (L>R) 07/15/2018  . Chronic pain syndrome 07/15/2018  . Long term current use of opiate analgesic 07/15/2018  . Pharmacologic therapy 07/15/2018  . Disorder of skeletal system 07/15/2018  . Problems influencing health status 07/15/2018  . Lower extremity weakness 07/15/2018  . Arm numbness 04/21/2018  . 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 Hypothyroidism 03/08/2017  . Opiate abuse, episodic (HCC)  07/22/2016  . PTSD (post-traumatic stress disorder) 04/04/2016  . Tobacco use disorder 04/03/2016  . Bipolar 2 disorder, major depressive episode (HCC) 04/02/2016  . Cocaine use disorder, moderate, dependence (HCC) 04/02/2016  . Cannabis use disorder, moderate, dependence (HCC) 04/02/2016    Past Surgical History:  Procedure Laterality Date  . ANTERIOR CERVICAL DECOMPRESSION/DISCECTOMY FUSION 4 LEVELS N/A 08/06/2018   Procedure: ANTERIOR CERVICAL DECOMPRESSION/DISCECTOMY FUSION 4 LEVELS-c3-7;  Surgeon: Venetia Night, MD;  Location: [facility] ORS;  Service: Neurosurgery;  Laterality: N/A;  . KNEE ARTHROSCOPY WITH PATELLA RECONSTRUCTION     left patella removed  . wrists Bilateral     Prior to Admission medications   Medication Sig Start Date End Date Taking? Authorizing Provider  acetaminophen (TYLENOL) 650 MG CR tablet Take 1,300 mg by mouth every 8 (eight) hours as needed for pain.    [provider]  busPIRone (BUSPAR) 30 MG tablet Take 30 mg by mouth 3 (three) times daily.     [provider]  cyclobenzaprine (FLEXERIL) 10 MG tablet Take 1 tablet (10 mg total) by mouth 3 (three) times daily as needed. 11/21/18   Joni Reining, PA-C  DULoxetine (CYMBALTA) 60 MG capsule Take 120 mg by mouth daily.     [provider]  gabapentin (NEURONTIN) 300 MG capsule Take 300 mg by mouth 3 (three) times daily.    [provider]  hydrOXYzine (ATARAX/VISTARIL) 50 MG tablet Take 50 mg by mouth 3 (three) times daily.     [provider]  methocarbamol (ROBAXIN) 750 MG tablet Take 1  tablet (750 mg total) by mouth every 6 (six) hours. 08/08/18   Ivar Drape, PA-C  methylPREDNISolone (MEDROL DOSEPAK) 4 MG TBPK tablet Take Tapered dose as directed 11/21/18   Joni Reining, PA-C  oxyCODONE (OXY IR/ROXICODONE) 5 MG immediate release tablet Take 1 tablet (5 mg total) by mouth every 3 (three) hours as needed for moderate pain ((score 4 to 6)). 08/08/18   Ivar Drape, PA-C  phenylephrine (SUDAFED PE) 10 MG TABS tablet Take 10 mg by mouth every 4 (four) hours as needed (for nasal congestion).    [provider]  ranitidine (ZANTAC) 150 MG capsule Take 150 mg by mouth 2 (two) times daily as needed for heartburn.     [provider]  traZODone (DESYREL) 100 MG tablet Take 1 tablet (100 mg total) by mouth at bedtime. 04/19/17   [name], MD    Allergies  Allergen Reactions  . Aspirin Other (See Comments) and Shortness Of Breath    Shortness of breath like an asthma attack  . Flurbiprofen Shortness Of Breath  . Nsaids Shortness Of Breath    Family History  Problem Relation Age of Onset  . Heart disease Mother   . Hyperlipidemia Mother   . Hypertension Mother   . Kidney disease Mother   . Arthritis Mother   . Stroke Father   . Diabetes Father     Social History Social History   Tobacco Use  . Smoking status: Current Every Day Smoker    Packs/day: 0.25    Types: Cigarettes  . Smokeless tobacco: Never Used  . Tobacco comment: Nicotine patch was ordered  Substance Use Topics  . Alcohol use: No  . Drug use: Not Currently    Types: Cocaine, Amphetamines, Marijuana    Comment: last 1 month ago    Review of Systems Constitutional: Negative for fever. ENT: Right upper dental pain, right facial swelling. Cardiovascular: Negative for chest pain. Respiratory: Negative for shortness of breath. Gastrointestinal: Negative for abdominal pain, vomiting  Genitourinary: Negative for urinary compaints Musculoskeletal: Negative for musculoskeletal complaints Skin: Mild redness of the right face. Neurological: Negative for headache All other ROS negative  ____________________________________________   PHYSICAL EXAM:  VITAL SIGNS: ED Triage Vitals  Enc Vitals Group     BP 12/08/18 1817 133/86     Pulse Rate 12/08/18 1817 100     Resp 12/08/18 1817 18     Temp 12/08/18 1817 98.9 F (37.2 C)     Temp  src --      SpO2 12/08/18 1817 98 %     Weight 12/08/18 1818 195 lb (88.5 kg)     Height 12/08/18 1818  (1.727 m)     Head Circumference --      Peak Flow --      Pain Score 12/08/18 1818 8     Pain Loc --      Pain Edu? --      Excl. in GC? --    Constitutional: Alert and oriented. Well appearing and in no distress. Eyes: Normal exam ENT   Head: Normocephalic and atraumatic   Mouth/Throat: Mucous membranes are moist.  Patient has extremely poor dentition throughout.  She does have a visible abscess in the right upper molar area. Cardiovascular: Normal rate, regular rhythm. No murmur Respiratory: Normal respiratory effort without tachypnea nor retractions. Breath sounds are clear Gastrointestinal: Soft and nontender. No distention.  Musculoskeletal: Nontender with normal range of motion in all extremities.  Neurologic:  Normal speech and language. No gross focal neurologic deficits  Skin:  Skin is warm, dry and intact.  Psychiatric: Mood and affect are normal.  ____________________________________________   INITIAL IMPRESSION / ASSESSMENT AND PLAN / ED COURSE  Pertinent labs & imaging results that were available during my care of the patient were reviewed by me and considered in my medical decision making (see chart for details).  Patient presents to the emergency department with dental pain.  Examination is consistent with a dental abscess with mild swelling of the face.  No fever.  We will perform local I&D with Xylocaine injection.  We will dose IM morphine prior to I&D.  Patient will require antibiotics upon discharge.  I discussed very strict return precautions for any increased redness pain swelling or fever.  Incision and drainage performed by myself.  Large amount of exudate removed.  We will place the patient on clindamycin and discharge with dental follow-up.  INCISION AND DRAINAGE Performed by: Minna Antis Consent: Verbal consent obtained. Risks and  benefits: risks, benefits and alternatives were discussed Type: abscess  Body area: right upper molar/gums area  Anesthesia: local infiltration  Incision was made with a 18 gauge needle  Local anesthetic: lidocaine 1% wo epinephrine  Anesthetic total: 3 ml  Complexity: complex Blunt dissection to break up loculations  Drainage: purulent  Drainage amount: approx 3-4cc  Packing material: none  Patient tolerance: Patient tolerated the procedure well with no immediate complications.     ____________________________________________   FINAL CLINICAL IMPRESSION(S) / ED DIAGNOSES  Dental abscess Cellulitis   Minna Antis, MD 12/08/18 [phone]

## 2018-12-08 NOTE — ED Triage Notes (Signed)
Pt to ER via EMS from home with c/o right upper jaw pain and swelling.  Pt states started yesterday and has gotten worse today.

## 2018-12-08 NOTE — Discharge Instructions (Addendum)
Take your antibiotics as prescribed for the entire course.  Please call the numbers provided to arrange dental follow-up as soon as possible.  Return to the emergency department for any increased redness, swelling, or development of fever.

## 2018-12-08 NOTE — ED Notes (Signed)
Pt is discharge at this time, pt stated that she does not have a ride home. Pt is A&OX4, has a steady gait. Spoke with charge nurse Raquel and provider and stated that it would be okay to discharge patient.

## 2019-01-02 ENCOUNTER — Telehealth: Payer: Self-pay | Admitting: Pain Medicine

## 2019-01-02 NOTE — Telephone Encounter (Signed)
Spoke with patient, she would like to proceed with CESI.  There is a standing order for this from she was last seen in November.  I told her that I would send to Alona Bene to see if she needs PA and then we will put her on the list to call when we are able to begin procedures again.

## 2019-01-02 NOTE — Telephone Encounter (Signed)
Patient has seen Dr. Laban Emperor and is currently seeing Dr. Myer Haff. Will eventually have to have surgery. She wants to make appt. And is open to having procedures. Can she be set up for Virtual Visit or wait for procedure visit. Please ask Dr. Laban Emperor and advise

## 2019-01-05 NOTE — Telephone Encounter (Signed)
She doesn't need prior auth. I will put her on the list for cesi for when we open back up

## 2019-07-03 ENCOUNTER — Emergency Department
Admission: EM | Admit: 2019-07-03 | Discharge: 2019-07-03 | Disposition: A | Discharge status: Home / Self Care | Payer: Medicare Other | Attending: Emergency Medicine | Admitting: Emergency Medicine

## 2019-07-03 ENCOUNTER — Encounter: Payer: Self-pay | Admitting: Emergency Medicine

## 2019-07-03 ENCOUNTER — Emergency Department: Payer: Medicare Other

## 2019-07-03 ENCOUNTER — Other Ambulatory Visit: Payer: Self-pay

## 2019-07-03 DIAGNOSIS — Z79899 Other long term (current) drug therapy: Secondary | ICD-10-CM | POA: Insufficient documentation

## 2019-07-03 DIAGNOSIS — F319 Bipolar disorder, unspecified: Secondary | ICD-10-CM | POA: Insufficient documentation

## 2019-07-03 DIAGNOSIS — Z20828 Contact with and (suspected) exposure to other viral communicable diseases: Secondary | ICD-10-CM | POA: Insufficient documentation

## 2019-07-03 DIAGNOSIS — R05 Cough: Secondary | ICD-10-CM | POA: Diagnosis not present

## 2019-07-03 DIAGNOSIS — H5713 Ocular pain, bilateral: Secondary | ICD-10-CM | POA: Insufficient documentation

## 2019-07-03 DIAGNOSIS — G43809 Other migraine, not intractable, without status migrainosus: Secondary | ICD-10-CM | POA: Insufficient documentation

## 2019-07-03 DIAGNOSIS — Z886 Allergy status to analgesic agent status: Secondary | ICD-10-CM | POA: Diagnosis not present

## 2019-07-03 DIAGNOSIS — F17210 Nicotine dependence, cigarettes, uncomplicated: Secondary | ICD-10-CM | POA: Diagnosis not present

## 2019-07-03 LAB — BASIC METABOLIC PANEL
Anion gap: 9 (ref 5–15)
BUN: 13 mg/dL (ref 6–20)
CO2: 24 mmol/L (ref 22–32)
Calcium: 9.4 mg/dL (ref 8.9–10.3)
Chloride: 106 mmol/L (ref 98–111)
Creatinine, Ser: 0.94 mg/dL (ref 0.44–1.00)
GFR calc Af Amer: >60 mL/min (ref >60)
GFR calc non Af Amer: >60 mL/min (ref >60)
Glucose, Bld: 76 mg/dL (ref 70–99)
Potassium: 3.6 mmol/L (ref 3.5–5.1)
Sodium: 139 mmol/L (ref 135–145)

## 2019-07-03 LAB — CBC
HCT: 38 % (ref 36.0–46.0)
Hemoglobin: 12.8 g/dL (ref 12.0–15.0)
MCH: 29.6 pg (ref 26.0–34.0)
MCHC: 33.7 g/dL (ref 30.0–36.0)
MCV: 88 fL (ref 80.0–100.0)
Platelets: 303 K/uL (ref 150–400)
RBC: 4.32 MIL/uL (ref 3.87–5.11)
RDW: 13.3 % (ref 11.5–15.5)
WBC: 6.8 K/uL (ref 4.0–10.5)
nRBC: 0 % (ref 0.0–0.2)

## 2019-07-03 MED ORDER — SUMATRIPTAN SUCCINATE 6 MG/0.5ML ~~LOC~~ SOLN
6.0000 mg | Freq: Once | SUBCUTANEOUS | Status: AC
  Administered 2019-07-03: 6 mg via SUBCUTANEOUS
  Filled 2019-07-03: qty 0.5

## 2019-07-03 MED ORDER — VISINE-AC 0.05-0.25 % OP SOLN: 2.0000 [drp] | Freq: Three times a day (TID) | OPHTHALMIC | 0 refills | Status: DC | PRN

## 2019-07-03 MED ORDER — TETRACAINE HCL 0.5 % OP SOLN
2.0000 [drp] | Freq: Once | OPHTHALMIC | Status: AC
  Administered 2019-07-03: 2 [drp] via OPHTHALMIC
  Filled 2019-07-03: qty 4

## 2019-07-03 MED ORDER — ONDANSETRON HCL 4 MG/2ML IJ SOLN
4.0000 mg | Freq: Once | INTRAMUSCULAR | Status: AC
  Administered 2019-07-03: 4 mg via INTRAVENOUS
  Filled 2019-07-03: qty 2

## 2019-07-03 MED ORDER — KETOROLAC TROMETHAMINE 0.4 % OP SOLN: 1.0000 [drp] | Freq: Four times a day (QID) | OPHTHALMIC | 0 refills | Status: DC

## 2019-07-03 MED ORDER — DIPHENHYDRAMINE HCL 50 MG/ML IJ SOLN
25.0000 mg | Freq: Once | INTRAMUSCULAR | Status: AC
  Administered 2019-07-03: 25 mg via INTRAVENOUS
  Filled 2019-07-03: qty 1

## 2019-07-03 MED ORDER — PREDNISONE 10 MG PO TABS: ORAL_TABLET | ORAL | 0 refills | Status: DC

## 2019-07-03 MED ORDER — DEXAMETHASONE SODIUM PHOSPHATE 10 MG/ML IJ SOLN
10.0000 mg | Freq: Once | INTRAMUSCULAR | Status: AC
  Administered 2019-07-03: 10 mg via INTRAVENOUS
  Filled 2019-07-03: qty 1

## 2019-07-03 MED ORDER — FLUORESCEIN SODIUM 1 MG OP STRP
1.0000 | ORAL_STRIP | Freq: Once | OPHTHALMIC | Status: AC
  Administered 2019-07-03: 1 via OPHTHALMIC
  Filled 2019-07-03: qty 1

## 2019-07-03 NOTE — ED Notes (Signed)
Pt ambulatory from triage, reports eye pain and sensitivity to light with nasal congestion and cough. A&Ox4.

## 2019-07-03 NOTE — ED Provider Notes (Signed)
[facility] Emergency Department Provider Note  ____________________________________________  Time seen: Approximately 5:46 PM  I have reviewed the triage vital signs and the nursing notes.   HISTORY  Chief Complaint URI    HPI Mercedes Forbes is a 48 y.o. female that presents to the emergency department for evaluation of intermittent migraines, light sensitivity, bilateral eye pain, nasal congestion, productive cough for 2 weeks.  Cough started as nonproductive but has been productive with white sputum for the last couple of days.  Patient states that she has a history of migraines and her migraines feel like her usual migraines.  She regularly has photosensitivity with her migraines.  The migraines however have been intermittently resolving but the bilateral photosensitivity and bilateral eye pain lingers.  She has had some mild blurry vision, which has been consistent for 2 weeks and has not worsened. No pain with eye movements.  Eye pain and photophobia has not changed in character in the last 2 weeks.  She does not wear glasses or contacts.  No fevers, dizziness, shortness of breath, chest pain, nausea, vomiting, abdominal pain.   Past Medical History:  Diagnosis Date  . Allergy   . Anxiety   . Arthritis   . Bipolar 1 disorder (HCC)   . Depression   . GERD (gastroesophageal reflux disease)   . Headache    migraines  . Personality disorder (HCC)   . Polysubstance abuse (HCC)   . Schizophrenia [facility])     Patient Active Problem List   Diagnosis Date Noted  . Cervical radiculopathy 08/06/2018  . Chronic upper extremity pain (Secondary Area of Pain) (Bilateral) (R>L) 07/28/2018  . DDD (degenerative disc disease), cervical 07/28/2018  . Chronic neck pain (Primary Area of Pain) (Bilateral) (R>L) 07/15/2018  . Chronic knee pain [facility] Area of Pain) (Right) 07/15/2018  . Chronic low back pain ([name]) (Bilateral) (R>L) w/ sciatica  (Bilateral) 07/15/2018  . Chronic lower extremity pain (Bilateral) (R>L) 07/15/2018  . Chronic hand pain (Bilateral) (L>R) 07/15/2018  . Chronic pain syndrome 07/15/2018  . Long term current use of opiate analgesic 07/15/2018  . Pharmacologic therapy 07/15/2018  . Disorder of skeletal system 07/15/2018  . Problems influencing health status 07/15/2018  . Lower extremity weakness 07/15/2018  . Arm numbness 04/21/2018  . 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 Hypothyroidism 03/08/2017  . Opiate abuse, episodic (HCC) 07/22/2016  . PTSD (post-traumatic stress disorder) 04/04/2016  . Tobacco use disorder 04/03/2016  . Bipolar 2 disorder, major depressive episode (HCC) 04/02/2016  . Cocaine use disorder, moderate, dependence (HCC) 04/02/2016  . Cannabis use disorder, moderate, dependence (HCC) 04/02/2016    Past Surgical History:  Procedure Laterality Date  . ANTERIOR CERVICAL DECOMPRESSION/DISCECTOMY FUSION 4 LEVELS N/A 08/06/2018   Procedure: ANTERIOR CERVICAL DECOMPRESSION/DISCECTOMY FUSION 4 LEVELS-c3-7;  Surgeon: Venetia Night, MD;  Location: [facility] ORS;  Service: Neurosurgery;  Laterality: N/A;  . KNEE ARTHROSCOPY WITH PATELLA RECONSTRUCTION     left patella removed  . wrists Bilateral     Prior to Admission medications   Medication Sig Start Date End Date Taking? Authorizing Provider  acetaminophen (TYLENOL) 650 MG CR tablet Take 1,300 mg by mouth every 8 (eight) hours as needed for pain.    [provider]  busPIRone (BUSPAR) 30 MG tablet Take 30 mg by mouth 3 (three) times daily.     [provider]  cyclobenzaprine (FLEXERIL) 10 MG tablet Take 1 tablet (10 mg total) by mouth 3 (three) times daily as  needed. 11/21/18   Sable Feil, PA-C  DULoxetine (CYMBALTA) 60 MG capsule Take 120 mg by mouth daily.     [provider]  gabapentin (NEURONTIN) 300 MG capsule Take 300 mg by mouth 3 (three) times daily.    [provider]  hydrOXYzine  (ATARAX/VISTARIL) 50 MG tablet Take 50 mg by mouth 3 (three) times daily.     [provider]  ketorolac (ACULAR) 0.4 % SOLN Place 1 drop into both eyes 4 (four) times daily. 07/03/19   Laban Emperor, PA-C  methocarbamol (ROBAXIN) 750 MG tablet Take 1 tablet (750 mg total) by mouth every 6 (six) hours. 08/08/18   Marin Olp, PA-C  methylPREDNISolone (MEDROL DOSEPAK) 4 MG TBPK tablet Take Tapered dose as directed 11/21/18   Sable Feil, PA-C  oxyCODONE (OXY IR/ROXICODONE) 5 MG immediate release tablet Take 1 tablet (5 mg total) by mouth every 3 (three) hours as needed for moderate pain ((score 4 to 6)). 08/08/18   Marin Olp, PA-C  phenylephrine (SUDAFED PE) 10 MG TABS tablet Take 10 mg by mouth every 4 (four) hours as needed (for nasal congestion).    [provider]  predniSONE (DELTASONE) 10 MG tablet Take 6 tablets day 1, take 5 tablets day 2, take 4 tablets day 3, take 3 tablets day 4, take 2 tablets day 5, take 1 tablet day 6 07/03/19   Laban Emperor, PA-C  ranitidine (ZANTAC) 150 MG capsule Take 150 mg by mouth 2 (two) times daily as needed for heartburn.     [provider]  tetrahydrozoline-zinc (VISINE-AC) 0.05-0.25 % ophthalmic solution Place 2 drops into both eyes 3 (three) times daily as needed. 07/03/19   Laban Emperor, PA-C  traZODone (DESYREL) 100 MG tablet Take 1 tablet (100 mg total) by mouth at bedtime. 04/19/17   Hildred Priest, MD    Allergies Aspirin, Flurbiprofen, and Nsaids  Family History  Problem Relation Age of Onset  . Heart disease Mother   . Hyperlipidemia Mother   . Hypertension Mother   . Kidney disease Mother   . Arthritis Mother   . Stroke Father   . Diabetes Father     Social History Social History   Tobacco Use  . Smoking status: Current Every Day Smoker    Packs/day: 0.25    Types: Cigarettes  . Smokeless tobacco: Never Used  . Tobacco comment: Nicotine patch was ordered  Substance Use Topics  .  Alcohol use: No  . Drug use: Not Currently    Types: Cocaine, Amphetamines, Marijuana    Comment: last 1 month ago     Review of Systems  Constitutional: No fever/chills Eyes: Positive for mild blurry vision and photophobia. No discharge. ENT: Positive for congestion and rhinorrhea. Cardiovascular: No chest pain. Respiratory: Positive for cough. No SOB. Gastrointestinal: No abdominal pain.  No nausea, no vomiting.  Musculoskeletal: Negative for musculoskeletal pain. Skin: Negative for rash, abrasions, lacerations, ecchymosis. Neurological: Negative for headaches.   ____________________________________________   PHYSICAL EXAM:  VITAL SIGNS: ED Triage Vitals  Enc Vitals Group     BP 07/03/19 1642 120/75     Pulse Rate 07/03/19 1642 67     Resp 07/03/19 1642 18     Temp 07/03/19 1642 98.3 F (36.8 C)     Temp Source 07/03/19 1642 Oral     SpO2 07/03/19 1642 99 %     Weight 07/03/19 1643 155 lb (70.3 kg)     Height 07/03/19 1643 5\' 8"  (1.727 m)  Head Circumference --      Peak Flow --      Pain Score 07/03/19 1652 9     Pain Loc --      Pain Edu? --      Excl. in GC? --      Constitutional: Alert and oriented. Well appearing and in no acute distress. Eyes: Conjunctivae are normal. PERRL. EOMI. No discharge.  No defect on fluorescein stain.  Eye pain improved with tetracaine. Wearing sunglasses.   Visual Acuity  Right Eye Distance: 20/50 Left Eye Distance:   Bilateral Distance: 20/50  Right Eye Near: R Near: 20/70 Left Eye Near:    Bilateral Near:     Head: Atraumatic. ENT: No frontal and maxillary sinus tenderness.      Ears: Tympanic membranes pearly gray with good landmarks. No discharge.      Nose: Mild congestion/rhinnorhea.      Mouth/Throat: Mucous membranes are moist. Oropharynx non-erythematous. Tonsils not enlarged. No exudates. Uvula midline. Neck: No stridor.   Hematological/Lymphatic/Immunilogical: No cervical lymphadenopathy. Cardiovascular:  Normal rate, regular rhythm.  Good peripheral circulation. Respiratory: Normal respiratory effort without tachypnea or retractions. Lungs CTAB. Good air entry to the bases with no decreased or absent breath sounds. Gastrointestinal: Bowel sounds 4 quadrants. Soft and nontender to palpation. No guarding or rigidity. No palpable masses. No distention. Musculoskeletal: Full range of motion to all extremities. No gross deformities appreciated. Neurologic:  Normal speech and language. No gross focal neurologic deficits are appreciated.  Skin:  Skin is warm, dry and intact. No rash noted. Psychiatric: Mood and affect are normal. Speech and behavior are normal. Patient exhibits appropriate insight and judgement.   ____________________________________________   LABS (all labs ordered are listed, but only abnormal results are displayed)  Labs Reviewed  NOVEL CORONAVIRUS, NAA (HOSP ORDER, SEND-OUT TO REF LAB; TAT 18-24 HRS)  CBC  BASIC METABOLIC PANEL   ____________________________________________  EKG   ____________________________________________  RADIOLOGY Lexine BatonI, Marshell Rieger, personally viewed and evaluated these images (plain radiographs) as part of my medical decision making, as well as reviewing the written report by the radiologist.  Ct Head Wo Contrast  Result Date: 07/03/2019 CLINICAL DATA:  Headache, normal neuro exam, sinus pressure and congestion and severe light sensitivity EXAM: CT HEAD WITHOUT CONTRAST TECHNIQUE: Contiguous axial images were obtained from the base of the skull through the vertex without intravenous contrast. COMPARISON:  CT 12/26/2014 FINDINGS: Brain: Extensive metallic ear piercings resultant streak artifact which degrades imaging quality particularly of the right temporal lobe. No evidence of acute infarction, hemorrhage, hydrocephalus, extra-axial collection or mass lesion/mass effect. Vascular: No hyperdense vessel or unexpected calcification. Skull: No  calvarial fracture or suspicious osseous lesion. No scalp swelling or hematoma. Sinuses/Orbits: Paranasal sinuses and mastoid air cells are predominantly clear. Included orbital structures are unremarkable. Other: Metallic ornamentation of the right auricle. IMPRESSION: 1. Extensive metallic ear piercings with resultant streak artifact which degrades imaging quality particularly of the right temporal lobe. 2. No definite acute intracranial abnormality. 3. No significant sinus disease. Electronically Signed   By: Kreg ShropshirePrice  DeHay M.D.   On: 07/03/2019 18:46   Dg Chest Portable 1 View  Result Date: 07/03/2019 CLINICAL DATA:  Cough, sinus pressure and congestion, light sensitivity EXAM: PORTABLE CHEST 1 VIEW COMPARISON:  None. FINDINGS: No consolidation, features of edema, pneumothorax, or effusion. Pulmonary vascularity is normally distributed. The cardiomediastinal contours are unremarkable. No acute osseous or soft tissue abnormality. Partially imaged cervical fixation hardware, incompletely assessed on this exam. IMPRESSION: No  acute cardiopulmonary abnormality. Electronically Signed   By: Kreg Shropshire M.D.   On: 07/03/2019 17:39    ____________________________________________    PROCEDURES  Procedure(s) performed:    Procedures    Medications  tetracaine (PONTOCAINE) 0.5 % ophthalmic solution 2 drop (2 drops Both Eyes Given 07/03/19 1822)  fluorescein ophthalmic strip 1 strip (1 strip Both Eyes Given 07/03/19 1823)  dexamethasone (DECADRON) injection 10 mg (10 mg Intravenous Given 07/03/19 1901)  ondansetron (ZOFRAN) injection 4 mg (4 mg Intravenous Given 07/03/19 1900)  diphenhydrAMINE (BENADRYL) injection 25 mg (25 mg Intravenous Given 07/03/19 1900)  SUMAtriptan (IMITREX) injection 6 mg (6 mg Subcutaneous Given 07/03/19 2022)     ____________________________________________   INITIAL IMPRESSION / ASSESSMENT AND PLAN / ED COURSE  Pertinent labs & imaging results that were  available during my care of the patient were reviewed by me and considered in my medical decision making (see chart for details).  Review of the [place] CSRS was performed in accordance of the [facility] prior to dispensing any controlled drugs.  Differential diagnosis includes, but is not limited to, intracranial hemorrhage, meningitis/encephalitis, hydrocephalous, cavernous venous thrombosis, tension headache, temporal arteritis, migraine or migraine equivalent, idiopathic intracranial hypertension, MS, and non-specific headache, conjunctivitis,  corneal abrasion, corneal ulcer, hyphema, hypopyon, retinal detachment, uveitis, scleritis glaucoma, viral illness.   Patient presented to the emergency department for evaluation of migraine, light sensitivity, nasal congestion and cough for 2 weeks. Vital signs and exam are reassuring.  Migraine completely resolved after Decadron, Benadryl, Zofran, Imitrex.  Light sensitivity resolved for about 1 hour but then returned.  Light sensitivity could be related to patient's migraines.  She will also be started on steroids for possible eye inflammatory condition.  Head CT negative for acute abnormalities.  Chest x-ray negative for acute abnormalities.  Lab work all reassuring.  Case, labs, imaging, history was discussed with Dr. Erma Heritage.  Dr. Erma Heritage is agreeable with the work-up with plans to discharge home for outpatient follow-up with ophthalmology and no further imaging.  Patient is agreeable.  COVID test was sent.  Patient feels comfortable going home. Patient will be discharged home with prescriptions for prednisone, Visine, Acular. Patient is to follow up with ophthalmology as needed or otherwise directed.  Referral was given.  Patient is given ED precautions to return to the ED for any worsening or new symptoms.   Tiffini Blacksher was evaluated in Emergency Department on 07/03/2019 for the symptoms described in the history of present illness. She was evaluated in the  context of the global COVID-19 pandemic, which necessitated consideration that the patient might be at risk for infection with the SARS-CoV-2 virus that causes COVID-19. Institutional protocols and algorithms that pertain to the evaluation of patients at risk for COVID-19 are in a state of rapid change based on information released by regulatory bodies including the CDC and federal and state organizations. These policies and algorithms were followed during the patient's care in the ED.  ____________________________________________  FINAL CLINICAL IMPRESSION(S) / ED DIAGNOSES  Final diagnoses:  Eye pain, bilateral  Other migraine without status migrainosus, not intractable      NEW MEDICATIONS STARTED DURING THIS VISIT:  ED Discharge Orders         Ordered    predniSONE (DELTASONE) 10 MG tablet  Status:  Discontinued     07/03/19 2052    tetrahydrozoline-zinc (VISINE-AC) 0.05-0.25 % ophthalmic solution  3 times daily PRN,   Status:  Discontinued     07/03/19 2052  ketorolac (ACULAR) 0.4 % SOLN  4 times daily,   Status:  Discontinued     07/03/19 2052    ketorolac (ACULAR) 0.4 % SOLN  4 times daily     07/03/19 2116    predniSONE (DELTASONE) 10 MG tablet     07/03/19 2116    tetrahydrozoline-zinc (VISINE-AC) 0.05-0.25 % ophthalmic solution  3 times daily PRN     07/03/19 2116              This chart was dictated using voice recognition software/Dragon. Despite best efforts to proofread, errors can occur which can change the meaning. Any change was purely unintentional.    Enid Derry, PA-C 07/03/19 2329    Shaune Pollack, MD 07/03/19 [phone]

## 2019-07-03 NOTE — ED Notes (Signed)
Pt alert and oriented X4, NAD noted  

## 2019-07-03 NOTE — ED Notes (Signed)
Pt eating meal tray 

## 2019-07-03 NOTE — Discharge Instructions (Signed)
Your CT and your chest x-ray were normal.  Your lab work is all reassuring.  Please take steroids for inflammation.  I have also given you a prescription for eyedrops.  Please call ophthalmology on Monday for an appointment early next week.  Please return the emergency department over the weekend for any worsening of symptoms.

## 2019-07-03 NOTE — ED Triage Notes (Signed)
Pt c/o sinus pressure and congestion, eyes are sensitive to light. Cough and congestion. Pt is in NAD.

## 2019-07-05 LAB — NOVEL CORONAVIRUS, NAA (HOSP ORDER, SEND-OUT TO REF LAB; TAT 18-24 HRS): SARS-CoV-2, NAA: NOT DETECTED

## 2019-09-08 ENCOUNTER — Emergency Department
Admission: EM | Admit: 2019-09-08 | Discharge: 2019-09-08 | Disposition: A | Discharge status: Home / Self Care | Payer: Medicare Other | Attending: Emergency Medicine | Admitting: Emergency Medicine

## 2019-09-08 ENCOUNTER — Other Ambulatory Visit: Payer: Self-pay

## 2019-09-08 ENCOUNTER — Emergency Department: Payer: Medicare Other

## 2019-09-08 DIAGNOSIS — M1711 Unilateral primary osteoarthritis, right knee: Secondary | ICD-10-CM

## 2019-09-08 DIAGNOSIS — E039 Hypothyroidism, unspecified: Secondary | ICD-10-CM | POA: Insufficient documentation

## 2019-09-08 DIAGNOSIS — Y929 Unspecified place or not applicable: Secondary | ICD-10-CM | POA: Diagnosis not present

## 2019-09-08 DIAGNOSIS — Z79899 Other long term (current) drug therapy: Secondary | ICD-10-CM | POA: Diagnosis not present

## 2019-09-08 DIAGNOSIS — Y998 Other external cause status: Secondary | ICD-10-CM | POA: Insufficient documentation

## 2019-09-08 DIAGNOSIS — S8001XA Contusion of right knee, initial encounter: Secondary | ICD-10-CM

## 2019-09-08 DIAGNOSIS — Y9389 Activity, other specified: Secondary | ICD-10-CM | POA: Insufficient documentation

## 2019-09-08 DIAGNOSIS — W010XXA Fall on same level from slipping, tripping and stumbling without subsequent striking against object, initial encounter: Secondary | ICD-10-CM | POA: Insufficient documentation

## 2019-09-08 DIAGNOSIS — M25551 Pain in right hip: Secondary | ICD-10-CM | POA: Insufficient documentation

## 2019-09-08 DIAGNOSIS — F17210 Nicotine dependence, cigarettes, uncomplicated: Secondary | ICD-10-CM | POA: Diagnosis not present

## 2019-09-08 DIAGNOSIS — S8991XA Unspecified injury of right lower leg, initial encounter: Secondary | ICD-10-CM | POA: Diagnosis present

## 2019-09-08 DIAGNOSIS — W19XXXA Unspecified fall, initial encounter: Secondary | ICD-10-CM

## 2019-09-08 MED ORDER — OXYCODONE-ACETAMINOPHEN 5-325 MG PO TABS
1.0000 | ORAL_TABLET | Freq: Once | ORAL | Status: AC
  Administered 2019-09-08: 19:00:00 1 via ORAL
  Filled 2019-09-08: qty 1

## 2019-09-08 MED ORDER — HYDROCODONE-ACETAMINOPHEN 5-325 MG PO TABS: 1.0000 | ORAL_TABLET | ORAL | 0 refills | Status: DC | PRN

## 2019-09-08 NOTE — ED Provider Notes (Signed)
[facility] Emergency Department Provider Note  ____________________________________________  Time seen: Approximately 4:20 PM  The following is a medical screening exam note. It is intended that the patient await an ER room assignment for detailed exam, diagnosis, and disposition.  I have reviewed the triage vital signs.    HISTORY  Chief Complaint No chief complaint on file.   HPI Mercedes Forbes is a 48 y.o. female presents to the emergency department after fall prior to arrival. She feels that her right knee gave out. She now has right hip and knee pain. She has been treated by Dr. Marry Guan recently for hip and knee pain. No alleviating measures prior to arrival.  Past Medical History:  Diagnosis Date  . Allergy   . Anxiety   . Arthritis   . Bipolar 1 disorder ([place])   . Depression   . GERD (gastroesophageal reflux disease)   . Headache    migraines  . Personality disorder ([place])   . Polysubstance abuse ([place])   . Schizophrenia [facility])     Patient Active Problem List   Diagnosis Date Noted  . Cervical radiculopathy 08/06/2018  . Chronic upper extremity pain (Secondary Area of Pain) (Bilateral) (R>L) 07/28/2018  . DDD (degenerative disc disease), cervical 07/28/2018  . Chronic neck pain (Primary Area of Pain) (Bilateral) (R>L) 07/15/2018  . Chronic knee pain [facility] Area of Pain) (Right) 07/15/2018  . Chronic low back pain ([name]) (Bilateral) (R>L) w/ sciatica (Bilateral) 07/15/2018  . Chronic lower extremity pain (Bilateral) (R>L) 07/15/2018  . Chronic hand pain (Bilateral) (L>R) 07/15/2018  . Chronic pain syndrome 07/15/2018  . Long term current use of opiate analgesic 07/15/2018  . Pharmacologic therapy 07/15/2018  . Disorder of skeletal system 07/15/2018  . Problems influencing health status 07/15/2018  . Lower extremity weakness 07/15/2018  . Arm numbness 04/21/2018  . Amphetamine abuse ([place]) 04/15/2017  . Hypothyroidism  03/08/2017  . Opiate abuse, episodic ([place]) 07/22/2016  . PTSD (post-traumatic stress disorder) 04/04/2016  . Tobacco use disorder 04/03/2016  . Bipolar 2 disorder, major depressive episode ([place]) 04/02/2016  . Cocaine use disorder, moderate, dependence ([place]) 04/02/2016  . Cannabis use disorder, moderate, dependence ([place]) 04/02/2016    Past Surgical History:  Procedure Laterality Date  . ANTERIOR CERVICAL DECOMPRESSION/DISCECTOMY FUSION 4 LEVELS N/A 08/06/2018   Procedure: ANTERIOR CERVICAL DECOMPRESSION/DISCECTOMY FUSION 4 LEVELS-c3-7;  Surgeon: Meade Maw, MD;  Location: [facility] ORS;  Service: Neurosurgery;  Laterality: N/A;  . KNEE ARTHROSCOPY WITH PATELLA RECONSTRUCTION     left patella removed  . wrists Bilateral     Prior to Admission medications   Medication Sig Start Date End Date Taking? Authorizing Provider  acetaminophen (TYLENOL) 650 MG CR tablet Take 1,300 mg by mouth every 8 (eight) hours as needed for pain.    [provider]  busPIRone (BUSPAR) 30 MG tablet Take 30 mg by mouth 3 (three) times daily.     [provider]  cyclobenzaprine (FLEXERIL) 10 MG tablet Take 1 tablet (10 mg total) by mouth 3 (three) times daily as needed. 11/21/18   Sable Feil, PA-C  DULoxetine (CYMBALTA) 60 MG capsule Take 120 mg by mouth daily.     [provider]  gabapentin (NEURONTIN) 300 MG capsule Take 300 mg by mouth 3 (three) times daily.    [provider]  hydrOXYzine (ATARAX/VISTARIL) 50 MG tablet Take 50 mg by mouth 3 (three) times daily.     [provider]  ketorolac (ACULAR) 0.4 %  SOLN Place 1 drop into both eyes 4 (four) times daily. 07/03/19   Enid Derry, PA-C  methocarbamol (ROBAXIN) 750 MG tablet Take 1 tablet (750 mg total) by mouth every 6 (six) hours. 08/08/18   Ivar Drape, PA-C  methylPREDNISolone (MEDROL DOSEPAK) 4 MG TBPK tablet Take Tapered dose as directed 11/21/18   Joni Reining, PA-C  oxyCODONE (OXY  IR/ROXICODONE) 5 MG immediate release tablet Take 1 tablet (5 mg total) by mouth every 3 (three) hours as needed for moderate pain ((score 4 to 6)). 08/08/18   Ivar Drape, PA-C  phenylephrine (SUDAFED PE) 10 MG TABS tablet Take 10 mg by mouth every 4 (four) hours as needed (for nasal congestion).    [provider]  predniSONE (DELTASONE) 10 MG tablet Take 6 tablets day 1, take 5 tablets day 2, take 4 tablets day 3, take 3 tablets day 4, take 2 tablets day 5, take 1 tablet day 6 07/03/19   Enid Derry, PA-C  ranitidine (ZANTAC) 150 MG capsule Take 150 mg by mouth 2 (two) times daily as needed for heartburn.     [provider]  tetrahydrozoline-zinc (VISINE-AC) 0.05-0.25 % ophthalmic solution Place 2 drops into both eyes 3 (three) times daily as needed. 07/03/19   Enid Derry, PA-C  traZODone (DESYREL) 100 MG tablet Take 1 tablet (100 mg total) by mouth at bedtime. 04/19/17   [name], MD    Allergies Aspirin, Flurbiprofen, and Nsaids  Family History  Problem Relation Age of Onset  . Heart disease Mother   . Hyperlipidemia Mother   . Hypertension Mother   . Kidney disease Mother   . Arthritis Mother   . Stroke Father   . Diabetes Father     Social History Social History   Tobacco Use  . Smoking status: Current Every Day Smoker    Packs/day: 0.25    Types: Cigarettes  . Smokeless tobacco: Never Used  . Tobacco comment: Nicotine patch was ordered  Substance Use Topics  . Alcohol use: No  . Drug use: Not Currently    Types: Cocaine, Amphetamines, Marijuana    Comment: last 1 month ago    Review of Systems Constitutional: Negative for fever. ENT: Negative for sore throat. Respiratory: Negative for cough Gastrointestinal: No abdominal pain.  No nausea, no vomiting.  No diarrhea.  Musculoskeletal: Positive for right hip and right knee pain. Skin: Negative for rash/lesion/wound. Neurological: Negative for headaches, focal weakness or  numbness.  ____________________________________________   PHYSICAL EXAM:  VITAL SIGNS:  Today's Vitals   09/08/19 1621 09/08/19 1622 09/08/19 1623  BP:   (!) 114/95  Pulse: 79    Resp: 16    Temp: 98.5 F (36.9 C)    TempSrc: Oral    SpO2: 98%    Weight:  72.6 kg   Height:  5\' 8"  (1.727 m)   PainSc:  9     Body mass index is 24.33 kg/m.   Constitutional: Alert and oriented. No acute distress. Head: Atraumatic. Nose: No congestion/rhinnorhea. Mouth/Throat: Mucous membranes are moist. Neck: No stridor.  Cardiovascular: Good peripheral circulation. Respiratory: Normal respiratory effort. Musculoskeletal: No obvious deformity. Neurologic:  Normal speech and language. No gross focal neurologic deficits are appreciated. Speech is normal. No gait instability. Skin:  Skin is warm, dry and intact. No rash noted. Psychiatric: Mood and affect are normal. Speech and behavior are normal.  ____________________________________________   LABS (all labs ordered are listed, but only abnormal results are displayed)  Labs Reviewed -  No data to display ____________________________________________  EKG  Not indicated ____________________________________________   INITIAL CLINICAL IMPRESSION(S)   Hip contusion/knee strain.  Patient was advised to await ER bed assignment.  Patient requested pain medication while waiting, I offered her Tylenol.  She declined.    Chinita Pesterriplett, Rebakah Cokley B, FNP 09/08/19 1703    Phineas SemenGoodman, [place], MD 09/08/19 [phone]

## 2019-09-08 NOTE — ED Notes (Signed)
See triage note  Presents s/p fall  States she just fell  Landed on right side  having pain between hip and knee on the right  Unable to bear wt  Good pulses

## 2019-09-08 NOTE — ED Provider Notes (Signed)
-----------------------------------------   6:07 PM on 09/08/2019 -----------------------------------------  Blood pressure (!) 114/95, pulse 79, temperature 98.5 F (36.9 C), temperature source Oral, resp. rate 16, height 5\' 8"  (1.727 m), weight 72.6 kg, SpO2 98 %.  Assuming care from [name], [place].  In short, Mercedes Forbes is a 48 y.o. female with a chief complaint of Knee Pain and Hip Pain .  Refer to the original H&P for additional details.  The current plan of care is to proceed with imaging of the right hip and knee.  Patient presented to the emergency department complaining of an increased amount of pain to the right hip and knee.  Patient is already seen neurosurgery, orthopedics for ongoing back, hip and knee pain.  Patient states that today she sustained a fall landing on her right hip and knee.  No medications prior to arrival.  No other injury or complaint.   Imaging results:  EXAM: RIGHT KNEE - COMPLETE 4+ VIEW  COMPARISON:  None.  FINDINGS: Moderate tricompartment degenerative changes with joint space narrowing and spurring. No joint effusion. No acute bony abnormality. Specifically, no fracture, subluxation, or dislocation.  IMPRESSION: Tricompartment degenerative changes.  No acute bony abnormality.   DG HIP (WITH OR WITHOUT PELVIS) 2-3V RIGHT  COMPARISON:  None.  FINDINGS: There is no evidence of hip fracture or dislocation. There is no evidence of arthropathy or other focal bone abnormality.  IMPRESSION: Negative exam.    Repeat examination of the patient's knee in the room is reassuring.  No visible or palpable abnormality about the knee.  Good range of motion at this time.  Varus, valgus, Lachman's and McMurray's is negative.  Dorsalis pedis pulses sensation intact distally.  At this time with imaging, physical exam findings, contusion of the right knee exacerbating a chronic problem.  Patient already has anti-inflammatories at home which  she may use.  I will prescribe very limited prescription of pain medication in addition to her anti-inflammatories.  Follow-up with orthopedics.   Diagnosis:  Fall Knee contusion   New medications: Vicodin  #8     [name] 09/08/19 [name], MD 09/09/19 [phone]

## 2019-09-08 NOTE — ED Triage Notes (Signed)
Pt fell today. States "I just fell" c/o of R hip and R knee pain. A&O, in wheelchair.

## 2019-12-25 ENCOUNTER — Emergency Department
Admission: EM | Admit: 2019-12-25 | Discharge: 2019-12-25 | Disposition: A | Discharge status: Home / Self Care | Payer: Medicare Other | Attending: Emergency Medicine | Admitting: Emergency Medicine

## 2019-12-25 ENCOUNTER — Encounter: Payer: Self-pay | Admitting: *Deleted

## 2019-12-25 ENCOUNTER — Other Ambulatory Visit: Payer: Self-pay

## 2019-12-25 DIAGNOSIS — Z79899 Other long term (current) drug therapy: Secondary | ICD-10-CM | POA: Insufficient documentation

## 2019-12-25 DIAGNOSIS — R05 Cough: Secondary | ICD-10-CM | POA: Diagnosis not present

## 2019-12-25 DIAGNOSIS — R519 Headache, unspecified: Secondary | ICD-10-CM | POA: Diagnosis not present

## 2019-12-25 DIAGNOSIS — Z1152 Encounter for screening for COVID-19: Secondary | ICD-10-CM

## 2019-12-25 DIAGNOSIS — R0981 Nasal congestion: Secondary | ICD-10-CM | POA: Insufficient documentation

## 2019-12-25 DIAGNOSIS — F17210 Nicotine dependence, cigarettes, uncomplicated: Secondary | ICD-10-CM | POA: Diagnosis not present

## 2019-12-25 DIAGNOSIS — R059 Cough, unspecified: Secondary | ICD-10-CM

## 2019-12-25 DIAGNOSIS — M791 Myalgia, unspecified site: Secondary | ICD-10-CM | POA: Insufficient documentation

## 2019-12-25 DIAGNOSIS — L989 Disorder of the skin and subcutaneous tissue, unspecified: Secondary | ICD-10-CM

## 2019-12-25 DIAGNOSIS — E039 Hypothyroidism, unspecified: Secondary | ICD-10-CM | POA: Diagnosis not present

## 2019-12-25 DIAGNOSIS — Z20822 Contact with and (suspected) exposure to covid-19: Secondary | ICD-10-CM | POA: Insufficient documentation

## 2019-12-25 MED ORDER — SULFAMETHOXAZOLE-TRIMETHOPRIM 800-160 MG PO TABS: 1.0000 | ORAL_TABLET | Freq: Two times a day (BID) | ORAL | 0 refills | Status: DC

## 2019-12-25 MED ORDER — ACETAMINOPHEN 325 MG PO TABS
650.0000 mg | ORAL_TABLET | Freq: Once | ORAL | Status: AC
  Administered 2019-12-25: 650 mg via ORAL
  Filled 2019-12-25: qty 2

## 2019-12-25 NOTE — Discharge Instructions (Signed)
Follow-up with your primary care provider if any continued problems or concerns.  A Covid test was done to rule out infection since you complained of coughing.  The lesion that was evaluated while in the ED today looks like it is already open and draining.  Continue using warm compresses to the area frequently.  A prescription for Bactrim DS was sent to the pharmacy begin taking this twice a day for 10 days.  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  You may take Tylenol or ibuprofen with this medication if needed for additional pain medication.

## 2019-12-25 NOTE — ED Notes (Signed)
See triage note  Presents with possible insect bite to right upper thigh  States she noticed area about 2 weeks ago    Min drainage noted

## 2019-12-25 NOTE — ED Triage Notes (Addendum)
Per patient's report, patient has a spider on right upper thigh for two weeks. Area is a circle approximately 1cm in diameter with slough and minimal drainage noted on band-aid. No redness or swelling noted around wound. Patient also c/o nasal congestion and headache.

## 2019-12-25 NOTE — ED Notes (Signed)
Pad not working. Pt verbalized understanding of d/c instructions.

## 2019-12-25 NOTE — ED Provider Notes (Signed)
[facility] Emergency Department Provider Note  ____________________________________________   First MD Initiated Contact with Patient 12/25/19 1420     (approximate)  I have reviewed the triage vital signs and the nursing notes.   HISTORY  Chief Complaint Insect Bite   HPI Mercedes Forbes is a 49 y.o. female presents to the ED with complaint of possible insect bite to her right upper thigh area.  Patient states she noticed this approximately 2 weeks ago and there has been minimal drainage from the area.  Patient states she has been using warm compresses.  Patient does have a history of MRSA.  She also has complaint of nasal congestion, cough, headache for several days.  Patient states that she is unaware of any exposure to Covid.  She denies any fever but endorses body aches.  Patient rates her pain as 9 out of 10.       Past Medical History:  Diagnosis Date  . Allergy   . Anxiety   . Arthritis   . Bipolar 1 disorder ([place])   . Depression   . GERD (gastroesophageal reflux disease)   . Headache    migraines  . Personality disorder ([place])   . Polysubstance abuse ([place])   . Schizophrenia [facility])     Patient Active Problem List   Diagnosis Date Noted  . Cervical radiculopathy 08/06/2018  . Chronic upper extremity pain (Secondary Area of Pain) (Bilateral) (R>L) 07/28/2018  . DDD (degenerative disc disease), cervical 07/28/2018  . Chronic neck pain (Primary Area of Pain) (Bilateral) (R>L) 07/15/2018  . Chronic knee pain [facility] Area of Pain) (Right) 07/15/2018  . Chronic low back pain ([name]) (Bilateral) (R>L) w/ sciatica (Bilateral) 07/15/2018  . Chronic lower extremity pain (Bilateral) (R>L) 07/15/2018  . Chronic hand pain (Bilateral) (L>R) 07/15/2018  . Chronic pain syndrome 07/15/2018  . Long term current use of opiate analgesic 07/15/2018  . Pharmacologic therapy 07/15/2018  . Disorder of skeletal system 07/15/2018  . Problems  influencing health status 07/15/2018  . Lower extremity weakness 07/15/2018  . Arm numbness 04/21/2018  . 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 Hypothyroidism 03/08/2017  . Opiate abuse, episodic ([place]) 07/22/2016  . PTSD (post-traumatic stress disorder) 04/04/2016  . Tobacco use disorder 04/03/2016  . Bipolar 2 disorder, major depressive episode ([place]) 04/02/2016  . Cocaine use disorder, moderate, dependence ([place]) 04/02/2016  . Cannabis use disorder, moderate, dependence ([place]) 04/02/2016    Past Surgical History:  Procedure Laterality Date  . ANTERIOR CERVICAL DECOMPRESSION/DISCECTOMY FUSION 4 LEVELS N/A 08/06/2018   Procedure: ANTERIOR CERVICAL DECOMPRESSION/DISCECTOMY FUSION 4 LEVELS-c3-7;  Surgeon: Meade Maw, MD;  Location: [facility] ORS;  Service: Neurosurgery;  Laterality: N/A;  . KNEE ARTHROSCOPY WITH PATELLA RECONSTRUCTION     left patella removed  . wrists Bilateral     Prior to Admission medications   Medication Sig Start Date End Date Taking? Authorizing Provider  acetaminophen (TYLENOL) 650 MG CR tablet Take 1,300 mg by mouth every 8 (eight) hours as needed for pain.    [provider]  busPIRone (BUSPAR) 30 MG tablet Take 30 mg by mouth 3 (three) times daily.     [provider]  DULoxetine (CYMBALTA) 60 MG capsule Take 120 mg by mouth daily.     [provider]  gabapentin (NEURONTIN) 300 MG capsule Take 300 mg by mouth 3 (three) times daily.    [provider]  hydrOXYzine (ATARAX/VISTARIL) 50 MG tablet Take 50 mg by mouth 3 (three)  times daily.     [provider]  phenylephrine (SUDAFED PE) 10 MG TABS tablet Take 10 mg by mouth every 4 (four) hours as needed (for nasal congestion).    [provider]  ranitidine (ZANTAC) 150 MG capsule Take 150 mg by mouth 2 (two) times daily as needed for heartburn.     [provider]  sulfamethoxazole-trimethoprim (BACTRIM DS) 800-160 MG tablet Take 1 tablet by mouth 2  (two) times daily. 12/25/19   Tommi Rumps, PA-C  traZODone (DESYREL) 100 MG tablet Take 1 tablet (100 mg total) by mouth at bedtime. 04/19/17   [name], MD    Allergies Aspirin, Flurbiprofen, and Nsaids  Family History  Problem Relation Age of Onset  . Heart disease Mother   . Hyperlipidemia Mother   . Hypertension Mother   . Kidney disease Mother   . Arthritis Mother   . Stroke Father   . Diabetes Father     Social History Social History   Tobacco Use  . Smoking status: Current Every Day Smoker    Packs/day: 0.25    Types: Cigarettes  . Smokeless tobacco: Never Used  . Tobacco comment: Nicotine patch was ordered  Substance Use Topics  . Alcohol use: No  . Drug use: Not Currently    Types: Cocaine, Amphetamines, Marijuana    Comment: last 1 month ago    Review of Systems Constitutional: No fever/chills Eyes: No visual changes. ENT: Positive nasal congestion. Cardiovascular: Denies chest pain. Respiratory: Denies shortness of breath.  Positive for cough. Gastrointestinal: No abdominal pain.  No nausea, no vomiting.  Genitourinary: Negative for dysuria. Musculoskeletal: Positive for muscle aches. Skin: Negative for rash. Neurological: Positive for headaches, negative for focal weakness or numbness. ____________________________________________   PHYSICAL EXAM:  VITAL SIGNS: ED Triage Vitals  Enc Vitals Group     BP 12/25/19 1325 124/77     Pulse Rate 12/25/19 1325 64     Resp 12/25/19 1325 20     Temp 12/25/19 1325 98.2 F (36.8 C)     Temp Source 12/25/19 1325 Oral     SpO2 12/25/19 1325 100 %     Weight 12/25/19 1325 145 lb (65.8 kg)     Height 12/25/19 1325 5\' 8"  (1.727 m)     Head Circumference --      Peak Flow --      Pain Score 12/25/19 1339 9     Pain Loc --      Pain Edu? --      Excl. in GC? --     Constitutional: Alert and oriented. Well appearing and in no acute distress. Eyes: Conjunctivae are normal.  Head:  Atraumatic. Nose: No congestion/rhinnorhea. Neck: No stridor.   Cardiovascular: Normal rate, regular rhythm. Grossly normal heart sounds.  Good peripheral circulation. Respiratory: Normal respiratory effort.  No retractions. Lungs CTAB. Gastrointestinal: Soft and nontender. No distention Musculoskeletal: Moves upper and lower extremities that any difficulty.  Normal gait was noted. Neurologic:  Normal speech and language. No gross focal neurologic deficits are appreciated. No gait instability. Skin:  Skin is warm, dry.  Patient has a 1 cm open lesion to the right upper thigh medial aspect without active draining at this time but lesion is open.  No surrounding cellulitis is present.  Area is markedly tender to palpation. Psychiatric: Mood and affect are normal. Speech and behavior are normal.  ____________________________________________   LABS (all labs ordered are listed, but only abnormal results are displayed)  Labs Reviewed  SARS CORONAVIRUS 2 (TAT 6-24 HRS)     PROCEDURES  Procedure(s) performed (including Critical Care):  Procedures   ____________________________________________   INITIAL IMPRESSION / ASSESSMENT AND PLAN / ED COURSE  As part of my medical decision making, I reviewed the following data within the electronic MEDICAL RECORD NUMBER Notes from prior ED visits and [place] Controlled Substance Database  49 year old female presents to the ED with possible insect bite to her right upper thigh.  Patient states she noticed this approximately 2 weeks ago and has had minimal drainage from the area.  She also today complains of nasal congestion, headache, body aches and cough.  She denies any known exposure to Covid.  A Covid swab was ordered.  Bactrim DS was sent to the pharmacy for her to begin taking as she has a history of MRSA.  She is encouraged to use warm compresses to her leg frequently to encourage any drainage to continue.  Patient is to follow-up with her PCP if any  continued problems. ____________________________________________   FINAL CLINICAL IMPRESSION(S) / ED DIAGNOSES  Final diagnoses:  Skin lesion of right lower extremity  Cough  Encounter for screening for COVID-19     ED Discharge Orders         Ordered    sulfamethoxazole-trimethoprim (BACTRIM DS) 800-160 MG tablet  2 times daily     12/25/19 1445           Note:  This document was prepared using Dragon voice recognition software and may include unintentional dictation errors.    Tommi Rumps, PA-C 12/25/19 1454    Sharyn Creamer, MD 12/25/19 1530

## 2019-12-26 LAB — SARS CORONAVIRUS 2 (TAT 6-24 HRS): SARS Coronavirus 2: NEGATIVE

## 2020-02-29 ENCOUNTER — Other Ambulatory Visit: Payer: Self-pay | Admitting: Nurse Practitioner

## 2020-02-29 DIAGNOSIS — Z981 Arthrodesis status: Secondary | ICD-10-CM

## 2020-03-11 ENCOUNTER — Other Ambulatory Visit: Payer: Self-pay

## 2020-03-11 ENCOUNTER — Emergency Department: Payer: Medicare Other

## 2020-03-11 ENCOUNTER — Emergency Department
Admission: EM | Admit: 2020-03-11 | Discharge: 2020-03-11 | Disposition: A | Discharge status: Home / Self Care | Payer: Medicare Other | Attending: Emergency Medicine | Admitting: Emergency Medicine

## 2020-03-11 DIAGNOSIS — F1220 Cannabis dependence, uncomplicated: Secondary | ICD-10-CM | POA: Insufficient documentation

## 2020-03-11 DIAGNOSIS — F1420 Cocaine dependence, uncomplicated: Secondary | ICD-10-CM | POA: Insufficient documentation

## 2020-03-11 DIAGNOSIS — L03211 Cellulitis of face: Secondary | ICD-10-CM | POA: Diagnosis not present

## 2020-03-11 DIAGNOSIS — L03213 Periorbital cellulitis: Secondary | ICD-10-CM | POA: Insufficient documentation

## 2020-03-11 DIAGNOSIS — R600 Localized edema: Secondary | ICD-10-CM | POA: Diagnosis present

## 2020-03-11 DIAGNOSIS — F17210 Nicotine dependence, cigarettes, uncomplicated: Secondary | ICD-10-CM | POA: Diagnosis not present

## 2020-03-11 LAB — CBC WITH DIFFERENTIAL/PLATELET
Abs Immature Granulocytes: 0.05 10*3/uL (ref 0.00–0.07)
Basophils Absolute: 0.1 10*3/uL (ref 0.0–0.1)
Basophils Relative: 1 %
Eosinophils Absolute: 0.2 10*3/uL (ref 0.0–0.5)
Eosinophils Relative: 1 %
HCT: 38.4 % (ref 36.0–46.0)
Hemoglobin: 12.8 g/dL (ref 12.0–15.0)
Immature Granulocytes: 0 %
Lymphocytes Relative: 30 %
Lymphs Abs: 4.1 10*3/uL — ABNORMAL HIGH (ref 0.7–4.0)
MCH: 28.5 pg (ref 26.0–34.0)
MCHC: 33.3 g/dL (ref 30.0–36.0)
MCV: 85.5 fL (ref 80.0–100.0)
Monocytes Absolute: 1 10*3/uL (ref 0.1–1.0)
Monocytes Relative: 7 %
Neutro Abs: 8.4 10*3/uL — ABNORMAL HIGH (ref 1.7–7.7)
Neutrophils Relative %: 61 %
Platelets: 365 10*3/uL (ref 150–400)
RBC: 4.49 MIL/uL (ref 3.87–5.11)
RDW: 14.1 % (ref 11.5–15.5)
Smear Review: NORMAL
WBC: 13.8 10*3/uL — ABNORMAL HIGH (ref 4.0–10.5)
nRBC: 0 % (ref 0.0–0.2)

## 2020-03-11 LAB — BASIC METABOLIC PANEL
Anion gap: 10 (ref 5–15)
BUN: 7 mg/dL (ref 6–20)
CO2: 25 mmol/L (ref 22–32)
Calcium: 9.2 mg/dL (ref 8.9–10.3)
Chloride: 101 mmol/L (ref 98–111)
Creatinine, Ser: 0.9 mg/dL (ref 0.44–1.00)
GFR calc Af Amer: >60 mL/min (ref >60)
GFR calc non Af Amer: >60 mL/min (ref >60)
Glucose, Bld: 76 mg/dL (ref 70–99)
Potassium: 3.5 mmol/L (ref 3.5–5.1)
Sodium: 136 mmol/L (ref 135–145)

## 2020-03-11 MED ORDER — SODIUM CHLORIDE 0.9 % IV SOLN
1.0000 g | Freq: Once | INTRAVENOUS | Status: AC
  Administered 2020-03-11: 1 g via INTRAVENOUS
  Filled 2020-03-11: qty 10

## 2020-03-11 MED ORDER — OXYCODONE-ACETAMINOPHEN 5-325 MG PO TABS
1.0000 | ORAL_TABLET | Freq: Once | ORAL | Status: AC
  Administered 2020-03-11: 1 via ORAL
  Filled 2020-03-11: qty 1

## 2020-03-11 MED ORDER — CLINDAMYCIN HCL 300 MG PO CAPS: 300.0000 mg | ORAL_CAPSULE | Freq: Three times a day (TID) | ORAL | 0 refills | Status: AC

## 2020-03-11 MED ORDER — SODIUM CHLORIDE 0.9 % IV BOLUS
500.0000 mL | Freq: Once | INTRAVENOUS | Status: AC
  Administered 2020-03-11: 500 mL via INTRAVENOUS

## 2020-03-11 MED ORDER — CLINDAMYCIN PHOSPHATE 600 MG/50ML IV SOLN
600.0000 mg | Freq: Once | INTRAVENOUS | Status: AC
  Administered 2020-03-11: 600 mg via INTRAVENOUS
  Filled 2020-03-11: qty 50

## 2020-03-11 MED ORDER — IOHEXOL 300 MG/ML  SOLN
75.0000 mL | Freq: Once | INTRAMUSCULAR | Status: AC | PRN
  Administered 2020-03-11: 75 mL via INTRAVENOUS
  Filled 2020-03-11: qty 75

## 2020-03-11 MED ORDER — AMOXICILLIN-POT CLAVULANATE 875-125 MG PO TABS: 1.0000 | ORAL_TABLET | Freq: Two times a day (BID) | ORAL | 0 refills | Status: AC

## 2020-03-11 NOTE — Discharge Instructions (Signed)
OPTIONS FOR DENTAL FOLLOW UP CARE ° °[place] Department of Health and Human Services - Local Safety Net Dental Clinics °http://www.ncdhhs.gov/dph/oralhealth/services/safetynetclinics.htm °  °[facility] (336-562-3123) ° °[facility] [place] (919-933-9087) ° °[facility] ([phone] ext [phone]) ° °[facility] ([phone]) ° °[facility] ([phone]) °This clinic caters to the indigent population and is on a lottery system. °Location: °[facility], [address], [place] °Clinic Hours: °Wednesdays from 6pm - 9pm, patients seen by a lottery system. °For dates, call or go to [email or web address] °Services: °Cleanings, fillings and simple extractions. °Payment Options: °DENTAL WORK IS FREE OF CHARGE. Bring proof of income or support. °Best way to get seen: °Arrive at 5:15 pm - this is a lottery, NOT first come/first serve, so arriving earlier will not increase your chances of being seen. °  °  °[facility] °[phone] °Select option 1 for emergencies °  °Location: °[facility], [address], [place] °Clinic Hours: °No walk-ins accepted - call the day before to schedule an appointment. °Check in times are 9:30 am and 1:30 pm. °Services: °Simple extractions, temporary fillings, pulpectomy/pulp debridement, uncomplicated abscess drainage. °Payment Options: °PAYMENT IS DUE AT THE TIME OF SERVICE.  Fee is usually $100-200, additional surgical procedures (e.g. abscess drainage) may be extra. °Cash, checks, Visa/MasterCard accepted.  Can file Medicaid if patient is covered for dental - patient should call case worker to check. °No discount for [facility] patients. °Best way to get seen: °MUST call the day before and get onto the schedule. Can usually be seen the next 1-2 days. No walk-ins accepted. °  °  °[facility] °[phone] °   °Location: °[facility], [address], [place] °Clinic Hours: °M, W, Th, F 8am or 1:30pm, Tues 9a or 1:30 - first come/first served. °Services: °Simple extractions, temporary fillings, uncomplicated abscess drainage.  You do not need to be an [place] resident. °Payment Options: °PAYMENT IS DUE AT THE TIME OF SERVICE. °Dental insurance, otherwise sliding scale - bring proof of income or support. °Depending on income and treatment needed, cost is usually $50-200. °Best way to get seen: °Arrive early as it is first come/first served. °  °  °[facility] °[phone] °  °Location: °[address] °Clinic Hours: °Mon-Thu 8a-5p °Services: °Most basic dental services including extractions and fillings. °Payment Options: °PAYMENT IS DUE AT THE TIME OF SERVICE. °Sliding scale, up to 50% off - bring proof if income or support. °Medicaid with dental option accepted. °Best way to get seen: °Call to schedule an appointment, can usually be seen within 2 weeks OR they will try to see walk-ins - show up at 8a or 2p (you may have to wait). °  °  °[facility] °[phone] °[place] RESIDENTS ONLY °  °Location: °[facility], [address], [place], [place] [address] °Clinic Hours: By appointment only. °Monday - Thursday 8am-5pm, Friday 8am-12pm °Services: Cleanings, fillings, extractions. °Payment Options: °PAYMENT IS DUE AT THE TIME OF SERVICE. °Cash, Visa or MasterCard. Sliding scale - $30 minimum per service. °Best way to get seen: °Come in to office, complete packet and make an appointment - need proof of income °or support monies for each household member and proof of [place] residence. °Usually takes about a month to get in. °  °  °[facility] °[phone] °  °Location: °[address]   [place] °Clinic Hours: Walk-in Urgent Care Dental Services are offered Monday-Friday  mornings only. °The numbers of emergencies accepted daily is limited to the number of °providers available. °Maximum 15 - Mondays, Wednesdays & Thursdays °Maximum 10 - Tuesdays & Fridays °Services: °You do not need to be a [place] resident to be seen for a dental emergency. °Emergencies are defined as pain, swelling, abnormal bleeding, or dental trauma. Walkins will receive x-rays if needed. °NOTE: Dental cleaning is not an emergency. °Payment Options: °PAYMENT IS DUE AT THE TIME OF SERVICE. °Minimum co-pay is $40.00 for uninsured patients. °Minimum co-pay is $3.00 for Medicaid with dental coverage. °Dental Insurance is accepted and must be presented at time of visit. °Medicare does not cover dental. °Forms of payment: Cash, credit card, checks. °Best way to get seen: °If not previously registered with the clinic, walk-in dental registration begins at 7:15 am and is on a first come/first serve basis. °If previously registered with the clinic, call to make an appointment. °  °  °[facility] °[phone] °[place] RESIDENTS ONLY °  °Location: °[address], [place],  °Clinic Hours: °Mon-Thu 10a-2p °Services: Extractions only! °Payment Options: °FREE (donations accepted) - bring proof of income or support °Best way to get seen: °Call and schedule an appointment OR come at 8am on the 1st Monday of every month (except for holidays) when it is first come/first served. °  °  °[facility] °[phone] °  °Location: °[address], [place] °Clinic Hours: °Friday mornings °Services, Payment Options, Best way to get seen: °Call for info °

## 2020-03-11 NOTE — ED Provider Notes (Signed)
[facility] Emergency Department Provider Note  ____________________________________________  Time seen: Approximately 1:52 PM  I have reviewed the triage vital signs and the nursing notes.   HISTORY  Chief Complaint Dental Pain and Oral Swelling    HPI Mercedes Forbes is a 49 y.o. female that presents to the emergency department for evaluation of left-sided cheek swelling for 2 days.  Patient states that the right side of her face was swelling 2 weeks ago and she took her dog's Keflex prescription, which resolved the swelling.  A couple of days ago, the left side of her face started swelling.  Patient states that she has poor dentition but does not specifically have any painful teeth.  She does currently have a headache.  No fevers.   Past Medical History:  Diagnosis Date  . Allergy   . Anxiety   . Arthritis   . Bipolar 1 disorder (HCC)   . Depression   . GERD (gastroesophageal reflux disease)   . Headache    migraines  . Personality disorder (HCC)   . Polysubstance abuse (HCC)   . Schizophrenia [facility])     Patient Active Problem List   Diagnosis Date Noted  . Cervical radiculopathy 08/06/2018  . Chronic upper extremity pain (Secondary Area of Pain) (Bilateral) (R>L) 07/28/2018  . DDD (degenerative disc disease), cervical 07/28/2018  . Chronic neck pain (Primary Area of Pain) (Bilateral) (R>L) 07/15/2018  . Chronic knee pain [facility] Area of Pain) (Right) 07/15/2018  . Chronic low back pain ([name]) (Bilateral) (R>L) w/ sciatica (Bilateral) 07/15/2018  . Chronic lower extremity pain (Bilateral) (R>L) 07/15/2018  . Chronic hand pain (Bilateral) (L>R) 07/15/2018  . Chronic pain syndrome 07/15/2018  . Long term current use of opiate analgesic 07/15/2018  . Pharmacologic therapy 07/15/2018  . Disorder of skeletal system 07/15/2018  . Problems influencing health status 07/15/2018  . Lower extremity weakness 07/15/2018  . Arm numbness  04/21/2018  . 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 Hypothyroidism 03/08/2017  . Opiate abuse, episodic (HCC) 07/22/2016  . PTSD (post-traumatic stress disorder) 04/04/2016  . Tobacco use disorder 04/03/2016  . Bipolar 2 disorder, major depressive episode (HCC) 04/02/2016  . Cocaine use disorder, moderate, dependence (HCC) 04/02/2016  . Cannabis use disorder, moderate, dependence (HCC) 04/02/2016    Past Surgical History:  Procedure Laterality Date  . ANTERIOR CERVICAL DECOMPRESSION/DISCECTOMY FUSION 4 LEVELS N/A 08/06/2018   Procedure: ANTERIOR CERVICAL DECOMPRESSION/DISCECTOMY FUSION 4 LEVELS-c3-7;  Surgeon: Venetia Night, MD;  Location: [facility] ORS;  Service: Neurosurgery;  Laterality: N/A;  . KNEE ARTHROSCOPY WITH PATELLA RECONSTRUCTION     left patella removed  . wrists Bilateral     Prior to Admission medications   Medication Sig Start Date End Date Taking? Authorizing Provider  acetaminophen (TYLENOL) 650 MG CR tablet Take 1,300 mg by mouth every 8 (eight) hours as needed for pain.    [provider]  amoxicillin-clavulanate (AUGMENTIN) 875-125 MG tablet Take 1 tablet by mouth 2 (two) times daily for 10 days. 03/11/20 03/21/20  Enid Derry, PA-C  busPIRone (BUSPAR) 30 MG tablet Take 30 mg by mouth 3 (three) times daily.     [provider]  clindamycin (CLEOCIN) 300 MG capsule Take 1 capsule (300 mg total) by mouth 3 (three) times daily for 10 days. 03/11/20 03/21/20  Enid Derry, PA-C  DULoxetine (CYMBALTA) 60 MG capsule Take 120 mg by mouth daily.     [provider]  gabapentin (NEURONTIN) 300 MG capsule Take 300  mg by mouth 3 (three) times daily.    [provider]  hydrOXYzine (ATARAX/VISTARIL) 50 MG tablet Take 50 mg by mouth 3 (three) times daily.     [provider]  phenylephrine (SUDAFED PE) 10 MG TABS tablet Take 10 mg by mouth every 4 (four) hours as needed (for nasal congestion).    [provider]  ranitidine  (ZANTAC) 150 MG capsule Take 150 mg by mouth 2 (two) times daily as needed for heartburn.     [provider]  traZODone (DESYREL) 100 MG tablet Take 1 tablet (100 mg total) by mouth at bedtime. 04/19/17   Hildred Priest, MD    Allergies Aspirin, Flurbiprofen, and Nsaids  Family History  Problem Relation Age of Onset  . Heart disease Mother   . Hyperlipidemia Mother   . Hypertension Mother   . Kidney disease Mother   . Arthritis Mother   . Stroke Father   . Diabetes Father     Social History Social History   Tobacco Use  . Smoking status: Current Every Day Smoker    Packs/day: 0.25    Types: Cigarettes  . Smokeless tobacco: Never Used  . Tobacco comment: Nicotine patch was ordered  Vaping Use  . Vaping Use: Never used  Substance Use Topics  . Alcohol use: No  . Drug use: Not Currently    Types: Cocaine, Amphetamines, Marijuana    Comment: last 1 month ago     Review of Systems  Constitutional: No fever/chills ENT: No upper respiratory complaints. Cardiovascular: No chest pain. Respiratory: No SOB. Gastrointestinal: No abdominal pain.  No nausea, no vomiting.  Musculoskeletal: Negative for musculoskeletal pain. Skin: Negative for rash, abrasions, lacerations, ecchymosis. Neurological: Negative for numbness or tingling. Positive for headache.   ____________________________________________   PHYSICAL EXAM:  VITAL SIGNS: ED Triage Vitals  Enc Vitals Group     BP 03/11/20 1243 137/81     Pulse Rate 03/11/20 1243 (!) 107     Resp 03/11/20 1243 20     Temp 03/11/20 1243 98.7 F (37.1 C)     Temp Source 03/11/20 1243 Oral     SpO2 03/11/20 1243 98 %     Weight 03/11/20 1244 133 lb (60.3 kg)     Height 03/11/20 1244 5\' 7"  (1.702 m)     Head Circumference --      Peak Flow --      Pain Score 03/11/20 1244 9     Pain Loc --      Pain Edu? --      Excl. in [place]? --      Constitutional: Alert and oriented. Well appearing and in no acute  distress. Eyes: Conjunctivae are normal. PERRL. EOMI. Head: Left-sided cheek swelling. ENT:      Ears:      Nose: No congestion/rhinnorhea.      Mouth/Throat: Mucous membranes are moist.  Neck: No stridor.  Cardiovascular: Normal rate, regular rhythm.  Good peripheral circulation. Respiratory: Normal respiratory effort without tachypnea or retractions. Lungs CTAB. Good air entry to the bases with no decreased or absent breath sounds. Gastrointestinal: Bowel sounds 4 quadrants. Soft and nontender to palpation. No guarding or rigidity. No palpable masses. No distention. Musculoskeletal: Full range of motion to all extremities. No gross deformities appreciated. Neurologic:  Normal speech and language. No gross focal neurologic deficits are appreciated.  Skin:  Skin is warm, dry and intact. No rash noted. Psychiatric: Mood and affect are normal. Speech and behavior are  normal. Patient exhibits appropriate insight and judgement.   ____________________________________________   LABS (all labs ordered are listed, but only abnormal results are displayed)  Labs Reviewed  CBC WITH DIFFERENTIAL/PLATELET - Abnormal; Notable for the following components:      Result Value   WBC 13.8 (*)    Neutro Abs 8.4 (*)    Lymphs Abs 4.1 (*)    All other components within normal limits  BASIC METABOLIC PANEL   ____________________________________________  EKG   ____________________________________________  RADIOLOGY Lexine Baton, personally viewed and evaluated these images (plain radiographs) as part of my medical decision making, as well as reviewing the written report by the radiologist.  CT Maxillofacial W Contrast  Result Date: 03/11/2020 CLINICAL DATA:  Mass, lump or swelling, max face. Additional provided: Patient reports swelling intermittently to each side of face recently, now involving the left upper face with periorbital swelling and upper lip swelling EXAM: CT MAXILLOFACIAL WITH  CONTRAST TECHNIQUE: Multidetector CT imaging of the maxillofacial structures was performed with intravenous contrast. Multiplanar CT image reconstructions were also generated. CONTRAST:  13mL OMNIPAQUE IOHEXOL 300 MG/ML  SOLN COMPARISON:  Head CT 12/26/2014 FINDINGS: Mildly motion degraded exam. Osseous: Age-indeterminate although presumed chronic fracture deformity of the right mandibular coronoid process (series 6, image 17). Partially imaged ACDF hardware within the visualized upper cervical spine. There is apparent lucency surrounding the vertebral screws at the C3 level. Orbits: There is left periorbital soft tissue swelling which is nonspecific, but likely reflects left periorbital cellulitis. There is no CT evidence of postseptal extension of infection on the current exam. The globes are normal in size and contour. The extraocular muscles and optic nerve sheath complexes are symmetric and unremarkable. Sinuses: Minimal mucosal thickening within the inferior maxillary sinuses. Soft tissues: Left periorbital soft tissue swelling. Additionally, there is swelling and inflammatory stranding of the upper lip. Findings likely reflect cellulitis changes. No definite soft tissue abscess is identified. Limited intracranial: No acute intracranial abnormality identified. Other: Poor dentition with multiple absent, carious and fractured teeth. Additionally, there are multiple teeth which demonstrate periapical lucency. Most notably in the region of concern, there is prominent periapical lucency surrounding the left maxillary central and lateral incisors (series 7, image 38). Periapical lucency also surrounds the right maxillary lateral incisor and canine teeth. IMPRESSION: Swelling and inflammatory stranding affecting the left periorbital and upper lip soft tissues. Findings likely reflect periorbital and upper lip cellulitis. An odontogenic source is questioned as there is poor dentition with periapical lucency  surrounding multiple teeth. In the region of concern, there is prominent periapical lucency surrounding the left maxillary central and lateral incisors. Periapical lucency also surrounds the right maxillary lateral incisor and canine tooth. No definite soft tissue abscess is identified. No CT evidence of postseptal orbital extension of infection on the current exam. Mild maxillary sinus mucosal thickening bilaterally. Age-indeterminate, although presumed chronic, fracture deformity of the right mandibular coronoid process. Partially imaged ACDF hardware within the upper cervical spine. There is lucency surrounding the screws within the C3 vertebral body which may reflect hardware loosening. Electronically Signed   By: Jackey Loge DO   On: 03/11/2020 15:40    ____________________________________________    PROCEDURES  Procedure(s) performed:    Procedures    Medications  oxyCODONE-acetaminophen (PERCOCET/ROXICET) 5-325 MG per tablet 1 tablet (1 tablet Oral Given 03/11/20 1355)  cefTRIAXone (ROCEPHIN) 1 g in sodium chloride 0.9 % 100 mL IVPB (0 g Intravenous Stopped 03/11/20 1448)  sodium chloride 0.9 % bolus 500  mL (0 mLs Intravenous Stopped 03/11/20 1628)  iohexol (OMNIPAQUE) 300 MG/ML solution 75 mL (75 mLs Intravenous Contrast Given 03/11/20 1449)  clindamycin (CLEOCIN) IVPB 600 mg (0 mg Intravenous Stopped 03/11/20 1714)     ____________________________________________   INITIAL IMPRESSION / ASSESSMENT AND PLAN / ED COURSE  Pertinent labs & imaging results that were available during my care of the patient were reviewed by me and considered in my medical decision making (see chart for details).  Review of the [place] CSRS was performed in accordance of the [facility] prior to dispensing any controlled drugs.     Patient's diagnosis is consistent with facial cellulitis.  Vital signs and exam are reassuring.  CT scan consistent with periorbital and left upper lip cellulitis without evidence of  post septal extension.  Patient given IV ceftriaxone and IV clindamycin for cellulitis.  Patient will be discharged home with prescriptions for Augmentin and clindamycin. Patient is to follow up with dentist and primary care as directed. Patient is given ED precautions to return to the ED for any worsening or new symptoms.  Regene Mccarthy was evaluated in Emergency Department on 03/11/2020 for the symptoms described in the history of present illness. She was evaluated in the context of the global COVID-19 pandemic, which necessitated consideration that the patient might be at risk for infection with the SARS-CoV-2 virus that causes COVID-19. Institutional protocols and algorithms that pertain to the evaluation of patients at risk for COVID-19 are in a state of rapid change based on information released by regulatory bodies including the CDC and federal and state organizations. These policies and algorithms were followed during the patient's care in the ED.   ____________________________________________  FINAL CLINICAL IMPRESSION(S) / ED DIAGNOSES  Final diagnoses:  Preseptal cellulitis  Facial cellulitis      NEW MEDICATIONS STARTED DURING THIS VISIT:  ED Discharge Orders         Ordered    amoxicillin-clavulanate (AUGMENTIN) 875-125 MG tablet  2 times daily     Discontinue  Reprint     03/11/20 1642    clindamycin (CLEOCIN) 300 MG capsule  3 times daily     Discontinue  Reprint     03/11/20 1642              This chart was dictated using voice recognition software/Dragon. Despite best efforts to proofread, errors can occur which can change the meaning. Any change was purely unintentional.    Enid Derry, PA-C 03/11/20 1805    Minna Antis, MD 03/12/20 1535

## 2020-03-11 NOTE — ED Notes (Signed)
See triage note  Presents with left sided facial pain and swelling  States she had similar issue on the right  Took some of her dogs meds  That pain and swelling went away  States had some discomfort yesterday to left side of face   But woke up with am with more pain and swelling

## 2020-03-11 NOTE — ED Triage Notes (Signed)
Pt states that she has been having swelling intermittently to each side of her face recently, but states now the left upper part of her face is swollen and pt also has periorbital swelling and upper lip swelling, pt states that she has bad teeth from grinding them and recently took some of her dog's antibiotics and that helped

## 2020-03-14 ENCOUNTER — Other Ambulatory Visit: Payer: Self-pay | Admitting: Nurse Practitioner

## 2020-03-14 ENCOUNTER — Other Ambulatory Visit: Payer: Self-pay | Admitting: Neurosurgery

## 2020-03-14 DIAGNOSIS — M5412 Radiculopathy, cervical region: Secondary | ICD-10-CM

## 2020-03-14 DIAGNOSIS — Z981 Arthrodesis status: Secondary | ICD-10-CM

## 2020-03-15 ENCOUNTER — Ambulatory Visit: Payer: Medicare Other

## 2020-03-18 ENCOUNTER — Ambulatory Visit: Admission: RE | Admit: 2020-03-18 | Payer: Medicare Other | Source: Ambulatory Visit

## 2020-03-31 ENCOUNTER — Other Ambulatory Visit: Payer: Self-pay

## 2020-03-31 ENCOUNTER — Emergency Department
Admission: EM | Admit: 2020-03-31 | Discharge: 2020-03-31 | Disposition: A | Discharge status: Home / Self Care | Payer: Medicare Other | Attending: Student in an Organized Health Care Education/Training Program | Admitting: Student in an Organized Health Care Education/Training Program

## 2020-03-31 DIAGNOSIS — F17210 Nicotine dependence, cigarettes, uncomplicated: Secondary | ICD-10-CM | POA: Diagnosis not present

## 2020-03-31 DIAGNOSIS — E039 Hypothyroidism, unspecified: Secondary | ICD-10-CM | POA: Diagnosis not present

## 2020-03-31 DIAGNOSIS — K047 Periapical abscess without sinus: Secondary | ICD-10-CM | POA: Diagnosis not present

## 2020-03-31 DIAGNOSIS — Z886 Allergy status to analgesic agent status: Secondary | ICD-10-CM | POA: Diagnosis not present

## 2020-03-31 DIAGNOSIS — K0889 Other specified disorders of teeth and supporting structures: Secondary | ICD-10-CM | POA: Diagnosis present

## 2020-03-31 MED ORDER — OXYCODONE-ACETAMINOPHEN 5-325 MG PO TABS
1.0000 | ORAL_TABLET | Freq: Once | ORAL | Status: AC
  Administered 2020-03-31: 1 via ORAL
  Filled 2020-03-31: qty 1

## 2020-03-31 MED ORDER — OXYCODONE-ACETAMINOPHEN 5-325 MG PO TABS: 1.0000 | ORAL_TABLET | Freq: Four times a day (QID) | ORAL | 0 refills | Status: DC | PRN

## 2020-03-31 MED ORDER — AMOXICILLIN 875 MG PO TABS: 875.0000 mg | ORAL_TABLET | Freq: Two times a day (BID) | ORAL | 0 refills | Status: DC

## 2020-03-31 NOTE — ED Provider Notes (Signed)
[facility] Emergency Department Provider Note   ____________________________________________   First MD Initiated Contact with Patient 03/31/20 1352     (approximate)  I have reviewed the triage vital signs and the nursing notes.   HISTORY  Chief Complaint Facial Swelling   HPI Mercedes Forbes is a 49 y.o. female presents to the ED with complaint of dental pain.  Patient states that she was seen in the emergency department approximately 1 month ago and was treated for cellulitis of her face.  She states in the last week she has felt a soreness to her gum.  She denies any fever or chills.  She reports that she did take all the antibiotics from last time.  Patient does continue to smoke daily.      Past Medical History:  Diagnosis Date  . Allergy   . Anxiety   . Arthritis   . Bipolar 1 disorder (HCC)   . Depression   . GERD (gastroesophageal reflux disease)   . Headache    migraines  . Personality disorder (HCC)   . Polysubstance abuse (HCC)   . Schizophrenia [facility])     Patient Active Problem List   Diagnosis Date Noted  . Cervical radiculopathy 08/06/2018  . Chronic upper extremity pain (Secondary Area of Pain) (Bilateral) (R>L) 07/28/2018  . DDD (degenerative disc disease), cervical 07/28/2018  . Chronic neck pain (Primary Area of Pain) (Bilateral) (R>L) 07/15/2018  . Chronic knee pain [facility] Area of Pain) (Right) 07/15/2018  . Chronic low back pain ([name]) (Bilateral) (R>L) w/ sciatica (Bilateral) 07/15/2018  . Chronic lower extremity pain (Bilateral) (R>L) 07/15/2018  . Chronic hand pain (Bilateral) (L>R) 07/15/2018  . Chronic pain syndrome 07/15/2018  . Long term current use of opiate analgesic 07/15/2018  . Pharmacologic therapy 07/15/2018  . Disorder of skeletal system 07/15/2018  . Problems influencing health status 07/15/2018  . Lower extremity weakness 07/15/2018  . Arm numbness 04/21/2018  . Amphetamine abuse  (HCC) 04/15/2017  . Hypothyroidism 03/08/2017  . Opiate abuse, episodic (HCC) 07/22/2016  . PTSD (post-traumatic stress disorder) 04/04/2016  . Tobacco use disorder 04/03/2016  . Bipolar 2 disorder, major depressive episode (HCC) 04/02/2016  . Cocaine use disorder, moderate, dependence (HCC) 04/02/2016  . Cannabis use disorder, moderate, dependence (HCC) 04/02/2016    Past Surgical History:  Procedure Laterality Date  . ANTERIOR CERVICAL DECOMPRESSION/DISCECTOMY FUSION 4 LEVELS N/A 08/06/2018   Procedure: ANTERIOR CERVICAL DECOMPRESSION/DISCECTOMY FUSION 4 LEVELS-c3-7;  Surgeon: Venetia Night, MD;  Location: [facility] ORS;  Service: Neurosurgery;  Laterality: N/A;  . KNEE ARTHROSCOPY WITH PATELLA RECONSTRUCTION     left patella removed  . wrists Bilateral     Prior to Admission medications   Medication Sig Start Date End Date Taking? Authorizing Provider  acetaminophen (TYLENOL) 650 MG CR tablet Take 1,300 mg by mouth every 8 (eight) hours as needed for pain.    [provider]  amoxicillin (AMOXIL) 875 MG tablet Take 1 tablet (875 mg total) by mouth 2 (two) times daily. 03/31/20   Tommi Rumps, PA-C  busPIRone (BUSPAR) 30 MG tablet Take 30 mg by mouth 3 (three) times daily.     [provider]  DULoxetine (CYMBALTA) 60 MG capsule Take 120 mg by mouth daily.     [provider]  gabapentin (NEURONTIN) 300 MG capsule Take 300 mg by mouth 3 (three) times daily.    [provider]  hydrOXYzine (ATARAX/VISTARIL) 50 MG tablet Take 50 mg by  mouth 3 (three) times daily.     [provider]  oxyCODONE-acetaminophen (PERCOCET) 5-325 MG tablet Take 1 tablet by mouth every 6 (six) hours as needed for severe pain. 03/31/20   Tommi Rumps, PA-C  phenylephrine (SUDAFED PE) 10 MG TABS tablet Take 10 mg by mouth every 4 (four) hours as needed (for nasal congestion).    [provider]  ranitidine (ZANTAC) 150 MG capsule Take 150 mg by mouth 2  (two) times daily as needed for heartburn.     [provider]  traZODone (DESYREL) 100 MG tablet Take 1 tablet (100 mg total) by mouth at bedtime. 04/19/17   [name], MD    Allergies Aspirin, Flurbiprofen, and Nsaids  Family History  Problem Relation Age of Onset  . Heart disease Mother   . Hyperlipidemia Mother   . Hypertension Mother   . Kidney disease Mother   . Arthritis Mother   . Stroke Father   . Diabetes Father     Social History Social History   Tobacco Use  . Smoking status: Current Every Day Smoker    Packs/day: 0.25    Types: Cigarettes  . Smokeless tobacco: Never Used  . Tobacco comment: Nicotine patch was ordered  Vaping Use  . Vaping Use: Never used  Substance Use Topics  . Alcohol use: No  . Drug use: Not Currently    Types: Cocaine, Amphetamines, Marijuana    Comment: last 1 month ago    Review of Systems Constitutional: No fever/chills Eyes: No visual changes. ENT: No sore throat.  Positive painful gum. Cardiovascular: Denies chest pain. Respiratory: Denies shortness of breath. Gastrointestinal: No abdominal pain.  No nausea, no vomiting.  Musculoskeletal: Negative for muscle skeletal pain. Skin: Negative for rash. Neurological: Negative for headaches, focal weakness or numbness. ____________________________________________   PHYSICAL EXAM:  VITAL SIGNS: ED Triage Vitals [03/31/20 1340]  Enc Vitals Group     BP (!) 136/100     Pulse Rate 82     Resp 18     Temp 98.8 F (37.1 C)     Temp Source Oral     SpO2 98 %     Weight 133 lb (60.3 kg)     Height 5\' 8"  (1.727 m)     Head Circumference      Peak Flow      Pain Score 9     Pain Loc      Pain Edu?      Excl. in [place]?     Constitutional: Alert and oriented. Well appearing and in no acute distress. Eyes: Conjunctivae are normal. PERRL. EOMI. Head: Atraumatic. Nose: No congestion/rhinnorhea. Mouth/Throat: Mucous membranes are moist.  Oropharynx  non-erythematous.  All upper teeth are in very poor repair with most of them broken off at the gum.  There is a fistula on the gum left upper central incisor.  No active drainage at present.  Area is tender to light touch. Neck: No stridor.   Cardiovascular: Normal rate, regular rhythm. Grossly normal heart sounds.  Good peripheral circulation. Respiratory: Normal respiratory effort.  No retractions. Lungs CTAB. Musculoskeletal: Moves upper and lower extremities without any difficulty.  Normal gait was noted. Neurologic:  Normal speech and language. No gross focal neurologic deficits are appreciated.  Skin:  Skin is warm, dry and intact.  Psychiatric: Mood and affect are normal. Speech and behavior are normal.  ____________________________________________   LABS (all labs ordered are listed, but only abnormal results are displayed)  Labs Reviewed - No data to display  PROCEDURES  Procedure(s) performed (including Critical Care):  Procedures   ____________________________________________   INITIAL IMPRESSION / ASSESSMENT AND PLAN / ED COURSE  As part of my medical decision making, I reviewed the following data within the electronic MEDICAL RECORD NUMBER Notes from prior ED visits and [place] Controlled Substance Database  50 year old female presents to the ED with complaint of left upper gum pain along with dental pain.  Patient was seen 1 month ago for cellulitis of her face which cleared.  She denies any fever or chills.  There is an area on the left upper gum over the central incisor which appears to be a small fistula.  No drainage is noted at this time however area is extremely tender to light touch.  Patient is encouraged to see a Energy manager.  Today she was started on amoxicillin 875 twice daily for 10 days and 10 tablets of Percocet 5/325. ____________________________________________   FINAL CLINICAL IMPRESSION(S) / ED DIAGNOSES  Final diagnoses:  Dental abscess     ED  Discharge Orders         Ordered    amoxicillin (AMOXIL) 875 MG tablet  2 times daily     Discontinue  Reprint     03/31/20 1431    oxyCODONE-acetaminophen (PERCOCET) 5-325 MG tablet  Every 6 hours PRN     Discontinue  Reprint     03/31/20 1431           Note:  This document was prepared using Dragon voice recognition software and may include unintentional dictation errors.    Tommi Rumps, PA-C 03/31/20 1443    Willy Eddy, MD 03/31/20 [phone]

## 2020-03-31 NOTE — Discharge Instructions (Addendum)
Begin taking antibiotics until completely finished.  A small number of pain pills was sent to your pharmacy.  If any additional pain medication is needed you will need to contact your primary care provider or your dentist.  If you do not have a dentist a list is printed on your discharge papers.  Discontinue smoking as this increases your dental pain.  OPTIONS FOR DENTAL FOLLOW UP CARE  [place] Department of Health and Human Services - Local Safety Net Dental Clinics [email or web address]   [facility] [phone])  [facility] [phone])  [facility] [phone])  [facility] [phone])  [facility] [phone]) This clinic caters to the indigent population and is on a lottery system. Location: [employer] [facility], [employer], [address] [place] Clinic Hours: Wednesdays from 6pm - 9pm, patients seen by a lottery system. For dates, call or go to [email or web address] Services: Cleanings, fillings and simple extractions. Payment Options: DENTAL WORK IS FREE OF CHARGE. Bring proof of income or support. Best way to get seen: Arrive at 5:15 pm - this is a lottery, NOT first come/first serve, so arriving earlier will not increase your chances of being seen.     [facility] [phone] Select option 1 for emergencies   Location: [facility], [place], [address] [place] Clinic Hours: No walk-ins accepted - call the day before to schedule an appointment. Check in times are 9:30 am and 1:30 pm. Services: Simple extractions, temporary fillings, pulpectomy/pulp debridement, uncomplicated abscess drainage. Payment Options: PAYMENT IS DUE AT THE TIME OF SERVICE.  Fee is usually $100-200, additional surgical procedures (e.g. abscess drainage) may be extra. Cash, checks,  Visa/MasterCard accepted.  Can file Medicaid if patient is covered for dental - patient should call case worker to check. No discount for [facility] patients. Best way to get seen: MUST call the day before and get onto the schedule. Can usually be seen the next 1-2 days. No walk-ins accepted.     [facility] [phone]   Location: [facility], [address], [place] Clinic Hours: M, W, Th, F 8am or 1:30pm, Tues 9a or 1:30 - first come/first served. Services: Simple extractions, temporary fillings, uncomplicated abscess drainage.  You do not need to be an [facility] resident. Payment Options: PAYMENT IS DUE AT THE TIME OF SERVICE. Dental insurance, otherwise sliding scale - bring proof of income or support. Depending on income and treatment needed, cost is usually $50-200. Best way to get seen: Arrive early as it is first come/first served.     [facility] [phone]   Location: [address] Clinic Hours: Mon-Thu 8a-5p Services: Most basic dental services including extractions and fillings. Payment Options: PAYMENT IS DUE AT THE TIME OF SERVICE. Sliding scale, up to 50% off - bring proof if income or support. Medicaid with dental option accepted. Best way to get seen: Call to schedule an appointment, can usually be seen within 2 weeks OR they will try to see walk-ins - show up at 8a or 2p (you may have to wait).     [facility] [phone] [place] RESIDENTS ONLY   Location: [facility], [address], [place], [place] [address] Clinic Hours: By appointment only. Monday - Thursday 8am-5pm, Friday 8am-12pm Services: Cleanings, fillings, extractions. Payment Options: PAYMENT IS DUE AT THE TIME OF SERVICE. Cash, Visa or MasterCard.  Sliding scale - $30 minimum per service. Best way to get seen: Come in to office, complete packet and  make an appointment - need proof of income or support monies for each household member and proof of [facility] residence. Usually takes about a month to get in.     [facility] [phone]   Location: [address], [place] Clinic Hours: Walk-in Urgent Care Dental Services are offered Monday-Friday mornings only. The numbers of emergencies accepted daily is limited to the number of providers available. Maximum 15 - Mondays, Wednesdays & Thursdays Maximum 10 - Tuesdays & Fridays Services: You do not need to be a [facility] resident to be seen for a dental emergency. Emergencies are defined as pain, swelling, abnormal bleeding, or dental trauma. Walkins will receive x-rays if needed. NOTE: Dental cleaning is not an emergency. Payment Options: PAYMENT IS DUE AT THE TIME OF SERVICE. Minimum co-pay is $40.00 for uninsured patients. Minimum co-pay is $3.00 for Medicaid with dental coverage. Dental Insurance is accepted and must be presented at time of visit. Medicare does not cover dental. Forms of payment: Cash, credit card, checks. Best way to get seen: If not previously registered with the clinic, walk-in dental registration begins at 7:15 am and is on a first come/first serve basis. If previously registered with the clinic, call to make an appointment.     [facility] [place] ONLY   Location: [address] [place], [place] Clinic Hours: Mon-Thu 10a-2p Services: Extractions only! Payment Options: FREE (donations accepted) - bring proof of income or support Best way to get seen: Call and schedule an appointment OR come at 8am on the 1st Monday of every month (except for holidays) when it is first come/first served.     [facility] [phone]   Location: [place], [place] Clinic Hours: Friday mornings Services, Payment Options, Best way to get seen: Call for info

## 2020-03-31 NOTE — ED Triage Notes (Signed)
Reports X 1 week, "egg" and swelling to left cheek area. Pt was treated for cellulitis in same area X 1 month ago wants to have it evaluated for infection.

## 2020-04-04 ENCOUNTER — Emergency Department
Admission: EM | Admit: 2020-04-04 | Discharge: 2020-04-04 | Disposition: A | Discharge status: Home / Self Care | Payer: Medicare Other | Attending: Emergency Medicine | Admitting: Emergency Medicine

## 2020-04-04 ENCOUNTER — Other Ambulatory Visit: Payer: Self-pay

## 2020-04-04 DIAGNOSIS — F17210 Nicotine dependence, cigarettes, uncomplicated: Secondary | ICD-10-CM | POA: Diagnosis not present

## 2020-04-04 DIAGNOSIS — K0510 Chronic gingivitis, plaque induced: Secondary | ICD-10-CM | POA: Insufficient documentation

## 2020-04-04 DIAGNOSIS — E039 Hypothyroidism, unspecified: Secondary | ICD-10-CM | POA: Diagnosis not present

## 2020-04-04 DIAGNOSIS — K0889 Other specified disorders of teeth and supporting structures: Secondary | ICD-10-CM | POA: Insufficient documentation

## 2020-04-04 MED ORDER — ACETAMINOPHEN 500 MG PO TABS
1000.0000 mg | ORAL_TABLET | Freq: Once | ORAL | Status: DC
  Filled 2020-04-04: qty 2

## 2020-04-04 MED ORDER — AMOXICILLIN-POT CLAVULANATE 875-125 MG PO TABS: 1.0000 | ORAL_TABLET | Freq: Two times a day (BID) | ORAL | 0 refills | Status: AC

## 2020-04-04 MED ORDER — LIDOCAINE VISCOUS HCL 2 % MT SOLN
15.0000 mL | OROMUCOSAL | 0 refills | Status: DC | PRN
Start: 2020-04-04 — End: 2020-05-04

## 2020-04-04 MED ORDER — LIDOCAINE VISCOUS HCL 2 % MT SOLN
15.0000 mL | Freq: Once | OROMUCOSAL | Status: AC
  Administered 2020-04-04: 15 mL via OROMUCOSAL
  Filled 2020-04-04: qty 15

## 2020-04-04 MED ORDER — AMOXICILLIN-POT CLAVULANATE 875-125 MG PO TABS
1.0000 | ORAL_TABLET | Freq: Once | ORAL | Status: AC
  Administered 2020-04-04: 1 via ORAL
  Filled 2020-04-04: qty 1

## 2020-04-04 NOTE — ED Triage Notes (Signed)
Pt arrives to ED via POV from home with c/o ongoing bilateral upper jaw dental pain. Pt reports being seen recently for same; states "I just got insurance to take them taken care of, but haven't been able to schedule an appt". Pt reports (+) nausea, but denies V/D. Pt is A&O, in NAD; RR even, regular, and unlabored.

## 2020-04-04 NOTE — ED Provider Notes (Signed)
[facility] Emergency Department Provider Note  ____________________________________________  Time seen: Approximately 11:42 PM  I have reviewed the triage vital signs and the nursing notes.   HISTORY  Chief Complaint Dental Pain   HPI Mercedes Forbes is a 49 y.o. female with a history of polysubstance abuse presents for evaluation of dental pain.  This is a chronic problem for the patient.   She reports that she recently started to have insurance coverage but has not had a chance to see a dentist yet.  She has been on amoxicillin Augmentin in the past.  She reports that the pain is getting progressively worse since she finished her last course of amoxicillin.  The pain is diffusely, constant, and severe. She has several cavities.  No trismus, no difficulty swallowing or breathing, no facial swelling, no neck swelling.  Past Medical History:  Diagnosis Date  . Allergy   . Anxiety   . Arthritis   . Bipolar 1 disorder (HCC)   . Depression   . GERD (gastroesophageal reflux disease)   . Headache    migraines  . Personality disorder (HCC)   . Polysubstance abuse (HCC)   . Schizophrenia [facility])     Patient Active Problem List   Diagnosis Date Noted  . Cervical radiculopathy 08/06/2018  . Chronic upper extremity pain (Secondary Area of Pain) (Bilateral) (R>L) 07/28/2018  . DDD (degenerative disc disease), cervical 07/28/2018  . Chronic neck pain (Primary Area of Pain) (Bilateral) (R>L) 07/15/2018  . Chronic knee pain [facility] Area of Pain) (Right) 07/15/2018  . Chronic low back pain ([name]) (Bilateral) (R>L) w/ sciatica (Bilateral) 07/15/2018  . Chronic lower extremity pain (Bilateral) (R>L) 07/15/2018  . Chronic hand pain (Bilateral) (L>R) 07/15/2018  . Chronic pain syndrome 07/15/2018  . Long term current use of opiate analgesic 07/15/2018  . Pharmacologic therapy 07/15/2018  . Disorder of skeletal system 07/15/2018  . Problems  influencing health status 07/15/2018  . Lower extremity weakness 07/15/2018  . Arm numbness 04/21/2018  . 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 Hypothyroidism 03/08/2017  . Opiate abuse, episodic (HCC) 07/22/2016  . PTSD (post-traumatic stress disorder) 04/04/2016  . Tobacco use disorder 04/03/2016  . Bipolar 2 disorder, major depressive episode (HCC) 04/02/2016  . Cocaine use disorder, moderate, dependence (HCC) 04/02/2016  . Cannabis use disorder, moderate, dependence (HCC) 04/02/2016    Past Surgical History:  Procedure Laterality Date  . ANTERIOR CERVICAL DECOMPRESSION/DISCECTOMY FUSION 4 LEVELS N/A 08/06/2018   Procedure: ANTERIOR CERVICAL DECOMPRESSION/DISCECTOMY FUSION 4 LEVELS-c3-7;  Surgeon: Venetia Night, MD;  Location: [facility] ORS;  Service: Neurosurgery;  Laterality: N/A;  . KNEE ARTHROSCOPY WITH PATELLA RECONSTRUCTION     left patella removed  . wrists Bilateral     Prior to Admission medications   Medication Sig Start Date End Date Taking? Authorizing Provider  acetaminophen (TYLENOL) 650 MG CR tablet Take 1,300 mg by mouth every 8 (eight) hours as needed for pain.    [provider]  amoxicillin (AMOXIL) 875 MG tablet Take 1 tablet (875 mg total) by mouth 2 (two) times daily. 03/31/20   Tommi Rumps, PA-C  amoxicillin-clavulanate (AUGMENTIN) 875-125 MG tablet Take 1 tablet by mouth 2 (two) times daily for 14 days. 04/04/20 04/18/20  Nita Sickle, MD  busPIRone (BUSPAR) 30 MG tablet Take 30 mg by mouth 3 (three) times daily.     [provider]  DULoxetine (CYMBALTA) 60 MG capsule Take 120 mg by mouth daily.  [provider]  gabapentin (NEURONTIN) 300 MG capsule Take 300 mg by mouth 3 (three) times daily.    [provider]  hydrOXYzine (ATARAX/VISTARIL) 50 MG tablet Take 50 mg by mouth 3 (three) times daily.     [provider]  lidocaine (XYLOCAINE) 2 % solution Use as directed 15 mLs in the mouth or throat as  needed for mouth pain. 04/04/20   Nita Sickle, MD  oxyCODONE-acetaminophen (PERCOCET) 5-325 MG tablet Take 1 tablet by mouth every 6 (six) hours as needed for severe pain. 03/31/20   Tommi Rumps, PA-C  phenylephrine (SUDAFED PE) 10 MG TABS tablet Take 10 mg by mouth every 4 (four) hours as needed (for nasal congestion).    [provider]  ranitidine (ZANTAC) 150 MG capsule Take 150 mg by mouth 2 (two) times daily as needed for heartburn.     [provider]  traZODone (DESYREL) 100 MG tablet Take 1 tablet (100 mg total) by mouth at bedtime. 04/19/17   [name], MD    Allergies Aspirin, Flurbiprofen, and Nsaids  Family History  Problem Relation Age of Onset  . Heart disease Mother   . Hyperlipidemia Mother   . Hypertension Mother   . Kidney disease Mother   . Arthritis Mother   . Stroke Father   . Diabetes Father     Social History Social History   Tobacco Use  . Smoking status: Current Every Day Smoker    Packs/day: 0.25    Types: Cigarettes  . Smokeless tobacco: Never Used  . Tobacco comment: Nicotine patch was ordered  Vaping Use  . Vaping Use: Never used  Substance Use Topics  . Alcohol use: No  . Drug use: Not Currently    Types: Cocaine, Amphetamines, Marijuana    Comment: last 1 month ago    Review of Systems  Constitutional: Negative for fever. Eyes: Negative for visual changes. ENT: Negative for sore throat. + dental pain Neck: No neck pain  Cardiovascular: Negative for chest pain. Respiratory: Negative for shortness of breath. Gastrointestinal: Negative for abdominal pain, vomiting or diarrhea. Genitourinary: Negative for dysuria. Musculoskeletal: Negative for back pain. Skin: Negative for rash. Neurological: Negative for headaches, weakness or numbness. Psych: No SI or HI  ____________________________________________   PHYSICAL EXAM:  VITAL SIGNS: ED Triage Vitals  Enc Vitals Group     BP 04/04/20  2016 (!) 128/96     Pulse Rate 04/04/20 2016 86     Resp 04/04/20 2016 18     Temp 04/04/20 2016 99.3 F (37.4 C)     Temp Source 04/04/20 2016 Oral     SpO2 04/04/20 2016 98 %     Weight 04/04/20 2016 133 lb (60.3 kg)     Height 04/04/20 2016 5\' 7"  (1.702 m)     Head Circumference --      Peak Flow --      Pain Score 04/04/20 2025 10     Pain Loc --      Pain Edu? --      Excl. in GC? --     Constitutional: Alert and oriented. Well appearing and in no apparent distress. HEENT:      Head: Normocephalic and atraumatic.         Eyes: Conjunctivae are normal. Sclera is non-icteric.       Mouth/Throat: Mucous membranes are moist.  Extremely poor dentition with several broken teeth, several cavities, the upper gingiva is swollen and erythematous with no obvious  abscess.  The floor of the mouth is soft with no induration, she has no trismus.      Neck: Supple with no signs of meningismus. Cardiovascular: Regular rate and rhythm.  Respiratory: Normal respiratory effort.  Musculoskeletal: No edema, cyanosis, or erythema of extremities. Neurologic: Normal speech and language. Face is symmetric. Moving all extremities. No gross focal neurologic deficits are appreciated. Skin: Skin is warm, dry and intact. No rash noted. Psychiatric: Mood and affect are normal. Speech and behavior are normal.  ____________________________________________   LABS (all labs ordered are listed, but only abnormal results are displayed)  Labs Reviewed - No data to display ____________________________________________  EKG  none  ____________________________________________  RADIOLOGY  none  ____________________________________________   PROCEDURES  Procedure(s) performed: None Procedures Critical Care performed:  None ____________________________________________   INITIAL IMPRESSION / ASSESSMENT AND PLAN / ED COURSE   49 y.o. female with a history of polysubstance abuse presents for  evaluation of dental pain.  Patient with chronic poor dentition and gingivitis.  No cellulitis of the face, no abscess, no Ludwig's angina.  Patient requesting a refill of Augmentin which has helped her pain in the past.  Will provide a prescription for that.  Also given viscous lidocaine for pain.  Patient is upset that nothing else can be done in the emergency room.  I explained to her that unfortunately it is outside of our scope of practice to care for teeth problems and that she needs to see a dentist.  Patient was provided with a list of several dental options in the area.  I discussed my standard return precautions.  Old medical records reviewed.       _____________________________________________ Please note:  Patient was evaluated in Emergency Department today for the symptoms described in the history of present illness. Patient was evaluated in the context of the global COVID-19 pandemic, which necessitated consideration that the patient might be at risk for infection with the SARS-CoV-2 virus that causes COVID-19. Institutional protocols and algorithms that pertain to the evaluation of patients at risk for COVID-19 are in a state of rapid change based on information released by regulatory bodies including the CDC and federal and state organizations. These policies and algorithms were followed during the patient's care in the ED.  Some ED evaluations and interventions may be delayed as a result of limited staffing during the pandemic.   [place] Controlled Substance Database was reviewed by me. ____________________________________________   FINAL CLINICAL IMPRESSION(S) / ED DIAGNOSES   Final diagnoses:  Pain, dental  Gingivitis      NEW MEDICATIONS STARTED DURING THIS VISIT:  ED Discharge Orders         Ordered    amoxicillin-clavulanate (AUGMENTIN) 875-125 MG tablet  2 times daily     Discontinue  Reprint     04/04/20 2335    lidocaine (XYLOCAINE) 2 % solution  As needed      Discontinue  Reprint     04/04/20 2335           Note:  This document was prepared using Dragon voice recognition software and may include unintentional dictation errors.    Don Perking, [place], MD 04/04/20 [phone]

## 2020-04-04 NOTE — Discharge Instructions (Addendum)
OPTIONS FOR DENTAL FOLLOW UP CARE ° °[place] Department of Health and Human Services - Local Safety Net Dental Clinics °http://www.ncdhhs.gov/dph/oralhealth/services/safetynetclinics.htm °  °[facility] ([phone]) ° °[facility] [place] ([phone]) ° °[facility] ([phone]) ° ° [facility] ([phone]) ° °[facility] ([phone]) °This clinic caters to the indigent population and is on a lottery system. °Location: °[facility], [place], [address], [place] °Clinic Hours: °Wednesdays from 6pm - 9pm, patients seen by a lottery system. °For dates, call or go to [email or web address]/[email or web address] °Services: °Cleanings, fillings and simple extractions. °Payment Options: °DENTAL WORK IS FREE OF CHARGE. Bring proof of income or support. °Best way to get seen: °Arrive at 5:15 pm - this is a lottery, NOT first come/first serve, so arriving earlier will not increase your chances of being seen. °  °  °[facility] °[phone] °Select option 1 for emergencies °  °Location: °[facility] [place], [address], [place] °Clinic Hours: °No walk-ins accepted - call the day before to schedule an appointment. °Check in times are 9:30 am and 1:30 pm. °Services: °Simple extractions, temporary fillings, pulpectomy/pulp debridement, uncomplicated abscess drainage. °Payment Options: °PAYMENT IS DUE AT THE TIME OF SERVICE.  Fee is usually $100-200, additional surgical procedures (e.g. abscess drainage) may be extra. °Cash, checks, Visa/MasterCard accepted.  Can file Medicaid if patient is covered for dental - patient should call case worker to check. °No discount for [facility] patients. °Best way to get seen: °MUST call the day before and get onto the schedule. Can usually be seen the next 1-2 days. No walk-ins accepted. °  °  °[facility] °[phone] °   °Location: °[facility], [address], [place] °Clinic Hours: °M, W, Th, F 8am or 1:30pm, Tues 9a or 1:30 - first come/first served. °Services: °Simple extractions, temporary fillings, uncomplicated abscess drainage.  You do not need to be an [place] resident. °Payment Options: °PAYMENT IS DUE AT THE TIME OF SERVICE. °Dental insurance, otherwise sliding scale - bring proof of income or support. °Depending on income and treatment needed, cost is usually $50-200. °Best way to get seen: °Arrive early as it is first come/first served. °  °  °[facility] °[phone] °  °Location: °[address] °Clinic Hours: °Mon-Thu 8a-5p °Services: °Most basic dental services including extractions and fillings. °Payment Options: °PAYMENT IS DUE AT THE TIME OF SERVICE. °Sliding scale, up to 50% off - bring proof if income or support. °Medicaid with dental option accepted. °Best way to get seen: °Call to schedule an appointment, can usually be seen within 2 weeks OR they will try to see walk-ins - show up at 8a or 2p (you may have to wait). °  °  °[facility] °[phone] °[place] RESIDENTS ONLY °  °Location: °[facility], [address], [place], [place] [address] °Clinic Hours: By appointment only. °Monday - Thursday 8am-5pm, Friday 8am-12pm °Services: Cleanings, fillings, extractions. °Payment Options: °PAYMENT IS DUE AT THE TIME OF SERVICE. °Cash, Visa or MasterCard. Sliding scale - $30 minimum per service. °Best way to get seen: °Come in to office, complete packet and make an appointment - need proof of income °or support monies for each household member and proof of [place] residence. °Usually takes about a month to get in. °  °  °[facility] °[phone] °  °Location: °[address]   [place] °Clinic Hours: Walk-in Urgent Care Dental Services are offered Monday-Friday  mornings only. °The numbers of emergencies accepted daily is limited to the number of °providers available. °Maximum 15 - Mondays, Wednesdays & Thursdays °Maximum 10 - Tuesdays & Fridays °Services: °You do not need to be a [place] resident to be seen for a dental emergency. °Emergencies are defined as pain, swelling, abnormal bleeding, or dental trauma. Walkins will receive x-rays if needed. °NOTE: Dental cleaning is not an emergency. °Payment Options: °PAYMENT IS DUE AT THE TIME OF SERVICE. °Minimum co-pay is $40.00 for uninsured patients. °Minimum co-pay is $3.00 for Medicaid with dental coverage. °Dental Insurance is accepted and must be presented at time of visit. °Medicare does not cover dental. °Forms of payment: Cash, credit card, checks. °Best way to get seen: °If not previously registered with the clinic, walk-in dental registration begins at 7:15 am and is on a first come/first serve basis. °If previously registered with the clinic, call to make an appointment. °  °  °[facility] °[phone] °[place] RESIDENTS ONLY °  °Location: °[address], [place], [place] °Clinic Hours: °Mon-Thu 10a-2p °Services: Extractions only! °Payment Options: °FREE (donations accepted) - bring proof of income or support °Best way to get seen: °Call and schedule an appointment OR come at 8am on the 1st Monday of every month (except for holidays) when it is first come/first served. °  °  °[facility] °[phone] °  °Location: °[address],  °Clinic Hours: °Friday mornings °Services, Payment Options, Best way to get seen: °Call for info °

## 2020-05-04 ENCOUNTER — Emergency Department
Admission: EM | Admit: 2020-05-04 | Discharge: 2020-05-04 | Disposition: A | Discharge status: Home / Self Care | Payer: Medicare Other | Attending: Emergency Medicine | Admitting: Emergency Medicine

## 2020-05-04 ENCOUNTER — Other Ambulatory Visit: Payer: Self-pay

## 2020-05-04 DIAGNOSIS — W268XXA Contact with other sharp object(s), not elsewhere classified, initial encounter: Secondary | ICD-10-CM | POA: Insufficient documentation

## 2020-05-04 DIAGNOSIS — S91311A Laceration without foreign body, right foot, initial encounter: Secondary | ICD-10-CM | POA: Diagnosis present

## 2020-05-04 DIAGNOSIS — Z23 Encounter for immunization: Secondary | ICD-10-CM | POA: Diagnosis not present

## 2020-05-04 DIAGNOSIS — F17210 Nicotine dependence, cigarettes, uncomplicated: Secondary | ICD-10-CM | POA: Insufficient documentation

## 2020-05-04 DIAGNOSIS — Y929 Unspecified place or not applicable: Secondary | ICD-10-CM | POA: Diagnosis not present

## 2020-05-04 DIAGNOSIS — Y999 Unspecified external cause status: Secondary | ICD-10-CM | POA: Insufficient documentation

## 2020-05-04 DIAGNOSIS — Y939 Activity, unspecified: Secondary | ICD-10-CM | POA: Diagnosis not present

## 2020-05-04 MED ORDER — ACETAMINOPHEN 500 MG PO TABS
500.0000 mg | ORAL_TABLET | Freq: Four times a day (QID) | ORAL | 0 refills | Status: DC | PRN
Start: 2020-05-04 — End: 2022-05-01

## 2020-05-04 MED ORDER — TETANUS-DIPHTH-ACELL PERTUSSIS 5-2.5-18.5 LF-MCG/0.5 IM SUSP
0.5000 mL | Freq: Once | INTRAMUSCULAR | Status: AC
  Administered 2020-05-04: 0.5 mL via INTRAMUSCULAR
  Filled 2020-05-04: qty 0.5

## 2020-05-04 MED ORDER — CEPHALEXIN 500 MG PO CAPS: 500.0000 mg | ORAL_CAPSULE | Freq: Four times a day (QID) | ORAL | 0 refills | Status: AC

## 2020-05-04 MED ORDER — CEPHALEXIN 500 MG PO CAPS
500.0000 mg | ORAL_CAPSULE | Freq: Once | ORAL | Status: AC
  Administered 2020-05-04: 500 mg via ORAL
  Filled 2020-05-04: qty 1

## 2020-05-04 MED ORDER — TRAMADOL HCL 50 MG PO TABS
50.0000 mg | ORAL_TABLET | Freq: Once | ORAL | Status: AC
  Administered 2020-05-04: 50 mg via ORAL
  Filled 2020-05-04: qty 1

## 2020-05-04 NOTE — ED Notes (Signed)
See triage note  Presents with laceration to bottom of right foot  States she stepped on a tin can lid last pm  Then happened around 12 midnight

## 2020-05-04 NOTE — ED Triage Notes (Signed)
PT arrives via POV from home for a laceration to the bottom of her right foot. Pt reports she stepped on the lid of a can last night around midnight. Pt has gauze taped to bottom of foot with duct tape. Bleeding controlled. Pt ambulatory from lobby with foot propped on walker, NAD observed.

## 2020-05-04 NOTE — ED Provider Notes (Signed)
[facility] Emergency Department Provider Note  ____________________________________________  Time seen: Approximately 7:24 PM  I have reviewed the triage vital signs and the nursing notes.   HISTORY  Chief Complaint Laceration    HPI Mercedes Forbes is a 49 y.o. female that presents to the emergency department for evaluation of right foot laceration.  Patient stepped on a tin can last night.  Patient states that can was very dirty and was in the trash with dog feces.  Injury happened about 20 hours prior.  She is unaware of last tetanus shot.  Patient was unable to get a ride to the emergency department prior to now.  Past Medical History:  Diagnosis Date  . Allergy   . Anxiety   . Arthritis   . Bipolar 1 disorder (HCC)   . Depression   . GERD (gastroesophageal reflux disease)   . Headache    migraines  . Personality disorder (HCC)   . Polysubstance abuse (HCC)   . Schizophrenia [facility])     Patient Active Problem List   Diagnosis Date Noted  . Cervical radiculopathy 08/06/2018  . Chronic upper extremity pain (Secondary Area of Pain) (Bilateral) (R>L) 07/28/2018  . DDD (degenerative disc disease), cervical 07/28/2018  . Chronic neck pain (Primary Area of Pain) (Bilateral) (R>L) 07/15/2018  . Chronic knee pain [facility] Area of Pain) (Right) 07/15/2018  . Chronic low back pain ([name]) (Bilateral) (R>L) w/ sciatica (Bilateral) 07/15/2018  . Chronic lower extremity pain (Bilateral) (R>L) 07/15/2018  . Chronic hand pain (Bilateral) (L>R) 07/15/2018  . Chronic pain syndrome 07/15/2018  . Long term current use of opiate analgesic 07/15/2018  . Pharmacologic therapy 07/15/2018  . Disorder of skeletal system 07/15/2018  . Problems influencing health status 07/15/2018  . Lower extremity weakness 07/15/2018  . Arm numbness 04/21/2018  . 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 Hypothyroidism 03/08/2017  . Opiate abuse, episodic (HCC)  07/22/2016  . PTSD (post-traumatic stress disorder) 04/04/2016  . Tobacco use disorder 04/03/2016  . Bipolar 2 disorder, major depressive episode (HCC) 04/02/2016  . Cocaine use disorder, moderate, dependence (HCC) 04/02/2016  . Cannabis use disorder, moderate, dependence (HCC) 04/02/2016    Past Surgical History:  Procedure Laterality Date  . ANTERIOR CERVICAL DECOMPRESSION/DISCECTOMY FUSION 4 LEVELS N/A 08/06/2018   Procedure: ANTERIOR CERVICAL DECOMPRESSION/DISCECTOMY FUSION 4 LEVELS-c3-7;  Surgeon: Venetia Night, MD;  Location: [facility] ORS;  Service: Neurosurgery;  Laterality: N/A;  . KNEE ARTHROSCOPY WITH PATELLA RECONSTRUCTION     left patella removed  . wrists Bilateral     Prior to Admission medications   Medication Sig Start Date End Date Taking? Authorizing Provider  acetaminophen (TYLENOL) 500 MG tablet Take 1 tablet (500 mg total) by mouth every 6 (six) hours as needed. 05/04/20   Enid Derry, PA-C  busPIRone (BUSPAR) 30 MG tablet Take 30 mg by mouth 3 (three) times daily.     [provider]  cephALEXin (KEFLEX) 500 MG capsule Take 1 capsule (500 mg total) by mouth 4 (four) times daily for 10 days. 05/04/20 05/14/20  Enid Derry, PA-C  DULoxetine (CYMBALTA) 60 MG capsule Take 120 mg by mouth daily.     [provider]  gabapentin (NEURONTIN) 300 MG capsule Take 300 mg by mouth 3 (three) times daily.    [provider]  hydrOXYzine (ATARAX/VISTARIL) 50 MG tablet Take 50 mg by mouth 3 (three) times daily.     [provider]  phenylephrine (SUDAFED PE) 10 MG TABS tablet  Take 10 mg by mouth every 4 (four) hours as needed (for nasal congestion).    [provider]  ranitidine (ZANTAC) 150 MG capsule Take 150 mg by mouth 2 (two) times daily as needed for heartburn.     [provider]  traZODone (DESYREL) 100 MG tablet Take 1 tablet (100 mg total) by mouth at bedtime. 04/19/17   [name], MD     Allergies Aspirin, Flurbiprofen, and Nsaids  Family History  Problem Relation Age of Onset  . Heart disease Mother   . Hyperlipidemia Mother   . Hypertension Mother   . Kidney disease Mother   . Arthritis Mother   . Stroke Father   . Diabetes Father     Social History Social History   Tobacco Use  . Smoking status: Current Every Day Smoker    Packs/day: 0.25    Types: Cigarettes  . Smokeless tobacco: Never Used  . Tobacco comment: Nicotine patch was ordered  Vaping Use  . Vaping Use: Never used  Substance Use Topics  . Alcohol use: No  . Drug use: Not Currently    Types: Cocaine, Amphetamines, Marijuana    Comment: last 1 month ago     Review of Systems  Constitutional: No fever/chills Respiratory: No SOB. Gastrointestinal: No abdominal pain.  No nausea, no vomiting.  Musculoskeletal: Positive for foot pain. Skin: Negative for rash, ecchymosis.  Positive for laceration.   ____________________________________________   PHYSICAL EXAM:  VITAL SIGNS: ED Triage Vitals  Enc Vitals Group     BP 05/04/20 1733 (!) 124/98     Pulse Rate 05/04/20 1733 62     Resp 05/04/20 1733 16     Temp 05/04/20 1733 (!) 97.5 F (36.4 C)     Temp Source 05/04/20 1733 Oral     SpO2 05/04/20 1733 97 %     Weight 05/04/20 1733 133 lb (60.3 kg)     Height 05/04/20 1733 5\' 7"  (1.702 m)     Head Circumference --      Peak Flow --      Pain Score 05/04/20 1743 10     Pain Loc --      Pain Edu? --      Excl. in GC? --      Constitutional: Alert and oriented. Well appearing and in no acute distress. Eyes: Conjunctivae are normal. PERRL. EOMI. Head: Atraumatic. ENT:      Ears:      Nose: No congestion/rhinnorhea.      Mouth/Throat: Mucous membranes are moist.  Neck: No stridor. Cardiovascular: Normal rate, regular rhythm.  Good peripheral circulation. Respiratory: Normal respiratory effort without tachypnea or retractions. Lungs CTAB. Good air entry to the bases with no  decreased or absent breath sounds. Musculoskeletal: Full range of motion to all extremities. No gross deformities appreciated. Neurologic:  Normal speech and language. No gross focal neurologic deficits are appreciated.  Skin:  Skin is warm, dry. 1 inch laceration to plantar left foot. Psychiatric: Mood and affect are normal. Speech and behavior are normal. Patient exhibits appropriate insight and judgement.   ____________________________________________   LABS (all labs ordered are listed, but only abnormal results are displayed)  Labs Reviewed - No data to display ____________________________________________  EKG   ____________________________________________  RADIOLOGY   No results found.  ____________________________________________    PROCEDURES  Procedure(s) performed:    Procedures    Medications  Tdap (BOOSTRIX) injection 0.5 mL (has no administration in time range)  cephALEXin (KEFLEX) capsule  500 mg (500 mg Oral Given 05/04/20 1949)  traMADol (ULTRAM) tablet 50 mg (50 mg Oral Given 05/04/20 1949)     ____________________________________________   INITIAL IMPRESSION / ASSESSMENT AND PLAN / ED COURSE  Pertinent labs & imaging results that were available during my care of the patient were reviewed by me and considered in my medical decision making (see chart for details).  Review of the [place] CSRS was performed in accordance of the [facility] prior to dispensing any controlled drugs.   Patient's diagnosis is consistent with foot laceration. Laceration is 20 hours old and was therefore not repaired with sutures. Wound was cleaned with 1L NS and betadine. Steri strip was placed. Tetanus shot was updated. Patient will be discharged home with prescriptions for keflex. Patient is to follow up with PCP as directed. Patient is given ED precautions to return to the ED for any worsening or new symptoms.  Karalynn Cottone was evaluated in Emergency Department on 05/04/2020  for the symptoms described in the history of present illness. She was evaluated in the context of the global COVID-19 pandemic, which necessitated consideration that the patient might be at risk for infection with the SARS-CoV-2 virus that causes COVID-19. Institutional protocols and algorithms that pertain to the evaluation of patients at risk for COVID-19 are in a state of rapid change based on information released by regulatory bodies including the CDC and federal and state organizations. These policies and algorithms were followed during the patient's care in the ED.   ____________________________________________  FINAL CLINICAL IMPRESSION(S) / ED DIAGNOSES  Final diagnoses:  Laceration of right foot, initial encounter      NEW MEDICATIONS STARTED DURING THIS VISIT:  ED Discharge Orders         Ordered    cephALEXin (KEFLEX) 500 MG capsule  4 times daily     Discontinue  Reprint     05/04/20 1954    acetaminophen (TYLENOL) 500 MG tablet  Every 6 hours PRN     Discontinue  Reprint     05/04/20 1954              This chart was dictated using voice recognition software/Dragon. Despite best efforts to proofread, errors can occur which can change the meaning. Any change was purely unintentional.    Enid Derry, PA-C 05/04/20 2016    Dionne Bucy, MD 05/04/20 [id number]

## 2020-05-04 NOTE — Progress Notes (Signed)
Ms. Hopkinson called from the [facility] ED in relation to known neck and bilateral arm pain. I spoke to her. She said that she is in the ED due to a "laceration" on her foot but thought to reach out to [place] while she was in the ED. As previously noted, she is well known to our clinic for chronic neck and b/l arm pain, numbness, tingling, and is s/p C3-7 ACDF in 2019. She reports that her R arm pain has gotten worse but otherwise her symptoms are stable. During our last visit in June, we ordered an MRI and CT to be completed as outpatient to evaluate her symptoms, however she reportedly has been unable to keep these appointments. She said she has these scheduled for end of August. We also referred her to pain medicine in order to address her chronic pain needs but she was unable to keep this appointment as well.  I discussed this with Dr. Myer Haff. We will manage her chronic neck/arm complaints as outpatient, and will f/u with tests as they are completed as outpatient.    Patsey Berthold, NP Department of Neurosurgery

## 2020-05-17 ENCOUNTER — Ambulatory Visit: Admission: RE | Admit: 2020-05-17 | Payer: Medicare Other | Source: Ambulatory Visit

## 2020-09-02 ENCOUNTER — Encounter (HOSPITAL_COMMUNITY): Payer: Self-pay | Admitting: Pharmacy Technician

## 2020-09-02 ENCOUNTER — Emergency Department (HOSPITAL_COMMUNITY)
Admission: EM | Admit: 2020-09-02 | Discharge: 2020-09-02 | Disposition: A | Discharge status: Home / Self Care | Payer: Medicare Other | Attending: Emergency Medicine | Admitting: Emergency Medicine

## 2020-09-02 DIAGNOSIS — F17210 Nicotine dependence, cigarettes, uncomplicated: Secondary | ICD-10-CM | POA: Insufficient documentation

## 2020-09-02 DIAGNOSIS — R220 Localized swelling, mass and lump, head: Secondary | ICD-10-CM | POA: Diagnosis present

## 2020-09-02 DIAGNOSIS — E039 Hypothyroidism, unspecified: Secondary | ICD-10-CM | POA: Diagnosis not present

## 2020-09-02 DIAGNOSIS — K0889 Other specified disorders of teeth and supporting structures: Secondary | ICD-10-CM | POA: Diagnosis not present

## 2020-09-02 LAB — COMPREHENSIVE METABOLIC PANEL
ALT: 13 U/L (ref 0–44)
AST: 18 U/L (ref 15–41)
Albumin: 3.7 g/dL (ref 3.5–5.0)
Alkaline Phosphatase: 122 U/L (ref 38–126)
Anion gap: 11 (ref 5–15)
BUN: 8 mg/dL (ref 6–20)
CO2: 25 mmol/L (ref 22–32)
Calcium: 9.7 mg/dL (ref 8.9–10.3)
Chloride: 103 mmol/L (ref 98–111)
Creatinine, Ser: 0.88 mg/dL (ref 0.44–1.00)
GFR, Estimated: >60 mL/min (ref >60)
Glucose, Bld: 86 mg/dL (ref 70–99)
Potassium: 5 mmol/L (ref 3.5–5.1)
Sodium: 139 mmol/L (ref 135–145)
Total Bilirubin: 0.7 mg/dL (ref 0.3–1.2)
Total Protein: 7.5 g/dL (ref 6.5–8.1)

## 2020-09-02 LAB — CBC WITH DIFFERENTIAL/PLATELET
Abs Immature Granulocytes: 0.05 10*3/uL (ref 0.00–0.07)
Basophils Absolute: 0.1 10*3/uL (ref 0.0–0.1)
Basophils Relative: 1 %
Eosinophils Absolute: 0.1 10*3/uL (ref 0.0–0.5)
Eosinophils Relative: 1 %
HCT: 39.7 % (ref 36.0–46.0)
Hemoglobin: 13.5 g/dL (ref 12.0–15.0)
Immature Granulocytes: 0 %
Lymphocytes Relative: 24 %
Lymphs Abs: 3.3 10*3/uL (ref 0.7–4.0)
MCH: 29.6 pg (ref 26.0–34.0)
MCHC: 34 g/dL (ref 30.0–36.0)
MCV: 87.1 fL (ref 80.0–100.0)
Monocytes Absolute: 1 10*3/uL (ref 0.1–1.0)
Monocytes Relative: 7 %
Neutro Abs: 9.4 10*3/uL — ABNORMAL HIGH (ref 1.7–7.7)
Neutrophils Relative %: 67 %
Platelets: 366 10*3/uL (ref 150–400)
RBC: 4.56 MIL/uL (ref 3.87–5.11)
RDW: 15.8 % — ABNORMAL HIGH (ref 11.5–15.5)
WBC: 13.9 10*3/uL — ABNORMAL HIGH (ref 4.0–10.5)
nRBC: 0 % (ref 0.0–0.2)

## 2020-09-02 MED ORDER — HYDROCODONE-ACETAMINOPHEN 5-325 MG PO TABS
2.0000 | ORAL_TABLET | Freq: Once | ORAL | Status: AC
  Administered 2020-09-02: 2 via ORAL
  Filled 2020-09-02: qty 2

## 2020-09-02 MED ORDER — AMOXICILLIN-POT CLAVULANATE 875-125 MG PO TABS
1.0000 | ORAL_TABLET | Freq: Once | ORAL | Status: AC
  Administered 2020-09-02: 1 via ORAL
  Filled 2020-09-02: qty 1

## 2020-09-02 MED ORDER — TRAMADOL HCL 50 MG PO TABS: 50.0000 mg | ORAL_TABLET | Freq: Four times a day (QID) | ORAL | 0 refills | Status: DC | PRN

## 2020-09-02 MED ORDER — AMOXICILLIN-POT CLAVULANATE 875-125 MG PO TABS
1.0000 | ORAL_TABLET | Freq: Two times a day (BID) | ORAL | 0 refills | Status: DC
Start: 2020-09-02 — End: 2022-05-01

## 2020-09-02 NOTE — Discharge Instructions (Signed)
Take antibiotics as prescribed and follow-up closely with dentist/primary care doctor. Return for worsening swelling, breathing difficulty, persistent vomiting or new concerns. Take Tylenol every 4 hours as needed for fever. For severe pain take tramadol however realize they have the potential for addiction and it can make you sleepy and has tylenol in it.  No operating machinery while taking.

## 2020-09-02 NOTE — ED Triage Notes (Signed)
Pt here with R sided mouth swelling and dental pain onset yesterday. Hx dental abcess.

## 2020-09-02 NOTE — ED Provider Notes (Signed)
[facility] EMERGENCY DEPARTMENT Provider Note   CSN: [id number] Arrival date & time: 09/02/20  1057     History Chief Complaint  Patient presents with  . Dental Pain    Mercedes Forbes is a 49 y.o. female.  Patient with history of multiple dental infections, bipolar presents with worsening right upper dental pain and swelling since yesterday.  History of similar.  Multiple dental procedures in the past.  No fevers chills or vomiting.  Patient has allergy to ibuprofen/NSAIDs.  No breathing difficulty.  No current antibiotics.  Patient has seen a dentist in the past.        Past Medical History:  Diagnosis Date  . Allergy   . Anxiety   . Arthritis   . Bipolar 1 disorder (HCC)   . Depression   . GERD (gastroesophageal reflux disease)   . Headache    migraines  . Personality disorder (HCC)   . Polysubstance abuse (HCC)   . Schizophrenia [facility])     Patient Active Problem List   Diagnosis Date Noted  . Cervical radiculopathy 08/06/2018  . Chronic upper extremity pain (Secondary Area of Pain) (Bilateral) (R>L) 07/28/2018  . DDD (degenerative disc disease), cervical 07/28/2018  . Chronic neck pain (Primary Area of Pain) (Bilateral) (R>L) 07/15/2018  . Chronic knee pain [facility] Area of Pain) (Right) 07/15/2018  . Chronic low back pain ([name]) (Bilateral) (R>L) w/ sciatica (Bilateral) 07/15/2018  . Chronic lower extremity pain (Bilateral) (R>L) 07/15/2018  . Chronic hand pain (Bilateral) (L>R) 07/15/2018  . Chronic pain syndrome 07/15/2018  . Long term current use of opiate analgesic 07/15/2018  . Pharmacologic therapy 07/15/2018  . Disorder of skeletal system 07/15/2018  . Problems influencing health status 07/15/2018  . Lower extremity weakness 07/15/2018  . Arm numbness 04/21/2018  . 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 Hypothyroidism 03/08/2017  . Opiate abuse, episodic (HCC) 07/22/2016  . PTSD (post-traumatic stress disorder)  04/04/2016  . Tobacco use disorder 04/03/2016  . Bipolar 2 disorder, major depressive episode (HCC) 04/02/2016  . Cocaine use disorder, moderate, dependence (HCC) 04/02/2016  . Cannabis use disorder, moderate, dependence (HCC) 04/02/2016    Past Surgical History:  Procedure Laterality Date  . ANTERIOR CERVICAL DECOMPRESSION/DISCECTOMY FUSION 4 LEVELS N/A 08/06/2018   Procedure: ANTERIOR CERVICAL DECOMPRESSION/DISCECTOMY FUSION 4 LEVELS-c3-7;  Surgeon: Venetia Night, MD;  Location: [facility] ORS;  Service: Neurosurgery;  Laterality: N/A;  . KNEE ARTHROSCOPY WITH PATELLA RECONSTRUCTION     left patella removed  . wrists Bilateral      OB History   No obstetric history on file.     Family History  Problem Relation Age of Onset  . Heart disease Mother   . Hyperlipidemia Mother   . Hypertension Mother   . Kidney disease Mother   . Arthritis Mother   . Stroke Father   . Diabetes Father     Social History   Tobacco Use  . Smoking status: Current Every Day Smoker    Packs/day: 0.25    Types: Cigarettes  . Smokeless tobacco: Never Used  . Tobacco comment: Nicotine patch was ordered  Vaping Use  . Vaping Use: Never used  Substance Use Topics  . Alcohol use: No  . Drug use: Not Currently    Types: Cocaine, Amphetamines, Marijuana    Comment: last 1 month ago    Home Medications Prior to Admission medications   Medication Sig Start Date End Date Taking? Authorizing Provider  acetaminophen (TYLENOL) 500  MG tablet Take 1 tablet (500 mg total) by mouth every 6 (six) hours as needed. 05/04/20   Enid Derry, PA-C  amoxicillin-clavulanate (AUGMENTIN) 875-125 MG tablet Take 1 tablet by mouth 2 (two) times daily. One po bid x 7 days 09/02/20   Blane Ohara, MD  busPIRone (BUSPAR) 30 MG tablet Take 30 mg by mouth 3 (three) times daily.     [provider]  DULoxetine (CYMBALTA) 60 MG capsule Take 120 mg by mouth daily.     [provider]  gabapentin  (NEURONTIN) 300 MG capsule Take 300 mg by mouth 3 (three) times daily.    [provider]  hydrOXYzine (ATARAX/VISTARIL) 50 MG tablet Take 50 mg by mouth 3 (three) times daily.     [provider]  phenylephrine (SUDAFED PE) 10 MG TABS tablet Take 10 mg by mouth every 4 (four) hours as needed (for nasal congestion).    [provider]  ranitidine (ZANTAC) 150 MG capsule Take 150 mg by mouth 2 (two) times daily as needed for heartburn.     [provider]  traMADol (ULTRAM) 50 MG tablet Take 1 tablet (50 mg total) by mouth every 6 (six) hours as needed. 09/02/20   Blane Ohara, MD  traZODone (DESYREL) 100 MG tablet Take 1 tablet (100 mg total) by mouth at bedtime. 04/19/17   [name], MD    Allergies    Aspirin, Flurbiprofen, and Nsaids  Review of Systems   Review of Systems  Constitutional: Negative for chills and fever.  HENT: Positive for congestion and dental problem.   Eyes: Negative for visual disturbance.  Respiratory: Negative for shortness of breath.   Cardiovascular: Negative for chest pain.  Gastrointestinal: Negative for abdominal pain and vomiting.  Genitourinary: Negative for dysuria and flank pain.  Musculoskeletal: Negative for back pain, neck pain and neck stiffness.  Skin: Negative for rash.  Neurological: Positive for headaches. Negative for light-headedness.    Physical Exam Updated Vital Signs BP 132/82 (BP Location: Right Arm)   Pulse 75   Temp 99.2 F (37.3 C) (Oral)   Resp 14   SpO2 100%   Physical Exam Vitals and nursing note reviewed.  Constitutional:      Appearance: She is well-developed and well-nourished.  HENT:     Head: Normocephalic.     Comments: Patient has mild swelling and warmth to right maxillary area, eye movements intact without pain, no drainage from right eye, mild tenderness and swelling right upper gingiva without palpable abscess.  Neck supple.  No trismus. Eyes:     General:         Right eye: No discharge.        Left eye: No discharge.     Conjunctiva/sclera: Conjunctivae normal.  Neck:     Trachea: No tracheal deviation.  Cardiovascular:     Rate and Rhythm: Normal rate.  Pulmonary:     Effort: Pulmonary effort is normal.  Abdominal:     General: There is no distension.     Palpations: Abdomen is soft.     Tenderness: There is no abdominal tenderness. There is no guarding.  Musculoskeletal:        General: No edema.     Cervical back: Normal range of motion and neck supple.  Lymphadenopathy:     Cervical: No cervical adenopathy.  Skin:    General: Skin is warm.     Findings: No rash.  Neurological:     Mental Status: She is alert  and oriented to person, place, and time.  Psychiatric:        Mood and Affect: Mood and affect normal.     ED Results / Procedures / Treatments   Labs (all labs ordered are listed, but only abnormal results are displayed) Labs Reviewed  CBC WITH DIFFERENTIAL/PLATELET - Abnormal; Notable for the following components:      Result Value   WBC 13.9 (*)    RDW 15.8 (*)    Neutro Abs 9.4 (*)    All other components within normal limits  COMPREHENSIVE METABOLIC PANEL    EKG None  Radiology No results found.  Procedures Procedures (including critical care time)  Medications Ordered in ED Medications  amoxicillin-clavulanate (AUGMENTIN) 875-125 MG per tablet 1 tablet (1 tablet Oral Given 09/02/20 2120)  HYDROcodone-acetaminophen (NORCO/VICODIN) 5-325 MG per tablet 2 tablet (2 tablets Oral Given 09/02/20 2121)    ED Course  I have reviewed the triage vital signs and the nursing notes.  Pertinent labs & imaging results that were available during my care of the patient were reviewed by me and considered in my medical decision making (see chart for details).    MDM Rules/Calculators/A&P                          Patient presents with worsening dental pain and swelling since yesterday and history of multiple  infections.  Patient has not used illegal drugs in 3 years. Pain meds and first dose oral antibiotics given in the ER, stressed follow-up with dentist and oral antibiotics for home.    Final Clinical Impression(s) / ED Diagnoses Final diagnoses:  Pain, dental  Facial swelling    Rx / DC Orders ED Discharge Orders         Ordered    amoxicillin-clavulanate (AUGMENTIN) 875-125 MG tablet  2 times daily        09/02/20 2100    traMADol (ULTRAM) 50 MG tablet  Every 6 hours PRN        09/02/20 2102           Blane Ohara, MD 09/02/20 2127

## 2020-09-06 ENCOUNTER — Ambulatory Visit
Admission: RE | Admit: 2020-09-06 | Discharge: 2020-09-06 | Disposition: A | Discharge status: Home / Self Care | Payer: Medicare Other | Source: Ambulatory Visit | Attending: Neurosurgery | Admitting: Neurosurgery

## 2020-09-06 ENCOUNTER — Ambulatory Visit
Admission: RE | Admit: 2020-09-06 | Discharge: 2020-09-06 | Disposition: A | Discharge status: Home / Self Care | Payer: Medicare Other | Source: Ambulatory Visit | Attending: Nurse Practitioner | Admitting: Nurse Practitioner

## 2020-09-06 ENCOUNTER — Other Ambulatory Visit: Payer: Self-pay

## 2020-09-06 DIAGNOSIS — Z981 Arthrodesis status: Secondary | ICD-10-CM | POA: Insufficient documentation

## 2020-09-06 DIAGNOSIS — M5412 Radiculopathy, cervical region: Secondary | ICD-10-CM | POA: Insufficient documentation

## 2021-02-08 IMAGING — CT CT CERVICAL SPINE W/O CM
3 of 4 series · 11 of 33 positions shown, 13 images · non-contrast
Comparison: MRI of the cervical spine 03/19/18 and 08/07/2018.

CLINICAL DATA: Cervical spine radiculopathy. Cervical spine fusion
7982.

EXAM:
CT CERVICAL SPINE WITHOUT CONTRAST
TECHNIQUE: Multidetector CT imaging of the cervical spine was performed without
intravenous contrast. Multiplanar CT image reconstructions were also
generated.

[Series 4: sagittal bone · sagittal · 0.26mm/px · 5 of 56 slices shown, 6 images]
[im 19/56  bone]
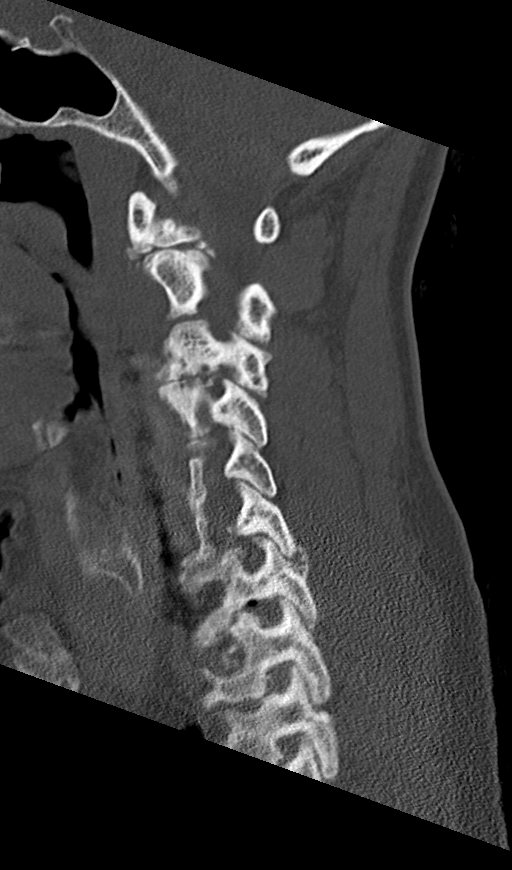
[im 23/56  bone]
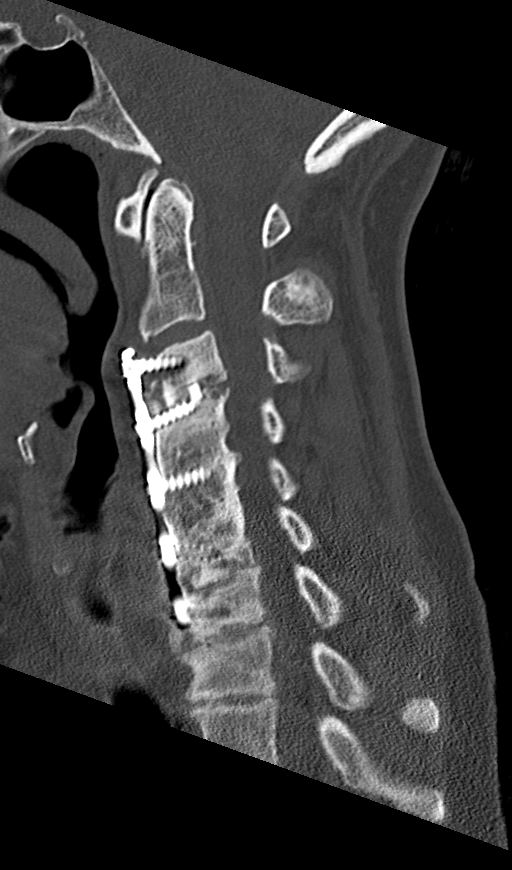
[im 28/56  soft-tissue]
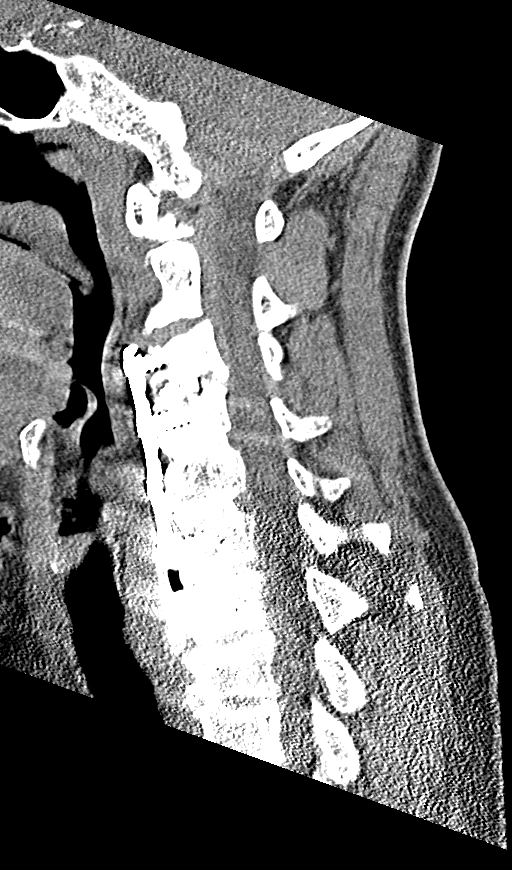
[im 28/56  bone]
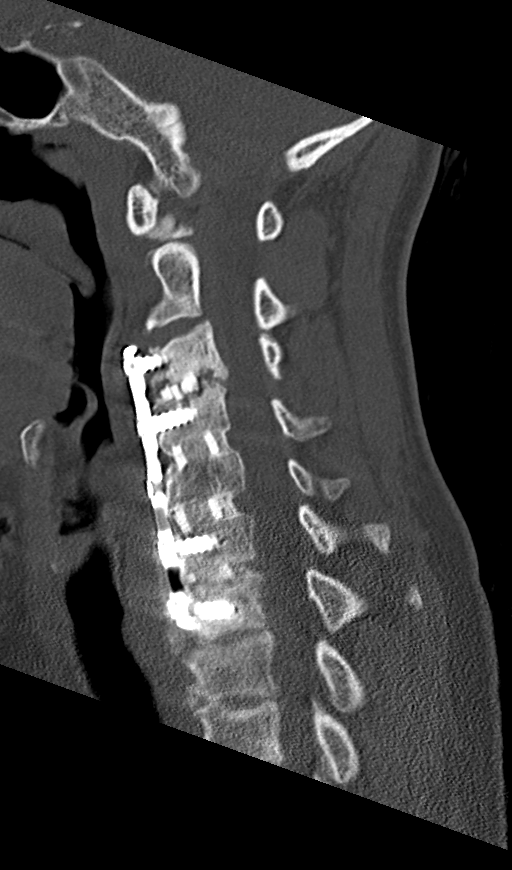
[im 33/56  bone]
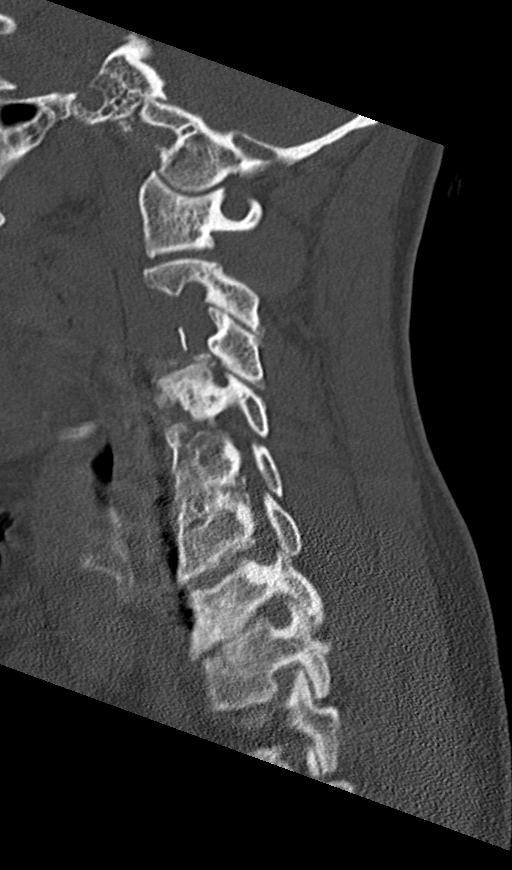
[im 37/56  bone]
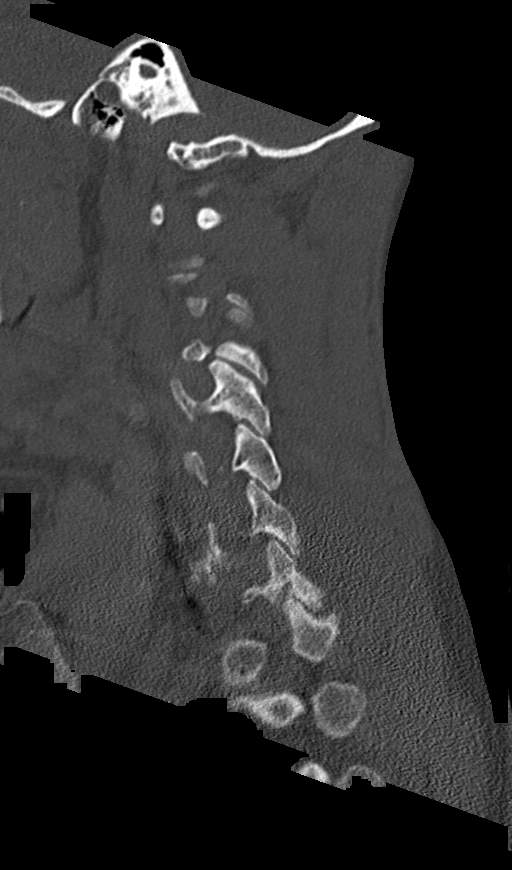

[Series 5: coronal bone · coronal · 0.24mm/px · 3 of 59 slices shown]
[im 15/59  bone]
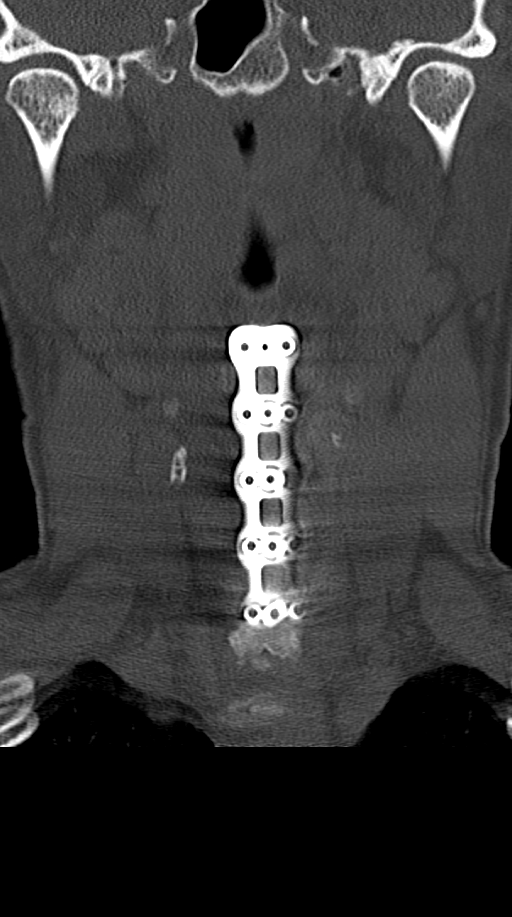
[im 25/59  bone]
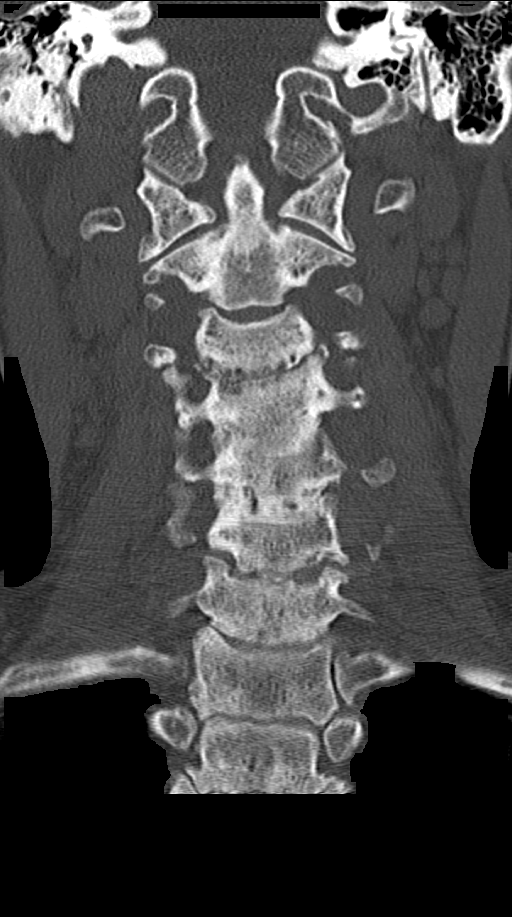
[im 35/59  bone]
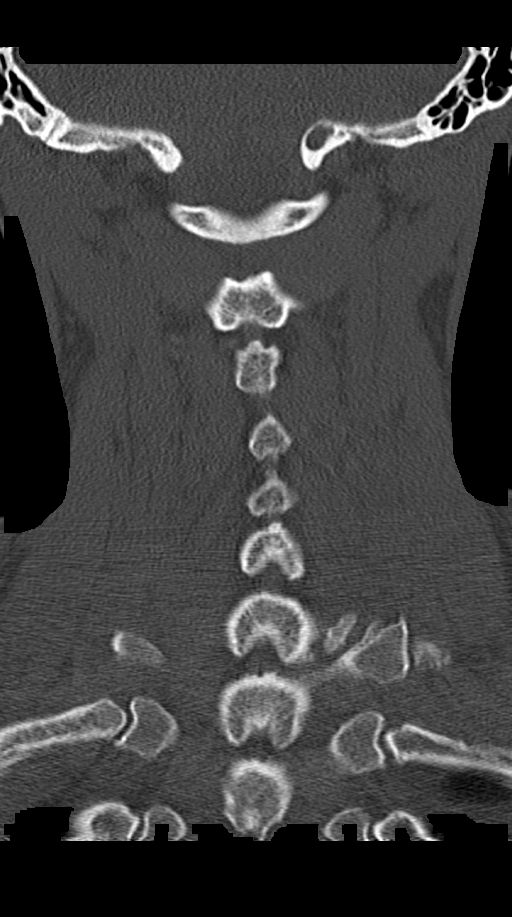

[Series 6: orthogonal bone · axial · 0.23mm/px · z∈[-236,-132]mm · 3 of 105 slices shown, 4 images]
[im 30/105  soft-tissue]
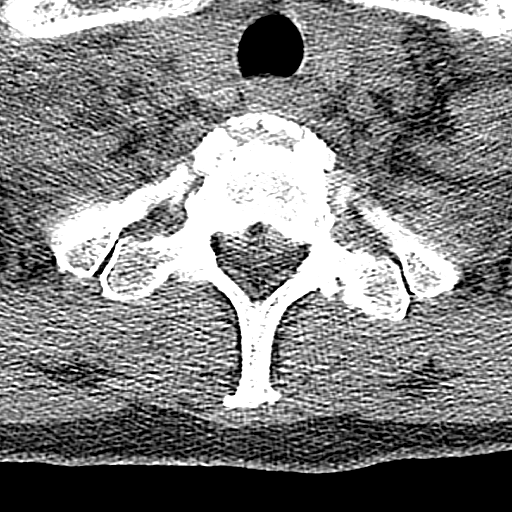
[im 30/105  bone]
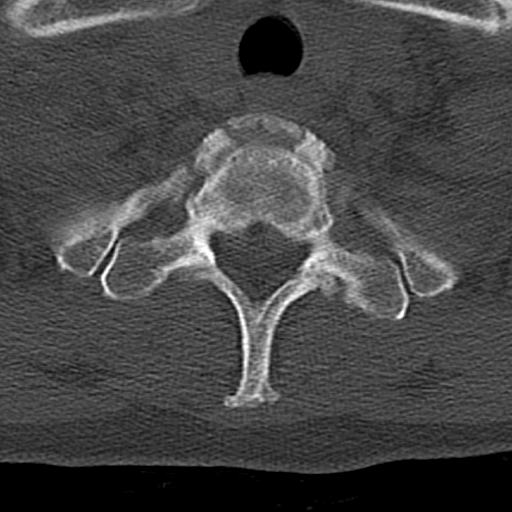
[im 60/105  bone]
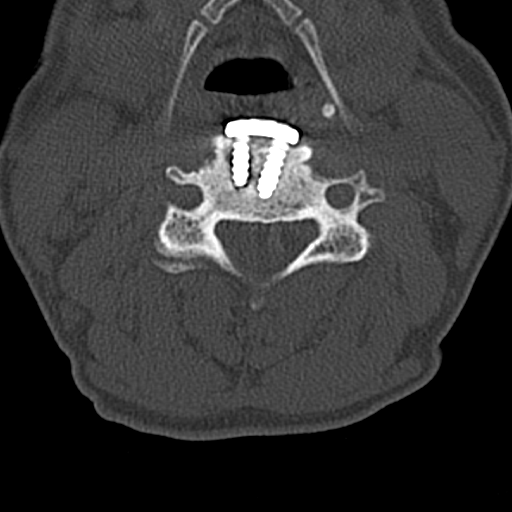
[im 90/105  bone]
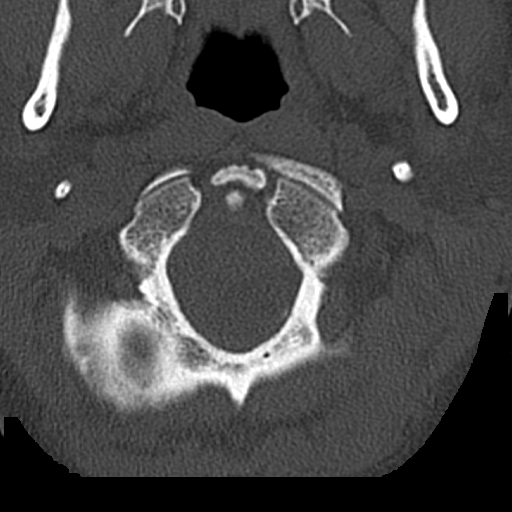

[11 of 33 positions shown; findings below may reference images not displayed]

FINDINGS: Alignment: No significant listhesis is present.

Skull base and vertebrae: Craniocervical junction is normal.
Vertebral body heights are within normal limits.

Soft tissues and spinal canal: The paraspinous soft tissues within
normal limits. Central canal is unremarkable. Soft tissues the neck
are unremarkable.

Disc levels: Anterior plate and screw hardware is present C3-7.
Solid fusion is present across the disc space at C4-5 and C5-6.
There is some bridging bone across the disc space at C3-4. Also
bridging bone extending across the disc space at C6-7. There is
lucency about the C3 screws into lesser extent about the right screw
at C4. Hardware is intact.

Severe right and moderate left foraminal narrowing is present at
C3-4.

Multilevel foraminal narrowing is otherwise stable.

Upper chest: Lung apices are clear. Thoracic inlet is within normal
limits.

Other:
IMPRESSION: 1. Anterior plate and screw hardware C3-7.
2. Fusion across the disc spaces C3-7. Most extensive fusion is at
C4-5 and C5-6. There is some bridging bone at C3-4 and at C6-7.
3. Lucency about the C3 screws into lesser extent about the right
screw at C4. This suggests loosening. It may be related to delayed
fusion. There now appears to be fusion across the disc space with
some bridging bone.
4. Severe right and moderate left foraminal narrowing at C3-4 is
stable.
5. Multilevel foraminal narrowing is otherwise stable.

## 2022-02-16 ENCOUNTER — Telehealth: Payer: Self-pay | Admitting: Psychiatry
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
## 2022-03-06 ENCOUNTER — Telehealth: Payer: Self-pay

## 2022-03-06 ENCOUNTER — Other Ambulatory Visit: Payer: Self-pay

## 2022-03-06 DIAGNOSIS — Z1211 Encounter for screening for malignant neoplasm of colon: Secondary | ICD-10-CM

## 2022-03-06 MED ORDER — PEG 3350-KCL-NA BICARB-NACL 420 G PO SOLR: 4000.0000 mL | Freq: Once | ORAL | 0 refills | Status: AC

## 2022-03-06 NOTE — Telephone Encounter (Signed)
Gastroenterology Pre-Procedure Review  Request Date: 06/29 Requesting Physician: Dr. Tobi Bastos  PATIENT REVIEW QUESTIONS: The patient responded to the following health history questions as indicated:    1. Are you having any GI issues? Blood in toilet upon having a bowel movement this has occurred more than once 2. Do you have a personal history of Polyps? no 3. Do you have a family history of Colon Cancer or Polyps? yes (mother colon cancer and maternal uncles, father colon polyps) 4. Diabetes Mellitus? no 5. Joint replacements in the past 12 months?no 6. Major health problems in the past 3 months?no 7. Any artificial heart valves, MVP, or defibrillator?no    MEDICATIONS & ALLERGIES:    Patient reports the following regarding taking any anticoagulation/antiplatelet therapy:   Plavix, Coumadin, Eliquis, Xarelto, Lovenox, Pradaxa, Brilinta, or Effient? no Aspirin? no  Patient confirms/reports the following medications:  Current Outpatient Medications  Medication Sig Dispense Refill   acetaminophen (TYLENOL) 500 MG tablet Take 1 tablet (500 mg total) by mouth every 6 (six) hours as needed. 30 tablet 0   amoxicillin-clavulanate (AUGMENTIN) 875-125 MG tablet Take 1 tablet by mouth 2 (two) times daily. One po bid x 7 days 14 tablet 0   busPIRone (BUSPAR) 30 MG tablet Take 30 mg by mouth 3 (three) times daily.      DULoxetine (CYMBALTA) 60 MG capsule Take 120 mg by mouth daily.      gabapentin (NEURONTIN) 300 MG capsule Take 300 mg by mouth 3 (three) times daily.     hydrOXYzine (ATARAX/VISTARIL) 50 MG tablet Take 50 mg by mouth 3 (three) times daily.      phenylephrine (SUDAFED PE) 10 MG TABS tablet Take 10 mg by mouth every 4 (four) hours as needed (for nasal congestion).     ranitidine (ZANTAC) 150 MG capsule Take 150 mg by mouth 2 (two) times daily as needed for heartburn.      traMADol (ULTRAM) 50 MG tablet Take 1 tablet (50 mg total) by mouth every 6 (six) hours as needed. 10 tablet 0    traZODone (DESYREL) 100 MG tablet Take 1 tablet (100 mg total) by mouth at bedtime. 30 tablet 0   No current facility-administered medications for this visit.    Patient confirms/reports the following allergies:  Allergies  Allergen Reactions   Aspirin Other (See Comments) and Shortness Of Breath    Shortness of breath like an asthma attack   Flurbiprofen Shortness Of Breath   Nsaids Shortness Of Breath    No orders of the defined types were placed in this encounter.   AUTHORIZATION INFORMATION Primary Insurance: 1D#: Group #:  Secondary Insurance: 1D#: Group #:  SCHEDULE INFORMATION: Date: 03/15/22 Time: Location: [facility]

## 2022-03-06 NOTE — Telephone Encounter (Signed)
Patients call has been returned.  She requested to change her procedure date from 03/15/22 with Dr. Tobi Bastos at [facility] to 04/02/22 at [facility] with Dr. Tobi Bastos.  Vikki in Endo has been notified of date change.  Instructions have been updated to reflect the new procedure date.  Thanks, [place], [place]

## 2022-03-07 ENCOUNTER — Encounter: Payer: Self-pay | Admitting: Psychiatry

## 2022-03-07 ENCOUNTER — Ambulatory Visit (INDEPENDENT_AMBULATORY_CARE_PROVIDER_SITE_OTHER): Payer: Medicare Other | Admitting: Psychiatry

## 2022-03-07 DIAGNOSIS — F411 Generalized anxiety disorder: Secondary | ICD-10-CM | POA: Diagnosis not present

## 2022-03-07 DIAGNOSIS — G4700 Insomnia, unspecified: Secondary | ICD-10-CM

## 2022-03-07 DIAGNOSIS — F3181 Bipolar II disorder: Secondary | ICD-10-CM | POA: Diagnosis not present

## 2022-03-07 DIAGNOSIS — F4310 Post-traumatic stress disorder, unspecified: Secondary | ICD-10-CM | POA: Diagnosis not present

## 2022-03-07 MED ORDER — GABAPENTIN 300 MG PO CAPS: 300.0000 mg | ORAL_CAPSULE | Freq: Three times a day (TID) | ORAL | 2 refills | Status: DC

## 2022-03-07 MED ORDER — BUSPIRONE HCL 15 MG PO TABS: 30.0000 mg | ORAL_TABLET | Freq: Two times a day (BID) | ORAL | 2 refills | Status: DC

## 2022-03-07 NOTE — Patient Instructions (Signed)
Continue duloxetine 60 mg daily - refill left Continue lamotrigine 100 mg daily - refill left Continue depakote 1000 mg at night - refill left Continue buspirone 15 mg twice day Continue gabapentin 300 mg three times a day Continue amitriptyline 50 mg at night - refill left Please send a lab result (VPA, CMP, Plt, TSH) to our office Next appointment: 7/21 at 9:30, video

## 2022-04-02 ENCOUNTER — Encounter: Admission: RE | Payer: Self-pay | Source: Home / Self Care

## 2022-04-02 ENCOUNTER — Ambulatory Visit: Admission: RE | Admit: 2022-04-02 | Payer: Medicare Other | Source: Home / Self Care | Admitting: Gastroenterology

## 2022-04-02 ENCOUNTER — Telehealth: Payer: Self-pay

## 2022-04-02 SURGERY — COLONOSCOPY WITH PROPOFOL
Anesthesia: General

## 2022-04-02 NOTE — Telephone Encounter (Signed)
Patient lvm Friday  afternoon 03/30/22 requested to cancel Monday 04/02/22 Colonoscopy.  She said she would call back later to reschedule.  Thanks,  [place], [place]

## 2022-04-03 ENCOUNTER — Telehealth: Payer: Self-pay | Admitting: Psychiatry

## 2022-04-03 NOTE — Telephone Encounter (Signed)
Obtained blood test result done on 12/2021.   ALT 13, (other LFT are not available) VPA 59 THS 2.87 CBC Plt 288
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
## 2022-04-06 ENCOUNTER — Telehealth (INDEPENDENT_AMBULATORY_CARE_PROVIDER_SITE_OTHER): Payer: Medicare Other | Admitting: Psychiatry

## 2022-04-06 ENCOUNTER — Encounter: Payer: Self-pay | Admitting: Psychiatry

## 2022-04-06 DIAGNOSIS — F3181 Bipolar II disorder: Secondary | ICD-10-CM | POA: Diagnosis not present

## 2022-04-06 DIAGNOSIS — F4310 Post-traumatic stress disorder, unspecified: Secondary | ICD-10-CM

## 2022-04-06 DIAGNOSIS — G4700 Insomnia, unspecified: Secondary | ICD-10-CM

## 2022-04-06 DIAGNOSIS — F411 Generalized anxiety disorder: Secondary | ICD-10-CM

## 2022-04-06 MED ORDER — LAMOTRIGINE 100 MG PO TABS: 100.0000 mg | ORAL_TABLET | Freq: Every day | ORAL | 2 refills | Status: DC

## 2022-04-06 MED ORDER — AMITRIPTYLINE HCL 25 MG PO TABS: 25.0000 mg | ORAL_TABLET | Freq: Every day | ORAL | 0 refills | Status: DC

## 2022-04-06 MED ORDER — DULOXETINE HCL 60 MG PO CPEP: 60.0000 mg | ORAL_CAPSULE | Freq: Every day | ORAL | 2 refills | Status: DC

## 2022-04-06 MED ORDER — PRAZOSIN HCL 1 MG PO CAPS: ORAL_CAPSULE | ORAL | 0 refills | Status: DC

## 2022-04-06 MED ORDER — ESZOPICLONE 1 MG PO TABS: 1.0000 mg | ORAL_TABLET | Freq: Every evening | ORAL | 0 refills | Status: DC | PRN

## 2022-04-06 NOTE — Patient Instructions (Signed)
Continue duloxetine 60 mg daily  Continue lamotrigine 100 mg daily  Continue depakote 1000 mg at night  Continue buspirone 15 mg twice day Continue gabapentin 300 mg three times a day Discontinue amitriptyline 25 mg at night for one week, then discontinue  Start lunesta 1-2 mg at night as needed for insomnia   Start prazosin 1 mg at night for 3 days, then 2 mg at night  Next appointment: 8/15 at 9:30

## 2022-04-09 ENCOUNTER — Other Ambulatory Visit: Payer: Self-pay | Admitting: Psychiatry

## 2022-04-12 ENCOUNTER — Telehealth: Payer: Self-pay | Admitting: Psychiatry

## 2022-04-12 ENCOUNTER — Telehealth: Payer: Self-pay

## 2022-04-12 MED ORDER — ZALEPLON 5 MG PO CAPS: 5.0000 mg | ORAL_CAPSULE | Freq: Every evening | ORAL | 0 refills | Status: DC | PRN

## 2022-04-12 NOTE — Telephone Encounter (Signed)
Discussed with the patient.  She states that she has double vision and headache especially when she is dehydrated. She has been on lamotrigine for many years, and has been taking it consistently.  She is unsure if this is related to lamotrigine.  Informed the patient that those symptoms are less common side effect from lamotrigine especially given she has been on this medication for many years.  She agrees to discuss with her PCP/also urgent care/ED if any worsening. She also states that she has not being able to get Lunesta due to issues with insurance.  She agrees that our office will look into it to see if prior authorization is needed.

## 2022-04-12 NOTE — Telephone Encounter (Signed)
pt called left a message that she feels that the lamictal is causing her to have double vision and headaches. she like to speak with you about other options.  pt last see on 04-06-22 next appt 05-01-22

## 2022-04-12 NOTE — Telephone Encounter (Signed)
called patient and was going to tell her about the insurance not having the same infomation. and she states that pharmacy already reached out and she already got her medication.

## 2022-04-12 NOTE — Telephone Encounter (Signed)
Mercedes Forbes was not covered by her insurance. Discussed with the patient. She agreed with the following.  - Start Sonata 5 mg at night as needed for sleep.  Risks including drowsiness was discussed.

## 2022-04-16 ENCOUNTER — Other Ambulatory Visit: Payer: Self-pay | Admitting: Psychiatry

## 2022-04-16 ENCOUNTER — Telehealth: Payer: Self-pay

## 2022-04-16 MED ORDER — AMITRIPTYLINE HCL 25 MG PO TABS: 25.0000 mg | ORAL_TABLET | Freq: Every day | ORAL | 0 refills | Status: DC

## 2022-04-16 NOTE — Telephone Encounter (Signed)
Discussed with the patient. She reports worsening in pain, headache, and insomnia due to pain.  She sleeps four hours from [place].  She denies SI. She agrees to try a week of amitriptyline to mitigate withdraw symptoms, although we may consider staying on this medication in the future if she has benefit from this medication.  -Restart amitriptyline 25 mg at night for one week, then discontinue.

## 2022-04-16 NOTE — Telephone Encounter (Signed)
pt states she needs to speak with you. she states that she is only sleeping abut 4 hours and then she wide awake.  she all states she is aching all over, she has a headache, and she feels nauses.  she doesn't know if it is the amitriptline you took her off or the prazosin or sonata but she needs you to call her.

## 2022-04-28 ENCOUNTER — Other Ambulatory Visit: Payer: Self-pay | Admitting: Psychiatry
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
## 2022-05-01 ENCOUNTER — Ambulatory Visit (INDEPENDENT_AMBULATORY_CARE_PROVIDER_SITE_OTHER): Payer: Medicare Other | Admitting: Psychiatry

## 2022-05-01 ENCOUNTER — Encounter: Payer: Self-pay | Admitting: Psychiatry

## 2022-05-01 VITALS — BP 94/62 | HR 79 | Temp 98.3°F | Wt 160.2 lb

## 2022-05-01 DIAGNOSIS — G4700 Insomnia, unspecified: Secondary | ICD-10-CM | POA: Diagnosis not present

## 2022-05-01 DIAGNOSIS — F3181 Bipolar II disorder: Secondary | ICD-10-CM | POA: Diagnosis not present

## 2022-05-01 DIAGNOSIS — F411 Generalized anxiety disorder: Secondary | ICD-10-CM

## 2022-05-01 DIAGNOSIS — F4310 Post-traumatic stress disorder, unspecified: Secondary | ICD-10-CM

## 2022-05-01 MED ORDER — PRAZOSIN HCL 2 MG PO CAPS: 2.0000 mg | ORAL_CAPSULE | Freq: Every day | ORAL | 1 refills | Status: DC

## 2022-05-01 MED ORDER — NORTRIPTYLINE HCL 25 MG PO CAPS: 25.0000 mg | ORAL_CAPSULE | Freq: Every day | ORAL | 1 refills | Status: DC

## 2022-05-01 MED ORDER — GABAPENTIN 300 MG PO CAPS: 300.0000 mg | ORAL_CAPSULE | Freq: Three times a day (TID) | ORAL | 2 refills | Status: DC

## 2022-05-01 NOTE — Patient Instructions (Signed)
Continue duloxetine 60 mg daily  Continue lamotrigine 100 mg daily  Continue Depakote 1000 mg at night  Continue buspirone 15 mg twice day Continue gabapentin 300 mg three times a day Start nortriptyline 25 mg at night  Discontinue amitriptyline  Discontinue Sonata Continue prazosin 2 mg at night  Next appointment: 10/11 at 2 PM

## 2022-05-11 NOTE — Telephone Encounter (Signed)
Error

## 2022-05-23 ENCOUNTER — Other Ambulatory Visit: Payer: Self-pay | Admitting: Psychiatry

## 2022-05-24 ENCOUNTER — Telehealth: Payer: Self-pay | Admitting: Psychiatry

## 2022-05-24 ENCOUNTER — Other Ambulatory Visit: Payer: Self-pay | Admitting: Psychiatry

## 2022-05-24 NOTE — Telephone Encounter (Signed)
It was ordered, and I received another message from you that she did have the refill. Thanks.

## 2022-05-24 NOTE — Telephone Encounter (Signed)
Upon scheduling a follow up appointment, patient states that [facility] has not received a refill on her Depakote 500 mg. Not sure what happened but only has 1 pill left, can you please send.

## 2022-06-15 ENCOUNTER — Other Ambulatory Visit: Payer: Self-pay | Admitting: Psychiatry

## 2022-06-27 ENCOUNTER — Other Ambulatory Visit: Payer: Self-pay | Admitting: Psychiatry

## 2022-06-29 ENCOUNTER — Other Ambulatory Visit: Payer: Self-pay | Admitting: Psychiatry

## 2022-07-01 ENCOUNTER — Emergency Department: Payer: Medicare Other

## 2022-07-01 ENCOUNTER — Other Ambulatory Visit: Payer: Self-pay

## 2022-07-01 ENCOUNTER — Emergency Department
Admission: EM | Admit: 2022-07-01 | Discharge: 2022-07-01 | Disposition: A | Discharge status: Home / Self Care | Payer: Medicare Other | Attending: Student in an Organized Health Care Education/Training Program | Admitting: Student in an Organized Health Care Education/Training Program

## 2022-07-01 DIAGNOSIS — M542 Cervicalgia: Secondary | ICD-10-CM | POA: Diagnosis not present

## 2022-07-01 DIAGNOSIS — R519 Headache, unspecified: Secondary | ICD-10-CM | POA: Insufficient documentation

## 2022-07-01 DIAGNOSIS — M25511 Pain in right shoulder: Secondary | ICD-10-CM | POA: Insufficient documentation

## 2022-07-01 DIAGNOSIS — W19XXXA Unspecified fall, initial encounter: Secondary | ICD-10-CM | POA: Diagnosis not present

## 2022-07-01 LAB — BASIC METABOLIC PANEL
Anion gap: 9 (ref 5–15)
BUN: 26 mg/dL — ABNORMAL HIGH (ref 6–20)
CO2: 23 mmol/L (ref 22–32)
Calcium: 8.9 mg/dL (ref 8.9–10.3)
Chloride: 105 mmol/L (ref 98–111)
Creatinine, Ser: 1.01 mg/dL — ABNORMAL HIGH (ref 0.44–1.00)
GFR, Estimated: >60 mL/min (ref >60)
Glucose, Bld: 155 mg/dL — ABNORMAL HIGH (ref 70–99)
Potassium: 3.6 mmol/L (ref 3.5–5.1)
Sodium: 137 mmol/L (ref 135–145)

## 2022-07-01 LAB — CBC WITH DIFFERENTIAL/PLATELET
Abs Immature Granulocytes: 0.02 10*3/uL (ref 0.00–0.07)
Basophils Absolute: 0.1 10*3/uL (ref 0.0–0.1)
Basophils Relative: 1 %
Eosinophils Absolute: 0.4 10*3/uL (ref 0.0–0.5)
Eosinophils Relative: 4 %
HCT: 35.2 % — ABNORMAL LOW (ref 36.0–46.0)
Hemoglobin: 11.3 g/dL — ABNORMAL LOW (ref 12.0–15.0)
Immature Granulocytes: 0 %
Lymphocytes Relative: 42 %
Lymphs Abs: 3.9 10*3/uL (ref 0.7–4.0)
MCH: 29.3 pg (ref 26.0–34.0)
MCHC: 32.1 g/dL (ref 30.0–36.0)
MCV: 91.2 fL (ref 80.0–100.0)
Monocytes Absolute: 0.4 10*3/uL (ref 0.1–1.0)
Monocytes Relative: 4 %
Neutro Abs: 4.5 10*3/uL (ref 1.7–7.7)
Neutrophils Relative %: 49 %
Platelets: 243 10*3/uL (ref 150–400)
RBC: 3.86 MIL/uL — ABNORMAL LOW (ref 3.87–5.11)
RDW: 15.4 % (ref 11.5–15.5)
WBC: 9.3 10*3/uL (ref 4.0–10.5)
nRBC: 0 % (ref 0.0–0.2)

## 2022-07-01 LAB — VALPROIC ACID LEVEL: Valproic Acid Lvl: 34 ug/mL — ABNORMAL LOW (ref 50.0–100.0)

## 2022-07-01 MED ORDER — OXYCODONE HCL 5 MG PO TABS
5.0000 mg | ORAL_TABLET | Freq: Once | ORAL | Status: AC
  Administered 2022-07-01: 5 mg via ORAL
  Filled 2022-07-01: qty 1

## 2022-07-01 MED ORDER — OXYCODONE-ACETAMINOPHEN 5-325 MG PO TABS: 1.0000 | ORAL_TABLET | ORAL | 0 refills | Status: DC | PRN

## 2022-07-01 NOTE — ED Triage Notes (Signed)
Pt with fall today with head hitting wall and right shoulder hitting floor. Pt with hx of C3-C7 fusion in 2019. Pt states since fall she has a bad HA, neck ache, and right should pain, and has some dizziness. Pt alert, able to walk in triage. Pt able to move right fingers but states it hurts too much to move right arm and shoulder.Pt states she was off balance and then fell over. Pt states she is on gabapentin and several psych meds. Pt states she often falls due to amount of medication she is on.

## 2022-07-01 NOTE — ED Provider Notes (Signed)
[facility] Provider Note    Event Date/Time   First MD Initiated Contact with Patient 07/01/22 2034     (approximate)   History   Fall   HPI  Mercedes Forbes is a 51 y.o. female presents to the ER for evaluation headache neck pain shoulder pain after mechanical fall.  Fell roughly 1 hour prior to arrival.  She felt a little bit off balance which caused her to fall to the right side.  This is happened a couple times.  She has had neck surgery in the past.  Denies any chest pain.  States that she has falls because she is on psych meds denies any recent adjustments to her medications.  States she otherwise feels well at this time.     Physical Exam   Triage Vital Signs: ED Triage Vitals  Enc Vitals Group     BP 07/01/22 1753 (!) 147/102     Pulse Rate 07/01/22 1753 83     Resp 07/01/22 1753 20     Temp 07/01/22 1753 98.2 F (36.8 C)     Temp Source 07/01/22 1753 Oral     SpO2 07/01/22 1753 97 %     Weight 07/01/22 1755 150 lb (68 kg)     Height 07/01/22 1755 5\' 7"  (1.702 m)     Head Circumference --      Peak Flow --      Pain Score 07/01/22 1754 8     Pain Loc --      Pain Edu? --      Excl. in [place]? --     Most recent vital signs: Vitals:   07/01/22 1753 07/01/22 2125  BP: (!) 147/102 109/73  Pulse: 83 80  Resp: 20 20  Temp: 98.2 F (36.8 C) 98.5 F (36.9 C)  SpO2: 97% 93%     Constitutional: Alert  Eyes: Conjunctivae are normal.  Head: Atraumatic. Nose: No congestion/rhinnorhea. Mouth/Throat: Mucous membranes are moist.   Neck: Painless ROM.  Cardiovascular:   Good peripheral circulation. Respiratory: Normal respiratory effort.  No retractions.  Gastrointestinal: Soft and nontender.  Musculoskeletal:  no deformity Neurologic:  MAE spontaneously. No gross focal neurologic deficits are appreciated.  Skin:  Skin is warm, dry and intact. No rash noted. Psychiatric: Mood and affect are normal. Speech and behavior are  normal.    ED Results / Procedures / Treatments   Labs (all labs ordered are listed, but only abnormal results are displayed) Labs Reviewed  CBC WITH DIFFERENTIAL/PLATELET - Abnormal; Notable for the following components:      Result Value   RBC 3.86 (*)    Hemoglobin 11.3 (*)    HCT 35.2 (*)    All other components within normal limits  BASIC METABOLIC PANEL - Abnormal; Notable for the following components:   Glucose, Bld 155 (*)    BUN 26 (*)    Creatinine, Ser 1.01 (*)    All other components within normal limits  VALPROIC ACID LEVEL - Abnormal; Notable for the following components:   Valproic Acid Lvl 34 (*)    All other components within normal limits     EKG     RADIOLOGY Please see ED Course for my review and interpretation.  I personally reviewed all radiographic images ordered to evaluate for the above acute complaints and reviewed radiology reports and findings.  These findings were personally discussed with the patient.  Please see medical record for radiology report.  PROCEDURES:  Critical Care performed:   Procedures   MEDICATIONS ORDERED IN ED: Medications  oxyCODONE (Oxy IR/ROXICODONE) immediate release tablet 5 mg (5 mg Oral Given 07/01/22 2058)     IMPRESSION / MDM / ASSESSMENT AND PLAN / ED COURSE  I reviewed the triage vital signs and the nursing notes.                              Differential diagnosis includes, but is not limited to, fracture, dislocation, IPH, SDH, CVA, anemia, electrolyte abnormality, contusion  Patient presented to the ER for evaluation of symptoms as described above.  She is clinically well-appearing no focal neurodeficits.  History sounds consistent with mechanical fall.  Imaging ordered triage for the above differential.  CT head on my review and interpretation does not show evidence of bleed.  No sign of fracture of the shoulder dislocation.  Remainder exam is nonfocal.  Blood work will be checked to make sure  there is no electrolyte abnormality and check her Depakote level.   Clinical Course as of 07/01/22 2230  Nancy Fetter Jul 01, 2022  2226 Patient reassessed.  Blood work is reassuring.  Does appear stable and appropriate for outpatient follow-up. [PR]    Clinical Course User Index [PR] Merlyn Lot, MD    FINAL CLINICAL IMPRESSION(S) / ED DIAGNOSES   Final diagnoses:  Fall, initial encounter     Rx / DC Orders   ED Discharge Orders          Ordered    oxyCODONE-acetaminophen (PERCOCET) 5-325 MG tablet  Every 4 hours PRN        07/01/22 2230             Note:  This document was prepared using Dragon voice recognition software and may include unintentional dictation errors.    Merlyn Lot, MD 07/01/22 2230

## 2022-07-01 NOTE — ED Provider Triage Note (Signed)
  Emergency Medicine Provider Triage Evaluation Note  Mercedes Forbes , a 51 y.o.female,  was evaluated in triage.  Pt complains of head, neck, and shoulder pain after mechanical fall today.  She states that about approximately 1 hour ago, she was getting up when she felt off balance, causing her to fall on her right side.  She states that her head hit the wall, causing her neck to be stretched laterally.  She has fusions in her cervical spine before and is concerned that she may have reinjured something.  Denies LOC.  Denies blood thinners.  She states that she is on a lot of psych meds which often causes her to become unbalanced.  Currently noticing right shoulder pain as well.   Review of Systems  Positive: Headache, neck pain, right shoulder pain Negative: Denies fever, chest pain, vomiting  Physical Exam  There were no vitals filed for this visit. Gen:   Awake, no distress   Resp:  Normal effort  MSK:   Moves extremities without difficulty.  Appears to be guarding her right shoulder. Other:    Medical Decision Making  Given the patient's initial medical screening exam, the following diagnostic evaluation has been ordered. The patient will be placed in the appropriate treatment space, once one is available, to complete the evaluation and treatment. I have discussed the plan of care with the patient and I have advised the patient that an ED physician or mid-level practitioner will reevaluate their condition after the test results have been received, as the results may give them additional insight into the type of treatment they may need.    Diagnostics: Head CT, cervical spine CT, right shoulder x-ray  Treatments: none immediately   Teodoro Spray, [place] 07/01/22 1748

## 2022-07-01 NOTE — ED Notes (Signed)
Patient transported to CT 

## 2022-07-01 NOTE — ED Notes (Signed)
Signature pad not working, pt states she will agree to stay to see an EDP.
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
## 2022-07-04 ENCOUNTER — Encounter: Payer: Self-pay | Admitting: Psychiatry

## 2022-07-04 ENCOUNTER — Telehealth (INDEPENDENT_AMBULATORY_CARE_PROVIDER_SITE_OTHER): Payer: Medicare Other | Admitting: Psychiatry

## 2022-07-04 DIAGNOSIS — F3181 Bipolar II disorder: Secondary | ICD-10-CM

## 2022-07-04 DIAGNOSIS — F411 Generalized anxiety disorder: Secondary | ICD-10-CM | POA: Diagnosis not present

## 2022-07-04 DIAGNOSIS — G4700 Insomnia, unspecified: Secondary | ICD-10-CM

## 2022-07-04 DIAGNOSIS — F4310 Post-traumatic stress disorder, unspecified: Secondary | ICD-10-CM

## 2022-07-04 MED ORDER — BUSPIRONE HCL 15 MG PO TABS: 30.0000 mg | ORAL_TABLET | Freq: Two times a day (BID) | ORAL | 0 refills | Status: DC

## 2022-07-04 MED ORDER — DULOXETINE HCL 60 MG PO CPEP: 60.0000 mg | ORAL_CAPSULE | Freq: Every day | ORAL | 0 refills | Status: DC

## 2022-07-04 NOTE — Patient Instructions (Signed)
Continue duloxetine 60 mg daily - refill left Decrease lamotrigine 50 mg daily for 1 week, then discontinue (was on 100 mg daily) Increase Depakote 1250 mg at night after obtaining LFT Continue buspirone 15 mg twice day Continue gabapentin 300 mg three times a day Continue nortriptyline 25 mg at night  Continue prazosin 2 mg at night  Next appointment: 11/15 at 4:30 PM

## 2022-07-10 ENCOUNTER — Encounter (INDEPENDENT_AMBULATORY_CARE_PROVIDER_SITE_OTHER): Payer: Self-pay

## 2022-07-11 ENCOUNTER — Telehealth: Payer: Self-pay

## 2022-07-11 ENCOUNTER — Telehealth: Payer: Self-pay | Admitting: Psychiatry

## 2022-07-11 ENCOUNTER — Other Ambulatory Visit: Payer: Self-pay | Admitting: Psychiatry

## 2022-07-11 DIAGNOSIS — F3181 Bipolar II disorder: Secondary | ICD-10-CM

## 2022-07-11 NOTE — Telephone Encounter (Signed)
pt was called and notified that orders were put in again.

## 2022-07-11 NOTE — Telephone Encounter (Signed)
pt called states that she was not able to do her labs at the [facility] because they did not have an order.

## 2022-07-11 NOTE — Telephone Encounter (Signed)
Ordered lab again. Please advice the patient to go there, and contact the office if they still have trouble with the lab order.

## 2022-07-11 NOTE — Telephone Encounter (Signed)
Lab ordered again

## 2022-07-20 ENCOUNTER — Other Ambulatory Visit: Payer: Self-pay | Admitting: Psychiatry

## 2022-07-21 ENCOUNTER — Other Ambulatory Visit (HOSPITAL_COMMUNITY): Payer: Self-pay | Admitting: Psychiatry

## 2022-07-21 DIAGNOSIS — F3181 Bipolar II disorder: Secondary | ICD-10-CM

## 2022-07-21 NOTE — Telephone Encounter (Signed)
Please call and remind her again to obtain labs at [place] (outside of [place]).

## 2022-07-22 ENCOUNTER — Other Ambulatory Visit: Payer: Self-pay | Admitting: Psychiatry

## 2022-07-23 ENCOUNTER — Other Ambulatory Visit: Payer: Self-pay | Admitting: Psychiatry

## 2022-07-23 ENCOUNTER — Other Ambulatory Visit
Admission: RE | Admit: 2022-07-23 | Discharge: 2022-07-23 | Disposition: A | Discharge status: Home / Self Care | Payer: Medicare Other | Source: Ambulatory Visit | Attending: Psychiatry | Admitting: Psychiatry

## 2022-07-23 DIAGNOSIS — F3181 Bipolar II disorder: Secondary | ICD-10-CM | POA: Diagnosis present

## 2022-07-23 LAB — COMPREHENSIVE METABOLIC PANEL
ALT: 12 U/L (ref 0–44)
AST: 18 U/L (ref 15–41)
Albumin: 3.9 g/dL (ref 3.5–5.0)
Alkaline Phosphatase: 76 U/L (ref 38–126)
Anion gap: 7 (ref 5–15)
BUN: 20 mg/dL (ref 6–20)
CO2: 29 mmol/L (ref 22–32)
Calcium: 8.9 mg/dL (ref 8.9–10.3)
Chloride: 103 mmol/L (ref 98–111)
Creatinine, Ser: 1.11 mg/dL — ABNORMAL HIGH (ref 0.44–1.00)
GFR, Estimated: 59 mL/min — ABNORMAL LOW (ref >60)
Glucose, Bld: 83 mg/dL (ref 70–99)
Potassium: 4.6 mmol/L (ref 3.5–5.1)
Sodium: 139 mmol/L (ref 135–145)
Total Bilirubin: 0.4 mg/dL (ref 0.3–1.2)
Total Protein: 7.4 g/dL (ref 6.5–8.1)

## 2022-07-23 MED ORDER — DIVALPROEX SODIUM ER 250 MG PO TB24: 250.0000 mg | ORAL_TABLET | Freq: Every day | ORAL | 0 refills | Status: DC

## 2022-07-23 NOTE — Telephone Encounter (Signed)
Contacted the patient regarding the blood test result. (She states that she just left the voice message about worsening in her mood/nightmares since tapering off lamotrigine). She verbalized understanding of the following.  Depakote-she was advised to increase up Depakote total of 1250 mg at night. She was advised to get another blood test, five days after taking this dose (check VPA, LFT) Cre-there is an elevation in creatinine.  It is not uncommon for the medication she is on to cause this. She was advised to contact her PCP for further evaluation.

## 2022-07-24 ENCOUNTER — Ambulatory Visit: Payer: Medicare Other | Admitting: Orthopedic Surgery

## 2022-07-26 ENCOUNTER — Other Ambulatory Visit: Payer: Self-pay | Admitting: Psychiatry
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
## 2022-07-31 ENCOUNTER — Other Ambulatory Visit
Admission: RE | Admit: 2022-07-31 | Discharge: 2022-07-31 | Disposition: A | Discharge status: Home / Self Care | Payer: Medicare Other | Source: Ambulatory Visit | Attending: Psychiatry | Admitting: Psychiatry

## 2022-07-31 DIAGNOSIS — F3181 Bipolar II disorder: Secondary | ICD-10-CM | POA: Diagnosis present

## 2022-07-31 LAB — HEPATIC FUNCTION PANEL
ALT: 12 U/L (ref 0–44)
AST: 23 U/L (ref 15–41)
Albumin: 3.9 g/dL (ref 3.5–5.0)
Alkaline Phosphatase: 77 U/L (ref 38–126)
Bilirubin, Direct: <0.1 mg/dL (ref 0.0–0.2)
Total Bilirubin: 0.7 mg/dL (ref 0.3–1.2)
Total Protein: 7.6 g/dL (ref 6.5–8.1)

## 2022-07-31 LAB — VALPROIC ACID LEVEL: Valproic Acid Lvl: 57 ug/mL (ref 50.0–100.0)

## 2022-08-01 ENCOUNTER — Telehealth (INDEPENDENT_AMBULATORY_CARE_PROVIDER_SITE_OTHER): Payer: Medicare Other | Admitting: Psychiatry

## 2022-08-01 ENCOUNTER — Encounter: Payer: Self-pay | Admitting: Psychiatry

## 2022-08-01 DIAGNOSIS — F4310 Post-traumatic stress disorder, unspecified: Secondary | ICD-10-CM | POA: Diagnosis not present

## 2022-08-01 DIAGNOSIS — F3181 Bipolar II disorder: Secondary | ICD-10-CM | POA: Diagnosis not present

## 2022-08-01 DIAGNOSIS — G4700 Insomnia, unspecified: Secondary | ICD-10-CM | POA: Diagnosis not present

## 2022-08-01 DIAGNOSIS — F411 Generalized anxiety disorder: Secondary | ICD-10-CM

## 2022-08-01 MED ORDER — DIVALPROEX SODIUM ER 500 MG PO TB24: 1000.0000 mg | ORAL_TABLET | Freq: Every day | ORAL | 0 refills | Status: DC

## 2022-08-01 MED ORDER — NORTRIPTYLINE HCL 25 MG PO CAPS: 25.0000 mg | ORAL_CAPSULE | Freq: Every day | ORAL | 0 refills | Status: DC

## 2022-08-01 MED ORDER — DIVALPROEX SODIUM ER 250 MG PO TB24: 250.0000 mg | ORAL_TABLET | Freq: Every day | ORAL | 0 refills | Status: DC

## 2022-08-01 NOTE — Patient Instructions (Signed)
Continue duloxetine 60 mg daily  Continue Depakote 1250 mg at night  Continue buspirone 15 mg twice day Continue gabapentin 300 mg three times a day Continue nortriptyline 25 mg at night  Continue prazosin 2 mg at night  Next appointment: 12/20 at 4 PM

## 2022-08-02 ENCOUNTER — Encounter: Payer: Self-pay | Admitting: Psychiatry

## 2022-08-03 ENCOUNTER — Other Ambulatory Visit: Payer: Self-pay | Admitting: Psychiatry

## 2022-08-16 ENCOUNTER — Other Ambulatory Visit: Payer: Self-pay | Admitting: Psychiatry

## 2022-08-16 NOTE — Telephone Encounter (Signed)
  Pateint called stating that pharmacy did not have any refills on file for her called pharmacy was on hold for 40 mins then call was disconnected      Disp Refills Start End    gabapentin (NEURONTIN) 300 MG capsule 90 capsule 2 06/06/2022 09/04/2022   Sig - Route: Take 1 capsule (300 mg total) by mouth 3 (three) times daily. - Oral   Sent to pharmacy as: gabapentin (NEURONTIN) 300 MG capsule   Notes to Pharmacy: Fill after 9/13   E-Prescribing Status: Receipt confirmed by pharmacy (05/01/2022 10:03 AM EDT

## 2022-08-16 NOTE — Telephone Encounter (Signed)
It would be great if you could try calling the pharmacy tomorrow as she should have medication until 12/19 (please ask them how many refills were filled since July- she likely filled medication in August/sooner while it was supposed to be filled in mid Sept).

## 2022-08-20 NOTE — Telephone Encounter (Signed)
Did you have a chance to ask the pharmacy, how may tabs were filled since July as I asked in the previous message. She likely filled medication sooner around that time, and that is why I am not ordering now.

## 2022-08-21 NOTE — Progress Notes (Deleted)
Referring Physician:  Lorn Junes, FNP [address] [place],  [place] [address]  Primary Physician:  Lorn Junes, FNP  DOS 08/06/18  ACDF C3-C7 by Dr. Myer Haff  History of Present Illness: 08/21/2022*** [name] has a history of bipolar disorder, migraines, paranoid schizophrenia, chronic pain syndrome, PTSD, and history of drug use.   She was seen in ED with neck/shoulder pain after a fall on 07/01/22. She felt off balance per ED notes and fell landing on her right side.   Given percocet 5/325 in ED.    If having severe neck pain, can get flex/ext xrays OR do diagnostic bilateral C3-C4 facet injections and tell her to pay attention to how much they help. Would try PT first. 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  The symptoms are causing a significant impact on the patient's life.   Review of Systems:  A 10 point review of systems is negative, except for the pertinent positives and negatives detailed in the HPI.  Past Medical History: Past Medical History:  Diagnosis Date   Allergy    Anxiety    Arthritis    Bipolar 1 disorder (HCC)    Depression    GERD (gastroesophageal reflux disease)    Headache    migraines   Personality disorder (HCC)    Polysubstance abuse (HCC)    Schizophrenia (HCC)     Past Surgical History: Past Surgical History:  Procedure Laterality Date   ANTERIOR CERVICAL DECOMPRESSION/DISCECTOMY FUSION 4 LEVELS N/A 08/06/2018   Procedure:  ANTERIOR CERVICAL DECOMPRESSION/DISCECTOMY FUSION 4 LEVELS-c3-7;  Surgeon: Venetia Night, MD;  Location: [facility] ORS;  Service: Neurosurgery;  Laterality: N/A;   KNEE ARTHROSCOPY WITH PATELLA RECONSTRUCTION     left patella removed   wrists Bilateral     Allergies: Allergies as of 08/23/2022 - Review Complete 08/01/2022  Allergen Reaction Noted   Aspirin Other (See Comments) and Shortness Of Breath 06/09/2014   Flurbiprofen Shortness Of Breath 06/09/2014   Nsaids Shortness Of Breath 12/20/2016    Medications: Outpatient Encounter Medications as of 08/23/2022  Medication Sig   busPIRone (BUSPAR) 15 MG tablet Take 2 tablets (30 mg total) by mouth 2 (two) times daily.   [START ON 08/23/2022] divalproex (DEPAKOTE ER) 250 MG 24 hr tablet Take 1 tablet (250 mg total) by mouth daily. Take total of 1250 mg at night. Take along with 500 mg tabs   divalproex (DEPAKOTE ER) 500 MG 24 hr tablet Take 2 tablets (1,000 mg total) by mouth daily. Total of 1250 mg daily. Take along with 250 mg tab   DULoxetine (CYMBALTA) 60 MG capsule Take 1 capsule (60 mg total) by mouth daily.   gabapentin (NEURONTIN) 300 MG capsule Take 1 capsule (300 mg total) by mouth 3 (three) times daily.   [START ON 08/25/2022] nortriptyline (PAMELOR) 25 MG capsule Take 1 capsule (25 mg total) by mouth at bedtime.   oxyCODONE-acetaminophen (PERCOCET) 5-325 MG tablet Take 1 tablet by mouth every 4 (four) hours as needed for severe pain.   prazosin (MINIPRESS) 2 MG capsule Take 1 capsule (2 mg total)  by mouth at bedtime.   No facility-administered encounter medications on file as of 08/23/2022.    Social History: Social History   Tobacco Use   Smoking status: Every Day    Packs/day: 0.25    Types: Cigarettes   Smokeless tobacco: Never   Tobacco comments:    Nicotine patch was ordered  Vaping Use   Vaping Use: Never used  Substance Use Topics   Alcohol use: No   Drug use: Not Currently    Types: Cocaine, Amphetamines,  Marijuana    Comment: last 1 month ago    Family Medical History: Family History  Problem Relation Age of Onset   Heart disease Mother    Hyperlipidemia Mother    Hypertension Mother    Kidney disease Mother    Arthritis Mother    Stroke Father    Diabetes Father     Physical Examination: There were no vitals filed for this visit.  General: Patient is well developed, well nourished, calm, collected, and in no apparent distress. Attention to examination is appropriate.  Respiratory: Patient is breathing without any difficulty.   NEUROLOGICAL:     Awake, alert, oriented to person, place, and time.  Speech is clear and fluent. Fund of knowledge is appropriate.   Cranial Nerves: Pupils equal round and reactive to light.  Facial tone is symmetric.  Facial sensation is symmetric.  ROM of spine:  *** ROM of cervical spine *** pain *** ROM of lumbar spine *** pain  No abnormal lesions on exposed skin.   Strength: Side Biceps Triceps Deltoid Interossei Grip Wrist Ext. Wrist Flex.  R 5 5 5 5 5 5 5   L 5 5 5 5 5 5 5    Side Iliopsoas Quads Hamstring PF DF EHL  R 5 5 5 5 5 5   L 5 5 5 5 5 5    Reflexes are ***2+ and symmetric at the biceps, triceps, brachioradialis, patella and achilles.   Hoffman's is absent.  Clonus is not present.   Bilateral upper and lower extremity sensation is intact to light touch.    No evidence of dysmetria noted.  Gait is normal.   ***No difficulty with tandem gait.    Medical Decision Making  Imaging: CT cervical spine dated 07/01/22:  FINDINGS: CT HEAD FINDINGS   Brain: There is no acute intracranial hemorrhage, extra-axial fluid collection, or acute infarct.   Parenchymal volume is normal. The ventricles are normal in size. Gray-white differentiation is preserved.   There is no mass lesion.  There is no mass effect or midline shift.   Vascular: No hyperdense vessel or unexpected calcification.   Skull: Normal. Negative for fracture  or focal lesion.   Sinuses/Orbits: No acute finding. The imaged paranasal sinuses are clear. The globes and orbits are unremarkable.   Other: None.   CT CERVICAL SPINE FINDINGS   Alignment: There is straightening of the normal cervical lordosis. There is no antero or retrolisthesis. There is no jumped or perched facet or other evidence of traumatic malalignment.   Skull base and vertebrae: Skull base alignment is maintained. Vertebral body heights are preserved. There is no evidence of acute fracture. There is no suspicious osseous lesion. Postsurgical changes reflecting C3 through C7 ACDF are again seen. There is lucency around the C3 screws to a lesser and degree in the right C4 screw, similar to 2021. There is solid fusion across the C4-C5 and C5-C6 disc spaces but no evidence of fusion at C3-C4 or C6-C7. There is  no other perihardware lucency. Findings are overall unchanged since 2021.   Soft tissues and spinal canal: No prevertebral fluid or swelling. No visible canal hematoma.   Disc levels: There is degenerative endplate spurring at C3-C4 and to a lesser degree at C6-C7. Facet arthropathy is most advanced on the right at C2-C3 and C3-C4. There is no evidence of high-grade spinal canal stenosis.   Upper chest: Imaged lung apices are clear.   Other: None.   IMPRESSION: 1. No acute intracranial pathology. 2. No acute fracture or traumatic malalignment of the cervical spine. 3. Postsurgical changes reflecting C3 through C7 ACDF with unchanged lucency around the C3 screws and right C4 screw which may reflect loosening.     Electronically Signed   By: Lesia Hausen M.D.   On: 07/01/2022 18:38    I have personally reviewed the images and agree with the above interpretation.  Assessment and Plan: Mercedes Forbes is a pleasant 51 y.o. female with ***  Treatment options discussed with patient and following plan made:   - Order for physical therapy for *** spine ***. -  Continue on current medications including ***. Reviewed proper dosing along with risks and benefits. Take and NSAIDs with food.      I spent a total of *** minutes in face-to-face and non-face-to-face activities related to this patient's care toda including review of outside records, review of imaging, review of symptoms, physical exam, discussion of differential diagnosis, discussion of treatment options, and documentation.   Thank you for involving me in the care of this patient.   Drake Leach PA-C Dept. of Neurosurgery

## 2022-08-23 ENCOUNTER — Ambulatory Visit: Payer: Medicare Other | Admitting: Orthopedic Surgery

## 2022-08-30 ENCOUNTER — Other Ambulatory Visit: Payer: Self-pay | Admitting: Psychiatry

## 2022-08-30 ENCOUNTER — Telehealth: Payer: Self-pay | Admitting: Psychiatry

## 2022-08-30 MED ORDER — GABAPENTIN 300 MG PO CAPS: 300.0000 mg | ORAL_CAPSULE | Freq: Three times a day (TID) | ORAL | 2 refills | Status: DC

## 2022-08-30 NOTE — Telephone Encounter (Signed)
Patient called stating she had left message and spoke to someone that said her prescription was sent. She cannot recall who. States that [facility] [address] still does not have a prescription for gabapentin. Please advise

## 2022-08-30 NOTE — Telephone Encounter (Signed)
Ordered

## 2022-09-03 ENCOUNTER — Telehealth: Payer: Self-pay

## 2022-09-03 NOTE — Telephone Encounter (Signed)
Spoke to [employer] [facility] he stated that the patient has picked up 450 tablets of the Gabapentin  since July.

## 2022-09-03 NOTE — Telephone Encounter (Signed)
She has a refill to pick it up today, fyi
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
## 2022-09-05 ENCOUNTER — Encounter: Payer: Self-pay | Admitting: Psychiatry

## 2022-09-05 ENCOUNTER — Telehealth (INDEPENDENT_AMBULATORY_CARE_PROVIDER_SITE_OTHER): Payer: Medicare Other | Admitting: Psychiatry

## 2022-09-05 DIAGNOSIS — F4310 Post-traumatic stress disorder, unspecified: Secondary | ICD-10-CM

## 2022-09-05 DIAGNOSIS — F3181 Bipolar II disorder: Secondary | ICD-10-CM | POA: Diagnosis not present

## 2022-09-05 DIAGNOSIS — F411 Generalized anxiety disorder: Secondary | ICD-10-CM | POA: Diagnosis not present

## 2022-09-05 MED ORDER — DIVALPROEX SODIUM ER 250 MG PO TB24: 250.0000 mg | ORAL_TABLET | Freq: Every day | ORAL | 0 refills | Status: DC

## 2022-09-05 MED ORDER — DULOXETINE HCL 60 MG PO CPEP: 60.0000 mg | ORAL_CAPSULE | Freq: Every day | ORAL | 0 refills | Status: DC

## 2022-09-05 NOTE — Patient Instructions (Signed)
Continue duloxetine 60 mg daily - refill left Continue Depakote 1250 mg at night  Continue buspirone 15 mg twice day Continue gabapentin 300 mg three times a day Continue nortriptyline 25 mg at night  Continue prazosin 2 mg at night  Next appointment: 1/17 at 3 PM

## 2022-09-25 ENCOUNTER — Telehealth: Payer: Self-pay

## 2022-09-25 NOTE — Telephone Encounter (Signed)
Lm for patient to ask if she has had previous sleep study.  

## 2022-09-26 ENCOUNTER — Institutional Professional Consult (permissible substitution): Payer: Medicare Other | Admitting: Primary Care

## 2022-09-26 NOTE — Telephone Encounter (Signed)
Spoke to patient. She stated that she has not had previous sleep study. She has requested to cancel appt for today due to vomiting and fever. She will call back to reschedule.  Nothing further needed.

## 2022-09-27 ENCOUNTER — Other Ambulatory Visit: Payer: Self-pay | Admitting: Psychiatry
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
## 2022-10-03 ENCOUNTER — Encounter: Payer: Self-pay | Admitting: Psychiatry

## 2022-10-03 ENCOUNTER — Telehealth (INDEPENDENT_AMBULATORY_CARE_PROVIDER_SITE_OTHER): Payer: Medicare Other | Admitting: Psychiatry

## 2022-10-03 DIAGNOSIS — F411 Generalized anxiety disorder: Secondary | ICD-10-CM | POA: Diagnosis not present

## 2022-10-03 DIAGNOSIS — F4310 Post-traumatic stress disorder, unspecified: Secondary | ICD-10-CM | POA: Diagnosis not present

## 2022-10-03 DIAGNOSIS — F3181 Bipolar II disorder: Secondary | ICD-10-CM | POA: Diagnosis not present

## 2022-10-03 DIAGNOSIS — G4700 Insomnia, unspecified: Secondary | ICD-10-CM

## 2022-10-03 MED ORDER — BUSPIRONE HCL 15 MG PO TABS: 30.0000 mg | ORAL_TABLET | Freq: Two times a day (BID) | ORAL | 0 refills | Status: AC

## 2022-10-03 MED ORDER — PRAZOSIN HCL 2 MG PO CAPS: 2.0000 mg | ORAL_CAPSULE | Freq: Every day | ORAL | 0 refills | Status: DC

## 2022-10-03 NOTE — Addendum Note (Signed)
Addended by: Norman Clay on: 10/03/2022 03:36 PM   Modules accepted: Orders

## 2022-10-10 ENCOUNTER — Emergency Department: Payer: Medicare Other

## 2022-10-10 ENCOUNTER — Emergency Department
Admission: EM | Admit: 2022-10-10 | Discharge: 2022-10-10 | Disposition: A | Discharge status: Home / Self Care | Payer: Medicare Other | Attending: Emergency Medicine | Admitting: Emergency Medicine

## 2022-10-10 ENCOUNTER — Other Ambulatory Visit: Payer: Self-pay

## 2022-10-10 DIAGNOSIS — S199XXA Unspecified injury of neck, initial encounter: Secondary | ICD-10-CM | POA: Diagnosis present

## 2022-10-10 DIAGNOSIS — Y93K1 Activity, walking an animal: Secondary | ICD-10-CM | POA: Diagnosis not present

## 2022-10-10 DIAGNOSIS — Y92009 Unspecified place in unspecified non-institutional (private) residence as the place of occurrence of the external cause: Secondary | ICD-10-CM | POA: Insufficient documentation

## 2022-10-10 DIAGNOSIS — X58XXXA Exposure to other specified factors, initial encounter: Secondary | ICD-10-CM | POA: Diagnosis not present

## 2022-10-10 LAB — BASIC METABOLIC PANEL
Anion gap: 10 (ref 5–15)
BUN: 17 mg/dL (ref 6–20)
CO2: 22 mmol/L (ref 22–32)
Calcium: 8.9 mg/dL (ref 8.9–10.3)
Chloride: 105 mmol/L (ref 98–111)
Creatinine, Ser: 0.93 mg/dL (ref 0.44–1.00)
GFR, Estimated: >60 mL/min (ref >60)
Glucose, Bld: 86 mg/dL (ref 70–99)
Potassium: 3.8 mmol/L (ref 3.5–5.1)
Sodium: 137 mmol/L (ref 135–145)

## 2022-10-10 LAB — CBC
HCT: 35 % — ABNORMAL LOW (ref 36.0–46.0)
Hemoglobin: 11.3 g/dL — ABNORMAL LOW (ref 12.0–15.0)
MCH: 30.5 pg (ref 26.0–34.0)
MCHC: 32.3 g/dL (ref 30.0–36.0)
MCV: 94.6 fL (ref 80.0–100.0)
Platelets: 212 10*3/uL (ref 150–400)
RBC: 3.7 MIL/uL — ABNORMAL LOW (ref 3.87–5.11)
RDW: 15.4 % (ref 11.5–15.5)
WBC: 6.3 10*3/uL (ref 4.0–10.5)
nRBC: 0 % (ref 0.0–0.2)

## 2022-10-10 MED ORDER — TRAMADOL HCL 50 MG PO TABS
50.0000 mg | ORAL_TABLET | Freq: Once | ORAL | Status: AC
  Administered 2022-10-10: 50 mg via ORAL
  Filled 2022-10-10: qty 1

## 2022-10-10 MED ORDER — TRAMADOL HCL 50 MG PO TABS: 50.0000 mg | ORAL_TABLET | Freq: Four times a day (QID) | ORAL | 0 refills | Status: DC | PRN

## 2022-10-10 NOTE — ED Notes (Signed)
C collar placed in triage

## 2022-10-10 NOTE — ED Provider Notes (Signed)
[facility] Provider Note    Event Date/Time   First MD Initiated Contact with Patient 10/10/22 2221     (approximate)   History   Neck Injury   HPI  Mercedes Forbes is a 52 y.o. female with a history of schizophrenia, bipolar disorder, biceps abuse who presents with complaints of neck pain.  Patient reports that she was walking her dog with her chronically weak right arm, it pulled her and her neck went 1 way and her arm went the other way.  She complains of pain in her neck.  She reports she has been seeing Dr. Cari Caraway in the past for her neck pain     Physical Exam   Triage Vital Signs: ED Triage Vitals  Enc Vitals Group     BP 10/10/22 2129 128/81     Pulse Rate 10/10/22 2129 77     Resp 10/10/22 2129 (!) 22     Temp 10/10/22 2131 98.7 F (37.1 C)     Temp Source 10/10/22 2131 Oral     SpO2 10/10/22 2129 97 %     Weight 10/10/22 2131 68 kg (150 lb)     Height 10/10/22 2131 1.702 m (5\' 7" )     Head Circumference --      Peak Flow --      Pain Score 10/10/22 2129 9     Pain Loc --      Pain Edu? --      Excl. in [place]? --     Most recent vital signs: Vitals:   10/10/22 2131 10/10/22 2255  BP:  116/80  Pulse:  64  Resp:  17  Temp: 98.7 F (37.1 C)   SpO2:  98%     General: Awake, no distress.  CV:  Good peripheral perfusion.  Resp:  Normal effort.  Abd:  No distention.  Other:  Right arm: Good grip strength, able to lift against gravity, she reports this is essentially her baseline   ED Results / Procedures / Treatments   Labs (all labs ordered are listed, but only abnormal results are displayed) Labs Reviewed  CBC - Abnormal; Notable for the following components:      Result Value   RBC 3.70 (*)    Hemoglobin 11.3 (*)    HCT 35.0 (*)    All other components within normal limits  BASIC METABOLIC PANEL     EKG     RADIOLOGY CT cervical spine viewed interpreted by me, no  fracture    PROCEDURES:  Critical Care performed:   Procedures   MEDICATIONS ORDERED IN ED: Medications  traMADol (ULTRAM) tablet 50 mg (50 mg Oral Given 10/10/22 2252)     IMPRESSION / MDM / [place] / ED COURSE  I reviewed the triage vital signs and the nursing notes. Patient's presentation is most consistent with acute complicated illness / injury requiring diagnostic workup.  Patient presents with neck pain, right arm tingling as above.  She has a history of cervical fusion in the past and reports her right arm is typically weaker than her left arm, no significant change in exam.  CT cervical spine is reassuring, lab work is unremarkable.  Recommend supportive care, analgesics provided, follow-up with neurosurgery as needed she is already established with them.        FINAL CLINICAL IMPRESSION(S) / ED DIAGNOSES   Final diagnoses:  Neck injury, initial encounter     Rx / DC Orders  ED Discharge Orders          Ordered    traMADol (ULTRAM) 50 MG tablet  Every 6 hours PRN        10/10/22 2246             Note:  This document was prepared using Dragon voice recognition software and may include unintentional dictation errors.   Lavonia Drafts, MD 10/10/22 2320

## 2022-10-10 NOTE — ED Triage Notes (Signed)
Pt comes from home via pov c/o neck pain and right arm numbness. Pts dog jerked pts right arm all way back and neck was in weird position. Pt having neck pain, unable to hold head up and right arm is tingly and numb. Pt in NAD at this time.

## 2022-10-18 NOTE — Progress Notes (Deleted)
Referring Physician:  Lavonia Drafts, MD [address] [place],  [place] [address]  Primary Physician:  Bunnie Pion, FNP  History of Present Illness: 10/18/2022*** Mercedes Forbes has a history of bipolar, migraines, schizophrenia, hypothyroidism, and history of cocaine abuse.  She is s/p ACDF C3-C7 by Dr. Izora Ribas on 08/06/18.   Was seen in ED on 10/10/22 with increased neck pain with tingling in right arm after walking her dog- he went one way and pulled her arm.    Given ultram from ED.    ***luncency at C3 screws and righ tC4 screw??***  Duration: *** Location: *** Quality: *** Severity: ***  Precipitating: aggravated by *** Modifying factors: made better by *** Weakness: none Timing: *** Bowel/Bladder Dysfunction: none  Conservative measures:  Physical therapy: ***  Multimodal medical therapy including regular antiinflammatories: ***  Injections: *** epidural steroid injections  Past Surgery: ACDF C3-C7 by Dr. Izora Ribas on 08/06/18  Mercedes Forbes has ***no symptoms of cervical myelopathy.  The symptoms are causing a significant impact on the patient's life.   Review of Systems:  A 10 point review of systems is negative, except for the pertinent positives and negatives detailed in the HPI.  Past Medical History: Past Medical History:  Diagnosis Date   Allergy    Anxiety    Arthritis    Bipolar 1 disorder ([place])    Depression    GERD (gastroesophageal reflux disease)    Headache    migraines   Personality disorder ([place])    Polysubstance abuse ([place])    Schizophrenia ([place])     Past Surgical History: Past Surgical History:  Procedure Laterality Date   ANTERIOR CERVICAL DECOMPRESSION/DISCECTOMY FUSION 4 LEVELS N/A 08/06/2018   Procedure: ANTERIOR CERVICAL DECOMPRESSION/DISCECTOMY FUSION 4 LEVELS-c3-7;  Surgeon: Meade Maw, MD;  Location: [facility] ORS;  Service: Neurosurgery;  Laterality: N/A;   KNEE ARTHROSCOPY WITH PATELLA  RECONSTRUCTION     left patella removed   wrists Bilateral     Allergies: Allergies as of 10/23/2022 - Review Complete 10/10/2022  Allergen Reaction Noted   Aspirin Other (See Comments) and Shortness Of Breath 06/09/2014   Flurbiprofen Shortness Of Breath 06/09/2014   Nsaids Shortness Of Breath 12/20/2016    Medications: Outpatient Encounter Medications as of 10/23/2022  Medication Sig   busPIRone (BUSPAR) 15 MG tablet Take 2 tablets (30 mg total) by mouth 2 (two) times daily.   divalproex (DEPAKOTE ER) 250 MG 24 hr tablet Take 1 tablet (250 mg total) by mouth daily. Take total of 1250 mg at night. Take along with 500 mg tabs   divalproex (DEPAKOTE ER) 500 MG 24 hr tablet Take 2 tablets (1,000 mg total) by mouth daily. Total of 1250 mg daily. Take along with 250 mg tab   DULoxetine (CYMBALTA) 60 MG capsule Take 1 capsule (60 mg total) by mouth daily.   gabapentin (NEURONTIN) 300 MG capsule Take 1 capsule (300 mg total) by mouth 3 (three) times daily.   nortriptyline (PAMELOR) 25 MG capsule Take 1 capsule (25 mg total) by mouth at bedtime.   prazosin (MINIPRESS) 2 MG capsule Take 1 capsule (2 mg total) by mouth at bedtime.   traMADol (ULTRAM) 50 MG tablet Take 1 tablet (50 mg total) by mouth every 6 (six) hours as needed.   No facility-administered encounter medications on file as of 10/23/2022.    Social History: Social History   Tobacco Use   Smoking status: Every Day    Packs/day: 0.25  Types: Cigarettes   Smokeless tobacco: Never   Tobacco comments:    Nicotine patch was ordered  Vaping Use   Vaping Use: Never used  Substance Use Topics   Alcohol use: No   Drug use: Not Currently    Types: Cocaine, Amphetamines, Marijuana    Comment: last 1 month ago    Family Medical History: Family History  Problem Relation Age of Onset   Heart disease Mother    Hyperlipidemia Mother    Hypertension Mother    Kidney disease Mother    Arthritis Mother    Stroke Father     Diabetes Father     Physical Examination: There were no vitals filed for this visit.  General: Patient is well developed, well nourished, calm, collected, and in no apparent distress. Attention to examination is appropriate.  Respiratory: Patient is breathing without any difficulty.   NEUROLOGICAL:     Awake, alert, oriented to person, place, and time.  Speech is clear and fluent. Fund of knowledge is appropriate.   Cranial Nerves: Pupils equal round and reactive to light.  Facial tone is symmetric.    *** ROM of cervical spine *** pain *** posterior cervical tenderness. *** tenderness in bilateral trapezial region.   *** ROM of lumbar spine *** pain *** posterior lumbar tenderness.   No abnormal lesions on exposed skin.   Strength: Side Biceps Triceps Deltoid Interossei Grip Wrist Ext. Wrist Flex.  R '5 5 5 5 5 5 5  '$ L '5 5 5 5 5 5 5   '$ Side Iliopsoas Quads Hamstring PF DF EHL  R '5 5 5 5 5 5  '$ L '5 5 5 5 5 5   '$ Reflexes are ***2+ and symmetric at the biceps, triceps, brachioradialis, patella and achilles.   Hoffman's is absent.  Clonus is not present.   Bilateral upper and lower extremity sensation is intact to light touch.     Gait is normal.   ***No difficulty with tandem gait.    Medical Decision Making  Imaging: CT of cervical spine dated 10/10/22:  FINDINGS: Alignment: Normal.   Skull base and vertebrae: There is no evidence for acute fracture. Anterior fusion plate and disc spacers are seen from C3 through C7 there is no evidence for hardware loosening or acute fracture. No focal osseous lesions are seen.   Soft tissues and spinal canal: No prevertebral fluid or swelling. No visible canal hematoma.   Disc levels: There is disc space narrowing at C7-T1, T1-T2 and T2-T3 with endplate osteophyte formation compatible with degenerative change. Scattered facet arthropathy is present bilaterally. There is bilateral neural foraminal stenosis at C3-C4, and multilevel  mild central canal stenosis throughout the surgical levels which appears similar to the prior study. There has been no significant interval change.   Upper chest: Negative.   Other: None.   IMPRESSION: 1. No acute fracture or traumatic subluxation. 2. Stable postsurgical changes from C3 through C7. 3. Stable degenerative changes.     Electronically Signed   By: Ronney Asters M.D.   On: 10/10/2022 22:33    I have personally reviewed the images and agree with the above interpretation.  Assessment and Plan: Mercedes Forbes is a pleasant 52 y.o. female has ***  Treatment options discussed with patient and following plan made:   - Order for physical therapy for *** spine ***. Patient to call to schedule appointment. *** - Continue current medications including ***. Reviewed dosing and side effects.  - Prescription for ***. Reviewed  dosing and side effects. Take with food.  - Prescription for *** to take prn muscle spasms. Reviewed dosing and side effects. Discussed this can cause drowsiness.  - MRI of *** to further evaluate *** radiculopathy. No improvement time or medications (***).  - Referral to PMR at [facility] to discuss possible *** injections.  - Will schedule phone visit to review MRI results once I get them back.   I spent a total of *** minutes in face-to-face and non-face-to-face activities related to this patient's care today including review of outside records, review of imaging, review of symptoms, physical exam, discussion of differential diagnosis, discussion of treatment options, and documentation.   Thank you for involving me in the care of this patient.   Geronimo Boot PA-C Dept. of Neurosurgery

## 2022-10-23 ENCOUNTER — Ambulatory Visit: Payer: Medicare Other | Admitting: Orthopedic Surgery

## 2022-10-30 NOTE — Progress Notes (Signed)
Referring Physician:  Lavonia Drafts, MD [address] [place],  [place] [address]  Primary Physician:  Bunnie Pion, FNP  History of Present Illness: 11/02/2022 [name] has a history of bipolar, migraines, schizophrenia, hypothyroidism, and history of cocaine abuse.  She is s/p ACDF C3-C7 by Dr. Izora Ribas on 08/06/18. She had initial improvement, but then pain returned in right arm.   She then had some improvement in pain for about a year, but then pain got worse after fall in October of 2023. She was also seen in ED on 10/10/22 with increased neck pain with tingling in right arm after walking her dog- he went one way and pulled her arm.   She has constant right sided neck pain that radiates down arm to right hand. She has intermittent numbness/tingling in right hand. She notes weakness in her right hand- this is not new. No left arm pain or symptoms. No specific aggravating factors.   Given ultram from ED.   Stopped smoking in August 2022.   Bowel/Bladder Dysfunction: none  Conservative measures:  Physical therapy: nothing recent  Multimodal medical therapy including regular antiinflammatories: neurontin  Injections: No recent epidural steroid injections  Past Surgery: ACDF C3-C7 by Dr. Izora Ribas on 08/06/18  [name] has  no symptoms of cervical myelopathy.  The symptoms are causing a significant impact on the patient's life.   Review of Systems:  A 10 point review of systems is negative, except for the pertinent positives and negatives detailed in the HPI.  Past Medical History: Past Medical History:  Diagnosis Date   Allergy    Anxiety    Arthritis    Bipolar 1 disorder ([place])    Depression    GERD (gastroesophageal reflux disease)    Headache    migraines   Personality disorder ([place])    Polysubstance abuse ([place])    Schizophrenia ([place])     Past Surgical History: Past Surgical History:  Procedure Laterality Date   ANTERIOR  CERVICAL DECOMPRESSION/DISCECTOMY FUSION 4 LEVELS N/A 08/06/2018   Procedure: ANTERIOR CERVICAL DECOMPRESSION/DISCECTOMY FUSION 4 LEVELS-c3-7;  Surgeon: Meade Maw, MD;  Location: [facility] ORS;  Service: Neurosurgery;  Laterality: N/A;   KNEE ARTHROSCOPY WITH PATELLA RECONSTRUCTION     left patella removed   wrists Bilateral     Allergies: Allergies as of 11/02/2022 - Review Complete 11/02/2022  Allergen Reaction Noted   Aspirin Other (See Comments) and Shortness Of Breath 06/09/2014   Flurbiprofen Shortness Of Breath 06/09/2014   Nsaids Shortness Of Breath 12/20/2016   Tramadol Itching 10/12/2022    Medications: Outpatient Encounter Medications as of 11/02/2022  Medication Sig   busPIRone (BUSPAR) 15 MG tablet Take 2 tablets (30 mg total) by mouth 2 (two) times daily.   divalproex (DEPAKOTE ER) 500 MG 24 hr tablet Take 2 tablets (1,000 mg total) by mouth daily. Total of 1250 mg daily. Take along with 250 mg tab (Patient taking differently: Take 1,500 mg by mouth daily. Total of 1500 mg daily.  3 500 MG tablets)   DULoxetine (CYMBALTA) 60 MG capsule Take 1 capsule (60 mg total) by mouth daily.   gabapentin (NEURONTIN) 300 MG capsule Take 1 capsule (300 mg total) by mouth 3 (three) times daily.   prazosin (MINIPRESS) 2 MG capsule Take 1 capsule (2 mg total) by mouth at bedtime.   [DISCONTINUED] divalproex (DEPAKOTE ER) 250 MG 24 hr tablet Take 1 tablet (250 mg total) by mouth daily. Take total of 1250 mg at night.  Take along with 500 mg tabs   [DISCONTINUED] nortriptyline (PAMELOR) 25 MG capsule Take 1 capsule (25 mg total) by mouth at bedtime.   [DISCONTINUED] traMADol (ULTRAM) 50 MG tablet Take 1 tablet (50 mg total) by mouth every 6 (six) hours as needed.   No facility-administered encounter medications on file as of 11/02/2022.    Social History: Social History   Tobacco Use   Smoking status: Former    Packs/day: 0.25    Types: Cigarettes    Start date: 05/01/2021    Smokeless tobacco: Never   Tobacco comments:    No e cis, vape, chew  Vaping Use   Vaping Use: Never used  Substance Use Topics   Alcohol use: No   Drug use: Not Currently    Types: Cocaine, Amphetamines, Marijuana    Comment: last 1 month ago    Family Medical History: Family History  Problem Relation Age of Onset   Heart disease Mother    Hyperlipidemia Mother    Hypertension Mother    Kidney disease Mother    Arthritis Mother    Stroke Father    Diabetes Father     Physical Examination: Vitals:   11/02/22 1103  BP: 110/68    General: Patient is well developed, well nourished, calm, collected, and in no apparent distress. Attention to examination is appropriate.  Respiratory: Patient is breathing without any difficulty.   NEUROLOGICAL:     Awake, alert, oriented to person, place, and time.  Speech is clear and fluent. Fund of knowledge is appropriate.   Cranial Nerves: Pupils equal round and reactive to light.  Facial tone is symmetric.    Mild lower posterior cervical tenderness. positive tenderness in right trapezial region.   No abnormal lesions on exposed skin.   Strength: Side Biceps Triceps Deltoid Interossei Grip Wrist Ext. Wrist Flex.  R 5 4+ 5 4+ 5 4+ 5  L 5 5 5 5 5 5 5   $ Side Iliopsoas Quads Hamstring PF DF EHL  R 5 5 5 5 5 5  $ L 5 5 5 5 5 5   $ Reflexes are 2+ and symmetric at the biceps, triceps, brachioradialis, achilles.   Hoffman's is absent.  Clonus is not present.   Bilateral upper and lower extremity sensation is intact to light touch.     She has a slight limp due to left knee pain.    Medical Decision Making  Imaging: CT of cervical spine dated 10/10/22:  FINDINGS: Alignment: Normal.   Skull base and vertebrae: There is no evidence for acute fracture. Anterior fusion plate and disc spacers are seen from C3 through C7 there is no evidence for hardware loosening or acute fracture. No focal osseous lesions are seen.   Soft  tissues and spinal canal: No prevertebral fluid or swelling. No visible canal hematoma.   Disc levels: There is disc space narrowing at C7-T1, T1-T2 and T2-T3 with endplate osteophyte formation compatible with degenerative change. Scattered facet arthropathy is present bilaterally. There is bilateral neural foraminal stenosis at C3-C4, and multilevel mild central canal stenosis throughout the surgical levels which appears similar to the prior study. There has been no significant interval change.   Upper chest: Negative.   Other: None.   IMPRESSION: 1. No acute fracture or traumatic subluxation. 2. Stable postsurgical changes from C3 through C7. 3. Stable degenerative changes.     Electronically Signed   By: Ronney Asters M.D.   On: 10/10/2022 22:33    I have  personally reviewed the images and agree with the above interpretation. She does not appear to be fused at C3-C4 or C6-C7 which was seen on previous imaging.   Assessment and Plan: Mercedes Forbes is a pleasant 52 y.o. female is s/p ACDF C3-C7 by Dr. Izora Ribas on 08/06/18. She had initial improvement, but then pain returned in right arm.   She then had some improvement in pain for about a year, but then pain got worse after fall in October of 2023.   She has constant right sided neck pain that radiates down arm to right hand. She has intermittent numbness/tingling in right hand. She notes weakness in her right hand- this is not new.   In review of above CT, she does not appear to be fused at C3-C4 and C6-C7. This was seen on previous imaging.   Treatment options discussed with patient and following plan made:   - Will need to review with Dr. Izora Ribas and call her with further plan.  - In the interim, she can increase her neurontin. She did okay with 638m tid in the past. She is currently on 3052mtid. She will take 30049mid and increase to 600m82m night. Will call if she needs a refill.  - Of note, she quit smoking in  Auguse of 2022.   I spent a total of 25 minutes in face-to-face and non-face-to-face activities related to this patient's care today including review of outside records, review of imaging, review of symptoms, physical exam, discussion of differential diagnosis, discussion of treatment options, and documentation.   Thank you for involving me in the care of this patient.   StacGeronimo BootC Dept. of Neurosurgery

## 2022-11-02 ENCOUNTER — Encounter: Payer: Self-pay | Admitting: Orthopedic Surgery

## 2022-11-02 ENCOUNTER — Ambulatory Visit: Payer: Medicare Other | Admitting: Orthopedic Surgery

## 2022-11-02 ENCOUNTER — Other Ambulatory Visit
Admission: RE | Admit: 2022-11-02 | Discharge: 2022-11-02 | Disposition: A | Discharge status: Home / Self Care | Payer: Medicare Other | Source: Ambulatory Visit | Attending: Psychiatry | Admitting: Psychiatry

## 2022-11-02 VITALS — BP 110/68 | Ht 67.0 in | Wt 154.0 lb

## 2022-11-02 DIAGNOSIS — Z981 Arthrodesis status: Secondary | ICD-10-CM | POA: Diagnosis not present

## 2022-11-02 DIAGNOSIS — M5412 Radiculopathy, cervical region: Secondary | ICD-10-CM

## 2022-11-02 DIAGNOSIS — F3181 Bipolar II disorder: Secondary | ICD-10-CM | POA: Insufficient documentation

## 2022-11-02 LAB — HEPATIC FUNCTION PANEL
ALT: 13 U/L (ref 0–44)
AST: 22 U/L (ref 15–41)
Albumin: 3.8 g/dL (ref 3.5–5.0)
Alkaline Phosphatase: 64 U/L (ref 38–126)
Bilirubin, Direct: <0.1 mg/dL (ref 0.0–0.2)
Total Bilirubin: 0.6 mg/dL (ref 0.3–1.2)
Total Protein: 7.5 g/dL (ref 6.5–8.1)

## 2022-11-02 LAB — VALPROIC ACID LEVEL: Valproic Acid Lvl: 67 ug/mL (ref 50.0–100.0)

## 2022-11-02 NOTE — Patient Instructions (Signed)
It was so nice to see you today. Thank you so much for coming in.    In review of your imaging, I don't think your fusion has healed the top or the bottom.   I want to review this with Dr. Izora Ribas and we will call you with a further plan. You should hear from [place] by early next week.   You can increase your gabapentin- you can take 343m in the morning, 3073mat lunch, and 60016mt night.   Please do not hesitate to call if you have any questions or concerns. You can also message me in MyCPower StaGeronimo Boot-C [phone]

## 2022-11-03 ENCOUNTER — Encounter: Payer: Self-pay | Admitting: Psychiatry
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
## 2022-11-05 ENCOUNTER — Telehealth: Payer: Self-pay

## 2022-11-05 DIAGNOSIS — Z981 Arthrodesis status: Secondary | ICD-10-CM

## 2022-11-05 DIAGNOSIS — M5412 Radiculopathy, cervical region: Secondary | ICD-10-CM

## 2022-11-05 NOTE — Telephone Encounter (Signed)
-----   Message from Mercedes Forbes, [place] sent at 11/05/2022 10:06 AM EST ----- Please call and let her know that I reviewed everything with Dr. Izora Ribas. She will need to try conservative treatment/PT prior to considering surgery. Let me know where she wants to go and I can put in orders.   She needs to see Dr. Izora Ribas after PT, maybe in 6-8 weeks?   Thanks!

## 2022-11-05 NOTE — Telephone Encounter (Signed)
I spoke with the patient about the recommendations and the reasoning behind it. I scheduled her with Dr Izora Ribas in 8 weeks and advised her to call us if she is discharged sooner. I provided her with the number to schedule PT.   She has increased her gabapentin and is taking 1 in the morning, 1 in the afternoon, and 2 at night. She would like to know if she can increase to 2 in the morning, 1 in the afternoon, and 2 at night? She is concerned that PT will increase her pain.

## 2022-11-06 DIAGNOSIS — Z981 Arthrodesis status: Secondary | ICD-10-CM

## 2022-11-06 DIAGNOSIS — M5412 Radiculopathy, cervical region: Secondary | ICD-10-CM

## 2022-11-06 NOTE — Telephone Encounter (Signed)
Okay to increase neurontin. Sent her a message.

## 2022-11-06 NOTE — Telephone Encounter (Signed)
She also sent a mychart message this morning

## 2022-11-07 ENCOUNTER — Encounter: Payer: Self-pay | Admitting: Psychiatry

## 2022-11-07 ENCOUNTER — Telehealth (INDEPENDENT_AMBULATORY_CARE_PROVIDER_SITE_OTHER): Payer: Medicare Other | Admitting: Psychiatry

## 2022-11-07 DIAGNOSIS — F411 Generalized anxiety disorder: Secondary | ICD-10-CM

## 2022-11-07 DIAGNOSIS — F4310 Post-traumatic stress disorder, unspecified: Secondary | ICD-10-CM

## 2022-11-07 DIAGNOSIS — G4700 Insomnia, unspecified: Secondary | ICD-10-CM

## 2022-11-07 DIAGNOSIS — F3181 Bipolar II disorder: Secondary | ICD-10-CM

## 2022-11-07 MED ORDER — DIVALPROEX SODIUM ER 500 MG PO TB24: 1500.0000 mg | ORAL_TABLET | Freq: Every day | ORAL | 0 refills | Status: DC

## 2022-11-09 MED ORDER — GABAPENTIN 600 MG PO TABS: 600.0000 mg | ORAL_TABLET | Freq: Three times a day (TID) | ORAL | 1 refills | Status: DC

## 2022-11-09 NOTE — Addendum Note (Signed)
Addended byGeronimo Boot on: 11/09/2022 12:32 PM   Modules accepted: Orders

## 2022-11-21 ENCOUNTER — Ambulatory Visit: Payer: Medicare Other

## 2022-11-26 ENCOUNTER — Ambulatory Visit: Payer: Medicare Other | Attending: Orthopedic Surgery

## 2022-11-26 DIAGNOSIS — Z981 Arthrodesis status: Secondary | ICD-10-CM | POA: Insufficient documentation

## 2022-11-26 DIAGNOSIS — M542 Cervicalgia: Secondary | ICD-10-CM | POA: Diagnosis present

## 2022-11-26 DIAGNOSIS — M5412 Radiculopathy, cervical region: Secondary | ICD-10-CM | POA: Diagnosis present

## 2022-11-26 NOTE — Therapy (Signed)
[facility] [address] [place], [address] Phone: [phone]   Fax:  [phone]  Physical Therapy Evaluation  Patient Details  Name: Mercedes Forbes MRN: [id number] Date of Birth: 02-11-1971 Referring Provider (PT): Geronimo Boot, [place]   Encounter Date: 11/26/2022   PT End of Session - 11/26/22 1418     Visit Number 1    Number of Visits 17    Date for PT Re-Evaluation 01/24/23    PT Start Time 1418    PT Stop Time 1527    PT Time Calculation (min) 69 min    Activity Tolerance Patient tolerated treatment well    Behavior During Therapy [facility] for tasks assessed/performed             Past Medical History:  Diagnosis Date   Allergy    Anxiety    Arthritis    Bipolar 1 disorder ([place])    Depression    GERD (gastroesophageal reflux disease)    Headache    migraines   Personality disorder ([place])    Polysubstance abuse ([place])    Schizophrenia ([place])     Past Surgical History:  Procedure Laterality Date   ANTERIOR CERVICAL DECOMPRESSION/DISCECTOMY FUSION 4 LEVELS N/A 08/06/2018   Procedure: ANTERIOR CERVICAL DECOMPRESSION/DISCECTOMY FUSION 4 LEVELS-c3-7;  Surgeon: Meade Maw, MD;  Location: [facility] ORS;  Service: Neurosurgery;  Laterality: N/A;   KNEE ARTHROSCOPY WITH PATELLA RECONSTRUCTION     left patella removed   wrists Bilateral     There were no vitals filed for this visit.    Subjective Assessment - 11/26/22 1420     Subjective Neck pain: 8/10 currently, 10/10 at most for the past 3 months. R UE pain radiating down C6/C7 dermatome, not past R elbow all the time. Whole R hand would be numb at times. R UE: 6/10 currently, 10/10 at most for the past 3 months.    Pertinent History Cervical radiculopathy. Hx of cervical fusion C3-C7 (2019). Neck pain was better. Had R UE nerve palsey afterwards but got better about 1.5 years ago. However September 2023 pt felt like a ligament in her neck rolled.  Pt fell October 2023. Had a CT scan recently and her neck hardware was fine. January 2024, pt was walking her bulldog, pt was looking to the L and her dog puller her R arm backwards and pt went to the ER aferwards. R hand dominiant. Gets posterior headaches.    Patient Stated Goals Be able to move her shoulder more comfortably, improve ability to move her head, neck and R shoulder more comfortably.    Currently in Pain? Yes    Pain Score 8     Pain Location Neck    Pain Orientation Right;Posterior;Mid;Lower    Pain Descriptors / Indicators Aching;Tightness;Tender;Sore    Pain Type Chronic pain    Pain Radiating Towards R C6, C7 dermatome.    Pain Onset More than a month ago    Pain Frequency Constant    Aggravating Factors  typing in front of a computer, sitting, turning her head, looking up > looking down    Pain Relieving Factors improve posture (B scapular retraction), using a head rest, light massage, rubbing castor oil. Gabapentin                [facility] PT Assessment - 11/26/22 1437       Assessment   Medical Diagnosis M54.12 (ICD-10-CM) - Cervical radiculopathy  Z98.1 (ICD-10-CM) - S/P cervical  spinal fusion    Referring Provider (PT) Geronimo Boot, PA-C    Onset Date/Surgical Date 11/05/22   Date PT referral signed. Chronic condition   Prior Therapy None for this condition.      Precautions   Precaution Comments C3-7 cervical fusion      Restrictions   Other Position/Activity Restrictions No known restrictions      Balance Screen   Has the patient fallen in the past 6 months Yes    How many times? 19 June 2022, pt stood up, lost balance, L ankle would roll in and caused her to fall the other 2 times.   Has the patient had a decrease in activity level because of a fear of falling?  No    Is the patient reluctant to leave their home because of a fear of falling?  No      Home Environment   Additional Comments Pt lives in a one story home with 3 dogs, a cat, and a  chinken. 2 steps to enter front door, no rails.      Posture/Postural Control   Posture Comments B protracted shoulder, B scapular winging, R lumbar convexity.      AROM   Overall AROM Comments L UE WFL    Right Shoulder Flexion 72 Degrees   95 with R hand tingling   Right Shoulder ABduction 93 Degrees   103 AAROM   Cervical Flexion WFL with pulling around C3/4 and T3 area.    Cervical Extension limited with neck pain around C3/4 area    Cervical - Right Side Bend limited with neck and R upper trap/shouldre pain around C6 dermatome    Cervical - Left Side Bend limited with neck pain around C3/4 area    Cervical - Right Rotation 25   with upper thoracic pain   Cervical - Left Rotation 25   with L C5/C6 dermatome pain L upper trap area     Strength   Overall Strength Comments Grip: R 40 lbs, 51 lbs, 44 lbs (45 lbs average); L 60 lbs, 66 lbs, 66 lbs (64 lbs average)   seated manually resisted scapular retraction targeting lower trap 4-/5 R and L with pain.   Right Shoulder Flexion 4/5   with pain around C5 dermatome   Right Shoulder ABduction 4/5   with R latearl neck and shoulder pain around C5 dermatome.   Left Shoulder Flexion 5/5    Left Shoulder ABduction 4+/5    Right Elbow Flexion 4-/5   Difficulty getting into position, R anterior shouldre pain.   Right Elbow Extension 4/5    Left Elbow Flexion 4/5    Left Elbow Extension 5/5    Right Wrist Extension 4-/5   with upper thoracic pain   Left Wrist Extension 4/5      Palpation   Palpation comment muscle tension B upper cervical paraspinal muscles, R upper trap muscle tension, TTP posterior neck, R upper trap, scapular, and thoracic area.                        Objective measurements completed on examination: See above findings.   TTP around C3/C4 area.  No blood pressure problems per pt.  No latex alleriges   Irritable symptoms with neural tension observed R UE  Therapeutic exercise  Seated B scapular  retraction 30 seconds  Cramping R posterior shoulder  Reclined  Hooklying   B scapular retraction 10x5 seconds  Cervical nod 10x5 seconds   Cues for pain free ROM   Reviewed initial HEP. Pt demonstrated and verbalized understanding. Handout provided.     Improved exercise technique, movement at target joints, use of target muscles after mod verbal, visual, tactile cues.     Reviewed POC: 1-2x/week as able (uses [employer] for transportation)   Response to treatment Decreased B scapular muscle tension reported afterwards    Clinical impression Pt is a 52 year old female who came to physical therapy secondary to chronic neck pain with R UE radiating symptoms. She also presents with altered posture, irritability of symptoms, decreased cervical and R shoulder AROM, R UE weakness, and difficulty performing tasks which involve moving her head, working in front of a computer, as well as reaching with her R arm secondary to pain. Pt will benefit from skilled physical therapy services to address the aforementioned deficits.            Access Code: [email or web address] URL: [email or web address] Date: 11/26/2022 Prepared by: Joneen Boers  Exercises - Supine Scapular Retraction  - 3 x daily - 7 x weekly - 3 sets - 10 reps - 5 seconds hold - Supine Cervical Retraction with Towel  - 1 x daily - 7 x weekly - 3 sets - 10 reps - 5 seconds hold                      PT Education - 11/26/22 1745     Education Details ther-ex, HEP, POC    Person(s) Educated Patient    Methods Explanation;Tactile cues;Demonstration;Verbal cues;Handout    Comprehension Verbalized understanding;Returned demonstration              PT Short Term Goals - 11/26/22 1737       PT SHORT TERM GOAL #1   Title Patient will be independent with her initial HEP to decrease pain, improve ability to look around, reach with R UE more comfortably.    Baseline Pt has started her initial HEP  (11/26/2022)    Time 3    Period Weeks    Status New    Target Date 12/20/22               PT Long Term Goals - 11/26/22 1739       PT LONG TERM GOAL #1   Title Pt will have a decrease in neck pain to 5/10 or less at worst to promote ability to move her head, look around, reach with her R UE more comfortably.    Baseline 10/10 neck pain at worst for the past 3 months (11/26/2022)    Time 8    Period Weeks    Status New    Target Date 01/24/23      PT LONG TERM GOAL #2   Title Pt will have a decrease in R UE pain to 4/10 or less at worst to promote ability to reach more comfortably.    Baseline 10/10 R UE pain at worst for the past 3 months (11/26/2022)    Time 8    Period Weeks    Status New    Target Date 01/24/23      PT LONG TERM GOAL #3   Title Pt will improve her neck FOTO score by at least 10 points as a demonstration of improved function.    Baseline Neck FOTO 30 (11/26/2022)    Time 8    Period Weeks    Status New    Target  Date 01/24/23      PT LONG TERM GOAL #4   Title Pt will improve R shoulder flexion, abduction, elbow flexion, elbow extension, wrist extension by at least 1/2 MMT grade to improve ability to use R UE for functional tasks.    Baseline R shoulder flexion 4/5, abduction 4/5, elbow flexion 4-/5, elbow extension 4/5, wrist extension 4-/5 (11/26/2022)    Time 8    Period Weeks    Status New    Target Date 01/24/23                    Plan - 11/26/22 1538     Clinical Impression Statement Pt is a 52 year old female who came to physical therapy secondary to chronic neck pain with R UE radiating symptoms. She also presents with altered posture, irritability of symptoms, decreased cervical and R shoulder AROM, R UE weakness, and difficulty performing tasks which involve moving her head, working in front of a computer, as well as reaching with her R arm secondary to pain. Pt will benefit from skilled physical therapy services to address the  aforementioned deficits.    Personal Factors and Comorbidities Comorbidity 3+;Fitness;Past/Current Experience;Time since onset of injury/illness/exacerbation;Transportation    Comorbidities Anxiety, arthritis, depression, polysubstance abuse, schizophrenia    Examination-Activity Limitations Reach Overhead;Sit;Other   looking around   Stability/Clinical Decision Making Evolving/Moderate complexity   Pain seems to be worsening based on subjective reports   Clinical Decision Making Moderate    Rehab Potential Fair    PT Frequency 2x / week   1-2x/week depending of transportation and finances per pt input.   PT Duration 8 weeks    PT Treatment/Interventions Neuromuscular re-education;Therapeutic activities;Therapeutic exercise;Patient/family education;Manual techniques;Dry needling;Electrical Stimulation;Iontophoresis '4mg'$ /ml Dexamethasone    PT Next Visit Plan posture, scapular and anterior cervical strengthening, manual techniques, modaltities PRN    PT Home Exercise Plan Medbridge Access Code: [email or web address]    Consulted and Agree with Plan of Care Patient             Patient will benefit from skilled therapeutic intervention in order to improve the following deficits and impairments:  Pain, Postural dysfunction, Improper body mechanics, Decreased strength, Decreased range of motion  Visit Diagnosis: Cervicalgia - Plan: PT plan of care cert/re-cert  Radiculopathy, cervical region - Plan: PT plan of care cert/re-cert     Problem List Patient Active Problem List   Diagnosis Date Noted   Cervical radiculopathy 08/06/2018   Chronic upper extremity pain (Secondary Area of Pain) (Bilateral) (R>L) 07/28/2018   DDD (degenerative disc disease), cervical 07/28/2018   Chronic neck pain (Primary Area of Pain) (Bilateral) (R>L) 07/15/2018   Chronic knee pain (Tertiary Area of Pain) (Right) 07/15/2018   Chronic low back pain ([name]) (Bilateral) (R>L) w/ sciatica (Bilateral) 07/15/2018    Chronic lower extremity pain (Bilateral) (R>L) 07/15/2018   Chronic hand pain (Bilateral) (L>R) 07/15/2018   Chronic pain syndrome 07/15/2018   Long term current use of opiate analgesic 07/15/2018   Pharmacologic therapy 07/15/2018   Disorder of skeletal system 07/15/2018   Problems influencing health status 07/15/2018   Lower extremity weakness 07/15/2018   Arm numbness 04/21/2018   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 [place], [address] Phone: [phone]   Fax:  [phone]  Name: Mercedes Forbes MRN: [id number] Date of Birth: 1971/05/24

## 2022-11-28 ENCOUNTER — Ambulatory Visit: Payer: Medicare Other

## 2022-12-03 ENCOUNTER — Ambulatory Visit: Payer: Medicare Other

## 2022-12-03 DIAGNOSIS — M5412 Radiculopathy, cervical region: Secondary | ICD-10-CM

## 2022-12-03 DIAGNOSIS — M542 Cervicalgia: Secondary | ICD-10-CM | POA: Diagnosis not present

## 2022-12-03 NOTE — Therapy (Signed)
OUTPATIENT PHYSICAL THERAPY TREATMENT NOTE   Patient Name: Mercedes Forbes MRN: [id number] DOB:[birth year], 52 y.o., female Today's Date: 54/18/2024  PCP: Bunnie Pion, FNP  REFERRING PROVIDER: Geronimo Boot, PA-C   END OF SESSION:  PT End of Session - 12/03/22 1420     Visit Number 2    Number of Visits 17    Date for PT Re-Evaluation 01/24/23    PT Start Time [id number]    PT Stop Time 1500    PT Time Calculation (min) 40 min    Activity Tolerance Patient tolerated treatment well    Behavior During Therapy WFL for tasks assessed/performed             Past Medical History:  Diagnosis Date   Allergy    Anxiety    Arthritis    Bipolar 1 disorder ([name])    Depression    GERD (gastroesophageal reflux disease)    Headache    migraines   Personality disorder ([place])    Polysubstance abuse ([place])    Schizophrenia ([place])    Past Surgical History:  Procedure Laterality Date   ANTERIOR CERVICAL DECOMPRESSION/DISCECTOMY FUSION 4 LEVELS N/A 08/06/2018   Procedure: ANTERIOR CERVICAL DECOMPRESSION/DISCECTOMY FUSION 4 LEVELS-c3-7;  Surgeon: Meade Maw, MD;  Location: [facility] ORS;  Service: Neurosurgery;  Laterality: N/A;   KNEE ARTHROSCOPY WITH PATELLA RECONSTRUCTION     left patella removed   wrists Bilateral    Patient Active Problem List   Diagnosis Date Noted   Cervical radiculopathy 08/06/2018   Chronic upper extremity pain (Secondary Area of Pain) (Bilateral) (R>L) 07/28/2018   DDD (degenerative disc disease), cervical 07/28/2018   Chronic neck pain (Primary Area of Pain) (Bilateral) (R>L) 07/15/2018   Chronic knee pain (Tertiary Area of Pain) (Right) 07/15/2018   Chronic low back pain ([name]) (Bilateral) (R>L) w/ sciatica (Bilateral) 07/15/2018   Chronic lower extremity pain (Bilateral) (R>L) 07/15/2018   Chronic hand pain (Bilateral) (L>R) 07/15/2018   Chronic pain syndrome 07/15/2018   Long term current use of opiate analgesic 07/15/2018    Pharmacologic therapy 07/15/2018   Disorder of skeletal system 07/15/2018   Problems influencing health status 07/15/2018   Lower extremity weakness 07/15/2018   Arm numbness 04/21/2018   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 Hx of cervical fusion C3-C7 (2019). Neck pain was better. Had R UE nerve palsey afterwards but got better about 1.5 years ago. However September 2023 pt felt like a ligament in her neck rolled. Pt fell October 2023. Had a CT scan recently and her neck hardware was fine. January 2024, pt was walking her bulldog, pt was looking to the L and her dog puller her R arm backwards and pt went to the ER aferwards. R hand dominiant. Gets posterior headaches.   PRECAUTIONS: C3-7 cervical fusion    SUBJECTIVE:   SUBJECTIVE STATEMENT: The exercises are working. 7/10 currently. Might not be able to come to PT next Monday.    PAIN:  Are you having pain? See subjective   TODAY'S  TREATMENT:                                                                                                                                         DATE: 54/18/2024   TTP around C3/C4 area.  No blood pressure problems per pt.  No latex alleriges    Irritable symptoms with neural tension observed R UE   Pt email for HEP:  [email or web address]    Therapeutic exercise   Seated chin tucks 10x5 seconds for 3 sets  Supine cervical nod 10x3 with 5  second holds  Hooklying   B shoulder horozontal abduction to promote upper thoracic extension to decrease stress to cervical spine 10x5 seconds    Seated B scapular retraction 10x5 seconds   R First rib stretch with strap with cervical rotation 10x3 each direction      Improved exercise technique, movement at target joints, use of target muscles after mod verbal, visual, tactile cues.    Response to treatment Decreased R upper trap and posterior thoracic muscle tension reported after session.        Clinical impression Worked on improving cervical posture, B scapular and anterior cervical muscle strength, and decreasing R scalene muscle tension to her neck. Improved comfort level reported after session. Pt will benefit from continued skilled physical therapy services to decrease pain, improve strength, and function.      PATIENT EDUCATION: Education details: there-ex, HEP Person educated: Patient Education method: Explanation, Demonstration, Tactile cues, Verbal cues, and Handouts Education comprehension: verbalized understanding and returned demonstration  HOME EXERCISE PROGRAM: Access Code: [email or web address] URL: [email or web address] Date: 11/26/2022 Prepared by: Joneen Boers   Exercises - Supine Scapular Retraction  - 3 x daily - 7 x weekly - 3 sets - 10 reps - 5 seconds hold - Supine Cervical Retraction with Towel  - 1 x daily - 7 x weekly - 3 sets - 10 reps - 5 seconds hold - Seated Cervical Retraction  - 1 x daily - 7 x weekly - 3 sets - 10 reps - 5 seconds hold   First rib stretch with strap with cervical rotation 10x3 each direction    PT Short Term Goals - 11/26/22 1737       PT SHORT TERM GOAL #1   Title Patient will be independent with her initial HEP to decrease pain, improve ability to look around, reach with R UE more comfortably.    Baseline Pt has started her initial HEP (11/26/2022)    Time 3    Period Weeks    Status New    Target Date  12/20/22              PT Long Term Goals - 11/26/22 1739       PT LONG TERM GOAL #1   Title Pt will have a decrease in neck pain to 5/10 or less at worst to promote ability to  move her head, look around, reach with her R UE more comfortably.    Baseline 10/10 neck pain at worst for the past 3 months (11/26/2022)    Time 8    Period Weeks    Status New    Target Date 01/24/23      PT LONG TERM GOAL #2   Title Pt will have a decrease in R UE pain to 4/10 or less at worst to promote ability to reach more comfortably.    Baseline 10/10 R UE pain at worst for the past 3 months (11/26/2022)    Time 8    Period Weeks    Status New    Target Date 01/24/23      PT LONG TERM GOAL #3   Title Pt will improve her neck FOTO score by at least 10 points as a demonstration of improved function.    Baseline Neck FOTO 30 (11/26/2022)    Time 8    Period Weeks    Status New    Target Date 01/24/23      PT LONG TERM GOAL #4   Title Pt will improve R shoulder flexion, abduction, elbow flexion, elbow extension, wrist extension by at least 1/2 MMT grade to improve ability to use R UE for functional tasks.    Baseline R shoulder flexion 4/5, abduction 4/5, elbow flexion 4-/5, elbow extension 4/5, wrist extension 4-/5 (11/26/2022)    Time 8    Period Weeks    Status New    Target Date 01/24/23              Plan - 12/03/22 1418     Clinical Impression Statement Worked on improving cervical posture, B scapular and anterior cervical muscle strength, and decreasing R scalene muscle tension to her neck. Improved comfort level reported after session. Pt will benefit from continued skilled physical therapy services to decrease pain, improve strength, and function.    Personal Factors and Comorbidities Comorbidity 3+;Fitness;Past/Current Experience;Time since onset of injury/illness/exacerbation;Transportation    Comorbidities Anxiety, arthritis, depression, polysubstance abuse, schizophrenia     Examination-Activity Limitations Reach Overhead;Sit;Other   looking around   Stability/Clinical Decision Making Evolving/Moderate complexity   Pain seems to be worsening based on subjective reports   Rehab Potential Fair    PT Frequency 2x / week   1-2x/week depending of transportation and finances per pt input.   PT Duration 8 weeks    PT Treatment/Interventions Neuromuscular re-education;Therapeutic activities;Therapeutic exercise;Patient/family education;Manual techniques;Dry needling;Electrical Stimulation;Iontophoresis 4mg /ml Dexamethasone    PT Next Visit Plan posture, scapular and anterior cervical strengthening, manual techniques, modaltities PRN    PT Home Exercise Plan Medbridge Access Code: [email or web address]    Consulted and Agree with Plan of Care Patient              Joneen Boers PT, DPT  54/18/2024, 6:42 PM

## 2022-12-05 ENCOUNTER — Ambulatory Visit: Payer: Medicare Other

## 2022-12-10 ENCOUNTER — Ambulatory Visit: Payer: Medicare Other

## 2022-12-12 ENCOUNTER — Ambulatory Visit: Payer: Medicare Other

## 2022-12-18 ENCOUNTER — Ambulatory Visit: Payer: Medicare Other

## 2022-12-20 ENCOUNTER — Ambulatory Visit: Payer: Medicare Other
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
## 2022-12-25 ENCOUNTER — Ambulatory Visit: Payer: Medicare Other | Attending: Neurosurgery

## 2022-12-25 ENCOUNTER — Telehealth: Payer: Self-pay

## 2022-12-25 NOTE — Telephone Encounter (Signed)
No show. Called pt who said that she still has a sinus infection and does not know if she is still contagious. Has an appointment with her doctor this Friday. Cancel this Thursday appointment. Hopefully she can make it next week.

## 2022-12-26 ENCOUNTER — Telehealth (INDEPENDENT_AMBULATORY_CARE_PROVIDER_SITE_OTHER): Payer: Medicare Other | Admitting: Psychiatry

## 2022-12-26 ENCOUNTER — Encounter: Payer: Self-pay | Admitting: Psychiatry

## 2022-12-26 DIAGNOSIS — F411 Generalized anxiety disorder: Secondary | ICD-10-CM

## 2022-12-26 DIAGNOSIS — F3181 Bipolar II disorder: Secondary | ICD-10-CM

## 2022-12-26 DIAGNOSIS — F4310 Post-traumatic stress disorder, unspecified: Secondary | ICD-10-CM

## 2022-12-26 MED ORDER — PRAZOSIN HCL 2 MG PO CAPS: 2.0000 mg | ORAL_CAPSULE | Freq: Every day | ORAL | 1 refills | Status: DC

## 2022-12-26 MED ORDER — DULOXETINE HCL 60 MG PO CPEP: 60.0000 mg | ORAL_CAPSULE | Freq: Every day | ORAL | 1 refills | Status: DC

## 2022-12-26 NOTE — Patient Instructions (Signed)
Continue duloxetine 60 mg daily t Continue Depakote 1500 mg at night  Decrease Buspar 15 mg twice a day for two weeks, then 7.5 mg twice a day for two weeks, then discontinue  Continue prazosin 2 mg at night  Obtain labs at [place]- CBC, CMP, VPA Next appointment: 5/29 at 11 am

## 2022-12-27 ENCOUNTER — Ambulatory Visit: Payer: Medicare Other

## 2022-12-31 ENCOUNTER — Telehealth: Payer: Self-pay

## 2022-12-31 ENCOUNTER — Ambulatory Visit: Payer: Medicare Other

## 2022-12-31 NOTE — Progress Notes (Deleted)
Referring Physician:  Drake Leach, PA-C [address] [place],  [place] [address]  Primary Physician:  Lorn Junes, FNP  History of Present Illness: 12/31/2022 [name] is here today with a chief complaint of ***  Duration: *** Location: *** Quality: *** Severity: ***  Precipitating: aggravated by *** Modifying factors: made better by *** Weakness: none Timing: *** Bowel/Bladder Dysfunction: none  Conservative measures:  Physical therapy: ***  Multimodal medical therapy including regular antiinflammatories: ***  Injections: *** epidural steroid injections  Past Surgery: ***  Mercedes Forbes has ***no symptoms of cervical myelopathy.  The symptoms are causing a significant impact on the patient's life.   I have utilized the care everywhere function in epic to review the outside records available from external health systems.  Review of Systems:  A 10 point review of systems is negative, except for the pertinent positives and negatives detailed in the HPI.  Past Medical History: Past Medical History:  Diagnosis Date   Allergy    Anxiety    Arthritis    Bipolar 1 disorder    Depression    GERD (gastroesophageal reflux disease)    Headache    migraines   Personality disorder    Polysubstance abuse    Schizophrenia     Past Surgical History: Past Surgical History:  Procedure Laterality Date   ANTERIOR CERVICAL DECOMPRESSION/DISCECTOMY FUSION 4 LEVELS N/A 08/06/2018   Procedure: ANTERIOR CERVICAL DECOMPRESSION/DISCECTOMY FUSION 4 LEVELS-c3-7;  Surgeon: Venetia Night, MD;  Location: [facility] ORS;  Service: Neurosurgery;  Laterality: N/A;   KNEE ARTHROSCOPY WITH PATELLA RECONSTRUCTION     left patella removed   wrists Bilateral     Allergies: Allergies as of 01/01/2023 - Review Complete 12/26/2022  Allergen Reaction Noted   Aspirin Other (See Comments) and Shortness Of Breath 06/09/2014   Flurbiprofen Shortness Of  Breath 06/09/2014   Nsaids Shortness Of Breath 12/20/2016   Tramadol Itching 10/12/2022    Medications:  Current Outpatient Medications:    busPIRone (BUSPAR) 15 MG tablet, Take 2 tablets (30 mg total) by mouth 2 (two) times daily., Disp: 360 tablet, Rfl: 0   divalproex (DEPAKOTE ER) 500 MG 24 hr tablet, Take 3 tablets (1,500 mg total) by mouth daily., Disp: 270 tablet, Rfl: 0   DULoxetine (CYMBALTA) 60 MG capsule, Take 1 capsule (60 mg total) by mouth daily., Disp: 90 capsule, Rfl: 1   gabapentin (NEURONTIN) 600 MG tablet, Take 1 tablet (600 mg total) by mouth 3 (three) times daily., Disp: 90 tablet, Rfl: 1   [START ON 01/01/2023] prazosin (MINIPRESS) 2 MG capsule, Take 1 capsule (2 mg total) by mouth at bedtime., Disp: 90 capsule, Rfl: 1  Social History: Social History   Tobacco Use   Smoking status: Former    Packs/day: .25    Types: Cigarettes    Start date: 05/01/2021   Smokeless tobacco: Never   Tobacco comments:    No e cis, vape, chew  Vaping Use   Vaping Use: Never used  Substance Use Topics   Alcohol use: No   Drug use: Not Currently    Types: Cocaine, Amphetamines, Marijuana    Comment: last 1 month ago    Family Medical History: Family History  Problem Relation Age of Onset   Heart disease Mother    Hyperlipidemia Mother    Hypertension Mother    Kidney disease Mother    Arthritis Mother    Stroke Father    Diabetes Father  Physical Examination: There were no vitals filed for this visit.  General: Patient is well developed, well nourished, calm, collected, and in no apparent distress. Attention to examination is appropriate.  Neck:   Supple.  Full range of motion.  Respiratory: Patient is breathing without any difficulty.   NEUROLOGICAL:     Awake, alert, oriented to person, place, and time.  Speech is clear and fluent.   Cranial Nerves: Pupils equal round and reactive to light.  Facial tone is symmetric.  Facial sensation is symmetric.  Shoulder shrug is symmetric. Tongue protrusion is midline.  There is no pronator drift.  ROM of spine: full.    Strength: Side Biceps Triceps Deltoid Interossei Grip Wrist Ext. Wrist Flex.  R 5 5 5 5 5 5 5   L 5 5 5 5 5 5 5    Side Iliopsoas Quads Hamstring PF DF EHL  R 5 5 5 5 5 5   L 5 5 5 5 5 5    Reflexes are ***2+ and symmetric at the biceps, triceps, brachioradialis, patella and achilles.   Hoffman's is absent.   Bilateral upper and lower extremity sensation is intact to light touch.    No evidence of dysmetria noted.  Gait is normal.     Medical Decision Making  Imaging: ***  I have personally reviewed the images and agree with the above interpretation.  Assessment and Plan: Ms. Bartnicki is a pleasant 52 y.o. female with ***    Thank you for involving me in the care of this patient.      Chester K. Myer Haff MD, [facility] Neurosurgery

## 2022-12-31 NOTE — Telephone Encounter (Signed)
No show. Called pt pertaining to today's appointment schedule. Return phone call requested. Phone number [phone]) provided.

## 2023-01-01 ENCOUNTER — Ambulatory Visit: Payer: Self-pay | Admitting: Neurosurgery

## 2023-01-07 DIAGNOSIS — M5412 Radiculopathy, cervical region: Secondary | ICD-10-CM

## 2023-01-07 DIAGNOSIS — Z981 Arthrodesis status: Secondary | ICD-10-CM

## 2023-01-07 MED ORDER — GABAPENTIN 600 MG PO TABS: 600.0000 mg | ORAL_TABLET | Freq: Three times a day (TID) | ORAL | 1 refills | Status: DC

## 2023-01-07 NOTE — Telephone Encounter (Signed)
Review of neurontin okay. Sent to pharmacy.
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
## 2023-02-12 ENCOUNTER — Telehealth: Payer: Self-pay | Admitting: Psychiatry

## 2023-02-12 ENCOUNTER — Encounter: Payer: Medicare Other | Admitting: Psychiatry
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
## 2023-02-12 NOTE — Progress Notes (Signed)
This encounter was created in error - please disregard.

## 2023-02-12 NOTE — Telephone Encounter (Signed)
Sent a video visit link through Epic, but the patient didn't sign in. Tried calling for today's appointment, but got no answer. Left a voicemail instructing the patient to contact the office at [phone].  

## 2023-02-13 ENCOUNTER — Telehealth: Payer: Medicare Other | Admitting: Psychiatry

## 2023-02-14 ENCOUNTER — Encounter: Payer: Self-pay | Admitting: Psychiatry

## 2023-02-14 ENCOUNTER — Telehealth (INDEPENDENT_AMBULATORY_CARE_PROVIDER_SITE_OTHER): Payer: Medicare Other | Admitting: Psychiatry

## 2023-02-14 DIAGNOSIS — F3181 Bipolar II disorder: Secondary | ICD-10-CM | POA: Diagnosis not present

## 2023-02-14 DIAGNOSIS — F4310 Post-traumatic stress disorder, unspecified: Secondary | ICD-10-CM | POA: Diagnosis not present

## 2023-02-14 DIAGNOSIS — F411 Generalized anxiety disorder: Secondary | ICD-10-CM | POA: Diagnosis not present

## 2023-02-14 MED ORDER — ARIPIPRAZOLE 2 MG PO TABS: 2.0000 mg | ORAL_TABLET | Freq: Every day | ORAL | 0 refills | Status: DC

## 2023-02-21 ENCOUNTER — Other Ambulatory Visit: Payer: Self-pay | Admitting: Psychiatry

## 2023-02-21 NOTE — Telephone Encounter (Signed)
Please schedule her a f/u with me for recheck neck.

## 2023-02-22 NOTE — Progress Notes (Unsigned)
Referring Physician:  Lorn Junes, FNP [id number] [place],  [place] [address]  Primary Physician:  Lorn Junes, FNP  History of Present Illness: 02/22/2023 [name] has a history of bipolar, migraines, schizophrenia, hypothyroidism, and history of cocaine abuse.  She is s/p ACDF C3-C7 by Dr. Myer Haff on 08/06/18. She had initial improvement, but then pain returned in right arm.   Last seen by me on 11/02/22 for pain that got worse after a fall in October of 2023.   She has constant right sided neck pain that radiates down arm to right hand. She has intermittent numbness/tingling in right hand. She notes weakness in her right hand- this is not new.    She does not appear to be fused at C3-C4 and C6-C7. This was seen on previous imaging.   She was sent to PT at her last visit and was only able to go to 2 visits due to transportation and financial issues.   She is here for follow up.              She then had some improvement in pain for about a year, but then pain got worse after fall in October of 2023. She was also seen in ED on 10/10/22 with increased neck pain with tingling in right arm after walking her dog- he went one way and pulled her arm.   She has constant right sided neck pain that radiates down arm to right hand. She has intermittent numbness/tingling in right hand. She notes weakness in her right hand- this is not new. No left arm pain or symptoms. No specific aggravating factors.   Given ultram from ED.   Stopped smoking in August 2022.   Bowel/Bladder Dysfunction: none  Conservative measures:  Physical therapy: nothing recent  Multimodal medical therapy including regular antiinflammatories: neurontin  Injections: No recent epidural steroid injections  Past Surgery: ACDF C3-C7 by Dr. Myer Haff on 08/06/18  [name] has  no symptoms of cervical myelopathy.  The symptoms are causing a significant impact on the patient's  life.   Review of Systems:  A 10 point review of systems is negative, except for the pertinent positives and negatives detailed in the HPI.  Past Medical History: Past Medical History:  Diagnosis Date   Allergy    Anxiety    Arthritis    Bipolar 1 disorder (HCC)    Depression    GERD (gastroesophageal reflux disease)    Headache    migraines   Personality disorder (HCC)    Polysubstance abuse (HCC)    Schizophrenia (HCC)     Past Surgical History: Past Surgical History:  Procedure Laterality Date   ANTERIOR CERVICAL DECOMPRESSION/DISCECTOMY FUSION 4 LEVELS N/A 08/06/2018   Procedure: ANTERIOR CERVICAL DECOMPRESSION/DISCECTOMY FUSION 4 LEVELS-c3-7;  Surgeon: Venetia Night, MD;  Location: [facility] ORS;  Service: Neurosurgery;  Laterality: N/A;   KNEE ARTHROSCOPY WITH PATELLA RECONSTRUCTION     left patella removed   wrists Bilateral     Allergies: Allergies as of 02/25/2023 - Review Complete 02/14/2023  Allergen Reaction Noted   Aspirin Other (See Comments) and Shortness Of Breath 06/09/2014   Flurbiprofen Shortness Of Breath 06/09/2014   Nsaids Shortness Of Breath 12/20/2016   Tramadol Itching 10/12/2022    Medications: Outpatient Encounter Medications as of 02/25/2023  Medication Sig   ARIPiprazole (ABILIFY) 2 MG tablet Take 1 tablet (2 mg total) by mouth at bedtime.   divalproex (DEPAKOTE ER) 500 MG 24 hr tablet  Take 3 tablets (1,500 mg total) by mouth daily.   DULoxetine (CYMBALTA) 60 MG capsule Take 1 capsule (60 mg total) by mouth daily.   gabapentin (NEURONTIN) 600 MG tablet Take 1 tablet (600 mg total) by mouth 3 (three) times daily.   prazosin (MINIPRESS) 2 MG capsule Take 1 capsule (2 mg total) by mouth at bedtime.   No facility-administered encounter medications on file as of 02/25/2023.    Social History: Social History   Tobacco Use   Smoking status: Former    Packs/day: .25    Types: Cigarettes    Start date: 05/01/2021   Smokeless tobacco: Never    Tobacco comments:    No e cis, vape, chew  Vaping Use   Vaping Use: Never used  Substance Use Topics   Alcohol use: No   Drug use: Not Currently    Types: Cocaine, Amphetamines, Marijuana    Comment: last 1 month ago    Family Medical History: Family History  Problem Relation Age of Onset   Heart disease Mother    Hyperlipidemia Mother    Hypertension Mother    Kidney disease Mother    Arthritis Mother    Stroke Father    Diabetes Father     Physical Examination: There were no vitals filed for this visit.    Awake, alert, oriented to person, place, and time.  Speech is clear and fluent. Fund of knowledge is appropriate.   Cranial Nerves: Pupils equal round and reactive to light.  Facial tone is symmetric.    Mild lower posterior cervical tenderness. positive tenderness in right trapezial region.   No abnormal lesions on exposed skin.   Strength: Side Biceps Triceps Deltoid Interossei Grip Wrist Ext. Wrist Flex.  R 5 4+ 5 4+ 5 4+ 5  L 5 5 5 5 5 5 5    Side Iliopsoas Quads Hamstring PF DF EHL  R 5 5 5 5 5 5   L 5 5 5 5 5 5    Reflexes are 2+ and symmetric at the biceps, triceps, brachioradialis, achilles.   Hoffman's is absent.  Clonus is not present.   Bilateral upper and lower extremity sensation is intact to light touch.     She has a slight limp due to left knee pain.    Medical Decision Making  Imaging: None   Assessment and Plan: Mercedes Forbes is a pleasant 52 y.o. female is s/p ACDF C3-C7 by Dr. Myer Haff on 08/06/18. She had initial improvement, but then pain returned in right arm.   She then had some improvement in pain for about a year, but then pain got worse after fall in October of 2023.   She has constant right sided neck pain that radiates down arm to right hand. She has intermittent numbness/tingling in right hand. She notes weakness in her right hand- this is not new.   In review of above CT, she does not appear to be fused at C3-C4 and C6-C7.  This was seen on previous imaging.   Treatment options discussed with patient and following plan made:   - Will need to review with Dr. Myer Haff and call her with further plan.  - In the interim, she can increase her neurontin. She did okay with 600mg  tid in the past. She is currently on 300mg  tid. She will take 300mg  bid and increase to 600mg  at night. Will call if she needs a refill.  - Of note, she quit smoking in [place] of 2022.   I  spent a total of 25 minutes in face-to-face and non-face-to-face activities related to this patient's care today including review of outside records, review of imaging, review of symptoms, physical exam, discussion of differential diagnosis, discussion of treatment options, and documentation.   Thank you for involving me in the care of this patient.   ADDENDUM 11/05/22:  Reviewed with Dr. Myer Haff. Would need to complete conservative treatment prior to considering surgery options (PT). Will find out where she wants to go to PT and then order. Will then have her f/u with Dr. Myer Haff in 6-8 weeks.   Drake Leach PA-C Dept. of Neurosurgery

## 2023-02-25 ENCOUNTER — Ambulatory Visit (INDEPENDENT_AMBULATORY_CARE_PROVIDER_SITE_OTHER): Payer: Medicare Other | Admitting: Orthopedic Surgery

## 2023-02-25 ENCOUNTER — Encounter: Payer: Self-pay | Admitting: Orthopedic Surgery

## 2023-02-25 ENCOUNTER — Other Ambulatory Visit
Admission: RE | Admit: 2023-02-25 | Discharge: 2023-02-25 | Disposition: A | Discharge status: Home / Self Care | Payer: Medicare Other | Attending: Psychiatry | Admitting: Psychiatry

## 2023-02-25 VITALS — BP 140/88 | Ht 67.0 in | Wt 136.0 lb

## 2023-02-25 DIAGNOSIS — M5412 Radiculopathy, cervical region: Secondary | ICD-10-CM | POA: Diagnosis not present

## 2023-02-25 DIAGNOSIS — F3181 Bipolar II disorder: Secondary | ICD-10-CM | POA: Diagnosis present

## 2023-02-25 DIAGNOSIS — M542 Cervicalgia: Secondary | ICD-10-CM | POA: Diagnosis not present

## 2023-02-25 DIAGNOSIS — S129XXS Fracture of neck, unspecified, sequela: Secondary | ICD-10-CM

## 2023-02-25 DIAGNOSIS — Z981 Arthrodesis status: Secondary | ICD-10-CM | POA: Diagnosis not present

## 2023-02-25 LAB — CBC
HCT: 46.1 % — ABNORMAL HIGH (ref 36.0–46.0)
Hemoglobin: 15 g/dL (ref 12.0–15.0)
MCH: 30.6 pg (ref 26.0–34.0)
MCHC: 32.5 g/dL (ref 30.0–36.0)
MCV: 94.1 fL (ref 80.0–100.0)
Platelets: 344 10*3/uL (ref 150–400)
RBC: 4.9 MIL/uL (ref 3.87–5.11)
RDW: 14.3 % (ref 11.5–15.5)
WBC: 10.2 10*3/uL (ref 4.0–10.5)
nRBC: 0 % (ref 0.0–0.2)

## 2023-02-25 LAB — COMPREHENSIVE METABOLIC PANEL
ALT: 13 U/L (ref 0–44)
AST: 25 U/L (ref 15–41)
Albumin: 4.6 g/dL (ref 3.5–5.0)
Alkaline Phosphatase: 82 U/L (ref 38–126)
Anion gap: 12 (ref 5–15)
BUN: 17 mg/dL (ref 6–20)
CO2: 25 mmol/L (ref 22–32)
Calcium: 9.3 mg/dL (ref 8.9–10.3)
Chloride: 104 mmol/L (ref 98–111)
Creatinine, Ser: 1.04 mg/dL — ABNORMAL HIGH (ref 0.44–1.00)
GFR, Estimated: >60 mL/min (ref >60)
Glucose, Bld: 132 mg/dL — ABNORMAL HIGH (ref 70–99)
Potassium: 4.3 mmol/L (ref 3.5–5.1)
Sodium: 141 mmol/L (ref 135–145)
Total Bilirubin: 0.6 mg/dL (ref 0.3–1.2)
Total Protein: 8.6 g/dL — ABNORMAL HIGH (ref 6.5–8.1)

## 2023-02-25 LAB — TSH: TSH: 8.645 u[IU]/mL — ABNORMAL HIGH (ref 0.350–4.500)

## 2023-02-25 LAB — VALPROIC ACID LEVEL: Valproic Acid Lvl: 67 ug/mL (ref 50.0–100.0)

## 2023-02-25 NOTE — Patient Instructions (Addendum)
It was so nice to see you today. Thank you so much for coming in.    We know that you are not fused at C3-C4 and C6-C7. This is likely causing most of your pain.   I want to get an MRI of your cervical spine to look into things further. We will get this approved through your insurance and [facility] will call you to schedule the appointment.   Once I get the MRI results (this can take 5-7 days after you have it done), then we will call to schedule a phone visit.   Let me know if/when you can get back to physical therapy and I will reorder it.   Please do not hesitate to call if you have any questions or concerns. You can also message me in MyChart.   Drake Leach PA-C [phone]

## 2023-02-26 ENCOUNTER — Telehealth: Payer: Self-pay

## 2023-02-26 NOTE — Telephone Encounter (Signed)
pt was given test results and advised her to make a follow up appt with her pcp. pt also states that she needs a refill on the depakote she is almost out. please send to [facility] on [address].

## 2023-02-26 NOTE — Telephone Encounter (Signed)
-----   Message from [name], MD sent at 02/26/2023  8:07 AM EDT ----- Please contact the patient regarding the blood test results.  -The TSH level is higher than the normal range, and we have added free T4 for further evaluation. I recommend that she follows up on this matter with her primary care provider.  -Glucose is higher than the normal range if it was taken as fasting.  -Creatinine is slightly higher than the normal range. I recommend she continues to follow up with her primary care provider.  -All other lab results are within the acceptable range, and I recommend she continues the current dose of Depakote.

## 2023-02-26 NOTE — Telephone Encounter (Signed)
She should have enough meds to last until Oct. Please verify below with the pharmacy.    Disp Refills Start End  DULoxetine (CYMBALTA) 60 MG capsule 90 capsule 1 12/26/2022 06/24/2023  Sig - Route: Take 1 capsule (60 mg total) by mouth daily. - Oral  Sent to pharmacy as: DULoxetine (CYMBALTA) 60 MG capsule  E-Prescribing Status: Receipt confirmed by pharmacy (12/26/2022  2:23 PM EDT)

## 2023-02-26 NOTE — Progress Notes (Signed)
Please contact the patient regarding the blood test results.  -The TSH level is higher than the normal range, and we have added free T4 for further evaluation. I recommend that she follows up on this matter with her primary care provider.  -Glucose is higher than the normal range if it was taken as fasting.  -Creatinine is slightly higher than the normal range. I recommend she continues to follow up with her primary care provider.  -All other lab results are within the acceptable range, and I recommend she continues the current dose of Depakote.

## 2023-02-28 ENCOUNTER — Other Ambulatory Visit: Payer: Self-pay | Admitting: Psychiatry

## 2023-02-28 MED ORDER — DIVALPROEX SODIUM ER 500 MG PO TB24: 1500.0000 mg | ORAL_TABLET | Freq: Every day | ORAL | 0 refills | Status: DC

## 2023-02-28 NOTE — Telephone Encounter (Signed)
Got it, thank you. It is ordered to the pharmacy.

## 2023-02-28 NOTE — Telephone Encounter (Signed)
It was not the duloxetine it was the depakote

## 2023-02-28 NOTE — Telephone Encounter (Signed)
Left message that rx was sent to the pharmay  

## 2023-03-04 NOTE — Progress Notes (Signed)
Spoke to patient she will follow up with her PCP
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
## 2023-03-14 ENCOUNTER — Telehealth (INDEPENDENT_AMBULATORY_CARE_PROVIDER_SITE_OTHER): Payer: Medicare Other | Admitting: Psychiatry

## 2023-03-14 ENCOUNTER — Encounter: Payer: Self-pay | Admitting: Psychiatry

## 2023-03-14 DIAGNOSIS — F3181 Bipolar II disorder: Secondary | ICD-10-CM | POA: Diagnosis not present

## 2023-03-14 DIAGNOSIS — G4700 Insomnia, unspecified: Secondary | ICD-10-CM | POA: Diagnosis not present

## 2023-03-14 DIAGNOSIS — F4310 Post-traumatic stress disorder, unspecified: Secondary | ICD-10-CM

## 2023-03-14 DIAGNOSIS — F411 Generalized anxiety disorder: Secondary | ICD-10-CM

## 2023-03-19 DIAGNOSIS — M5412 Radiculopathy, cervical region: Secondary | ICD-10-CM

## 2023-03-19 DIAGNOSIS — Z981 Arthrodesis status: Secondary | ICD-10-CM

## 2023-03-20 MED ORDER — GABAPENTIN 600 MG PO TABS: 600.0000 mg | ORAL_TABLET | Freq: Three times a day (TID) | ORAL | 1 refills | Status: DC

## 2023-03-28 ENCOUNTER — Emergency Department: Payer: Medicare Other

## 2023-03-28 ENCOUNTER — Other Ambulatory Visit: Payer: Self-pay

## 2023-03-28 ENCOUNTER — Encounter: Payer: Self-pay | Admitting: Emergency Medicine

## 2023-03-28 ENCOUNTER — Emergency Department
Admission: EM | Admit: 2023-03-28 | Discharge: 2023-03-28 | Disposition: A | Discharge status: Home / Self Care | Payer: Medicare Other | Attending: Emergency Medicine | Admitting: Emergency Medicine

## 2023-03-28 DIAGNOSIS — S99912A Unspecified injury of left ankle, initial encounter: Secondary | ICD-10-CM

## 2023-03-28 DIAGNOSIS — S6991XA Unspecified injury of right wrist, hand and finger(s), initial encounter: Secondary | ICD-10-CM | POA: Diagnosis not present

## 2023-03-28 DIAGNOSIS — W010XXA Fall on same level from slipping, tripping and stumbling without subsequent striking against object, initial encounter: Secondary | ICD-10-CM | POA: Diagnosis not present

## 2023-03-28 DIAGNOSIS — S4991XA Unspecified injury of right shoulder and upper arm, initial encounter: Secondary | ICD-10-CM | POA: Diagnosis not present

## 2023-03-28 DIAGNOSIS — S8991XA Unspecified injury of right lower leg, initial encounter: Secondary | ICD-10-CM

## 2023-03-28 DIAGNOSIS — W19XXXA Unspecified fall, initial encounter: Secondary | ICD-10-CM

## 2023-03-28 MED ORDER — ACETAMINOPHEN 500 MG PO TABS
1000.0000 mg | ORAL_TABLET | Freq: Once | ORAL | Status: AC
  Administered 2023-03-28: 1000 mg via ORAL
  Filled 2023-03-28: qty 2

## 2023-03-28 MED ORDER — LIDOCAINE 5 % EX PTCH
1.0000 | MEDICATED_PATCH | CUTANEOUS | Status: DC
  Administered 2023-03-28: 1 via TRANSDERMAL
  Filled 2023-03-28: qty 1

## 2023-03-28 NOTE — ED Triage Notes (Signed)
Patient to ED via [facility] after a fall. Patient states she was walking and twisted her left ankle which made her fall. C/o pain of left ankle, right knee, right collar bone and neck. Patient states the neck pain is chronic and is suppose to get MRI of neck soon.

## 2023-03-28 NOTE — ED Provider Notes (Signed)
[facility] Provider Note    Event Date/Time   First MD Initiated Contact with Patient 03/28/23 1503     (approximate)   History   Fall   HPI  Mercedes Forbes is a 52 y.o. female   Past medical history of absence use, bipolar and schizophrenia, arthritis who presents emergency department with a slip and fall earlier today when she rolled her left ankle and fell onto her right side.  No head strike or loss of consciousness does not take blood thinners.  She has right shoulder pain, right wrist and hand pain, left ankle and right knee pain.  She is ambulatory.  No other acute medical complaints.      Physical Exam   Triage Vital Signs: ED Triage Vitals  Encounter Vitals Group     BP 03/28/23 1400 92/65     Systolic BP Percentile --      Diastolic BP Percentile --      Pulse Rate 03/28/23 1400 71     Resp 03/28/23 1400 18     Temp 03/28/23 1400 98.3 F (36.8 C)     Temp Source 03/28/23 1400 Oral     SpO2 03/28/23 1400 99 %     Weight --      Height --      Head Circumference --      Peak Flow --      Pain Score 03/28/23 1401 9     Pain Loc --      Pain Education --      Exclude from Growth Chart --     Most recent vital signs: Vitals:   03/28/23 1400  BP: 92/65  Pulse: 71  Resp: 18  Temp: 98.3 F (36.8 C)  SpO2: 99%    General: Awake, no distress.  CV:  Good peripheral perfusion.  Resp:  Normal effort.  Abd:  No distention.  Other:  She has tenderness to the dorsum of the right hand, right wrist (+snuffbox tenderness) , right knee, and left ankle.  She has full active range of motion to all affected joints.  She complains of right shoulder pain but there is no obvious deformity she is able to range.  She is neurovascular intact to all extremities.  No signs of head trauma, neck is supple with full range of motion.  Thorax, back, abdomen without signs of trauma or tenderness to palpation.   ED Results / Procedures / Treatments    Labs (all labs ordered are listed, but only abnormal results are displayed) Labs Reviewed - No data to display  RADIOLOGY I independently reviewed and interpreted x-ray of the knee and I see no obvious fracture or dislocation   PROCEDURES:  Critical Care performed: No  Procedures   MEDICATIONS ORDERED IN ED: Medications  lidocaine (LIDODERM) 5 % 1 patch (1 patch Transdermal Patch Applied 03/28/23 1527)  acetaminophen (TYLENOL) tablet 1,000 mg (1,000 mg Oral Given 03/28/23 1527)   IMPRESSION / MDM / ASSESSMENT AND PLAN / ED COURSE  I reviewed the triage vital signs and the nursing notes.                                Patient's presentation is most consistent with acute presentation with potential threat to life or bodily function.  Differential diagnosis includes, but is not limited to, chemical slip and fall leading to blunt traumatic injury including right upper extremity  fractures dislocations, knee fracture dislocation, ankle fracture dislocation, sprains.   MDM:    Mechanical slip and fall with injuries to the right upper extremity, right lower extremity and left ankle plain films ordered to assess for fractures or dislocations.  She denies head strike or loss of consciousness, not on blood thinners will defer advanced imaging of the head and neck given atraumatic there.  She is ambulatory and has full active range of motion.  Will apply wrist splint to the right wrist even if x-ray is negative given snuffbox tenderness and have her follow-up with orthopedics.   Pending XR results anticipate dc.         FINAL CLINICAL IMPRESSION(S) / ED DIAGNOSES   Final diagnoses:  Fall, initial encounter  Left ankle injury, initial encounter  Right knee injury, initial encounter  Right wrist injury, initial encounter  Right shoulder injury, initial encounter     Rx / DC Orders   ED Discharge Orders     None        Note:  This document was prepared using Dragon  voice recognition software and may include unintentional dictation errors.    Pilar Jarvis, MD 03/28/23 [phone]

## 2023-03-28 NOTE — ED Triage Notes (Signed)
First nurse note: Arrived by EMS from home for fall. C/o Left and right ankle pain along with neck and back pain. Stood and pivoted to get in wheelchair here at ER. A&O x4 upon arrival

## 2023-03-28 NOTE — Discharge Instructions (Addendum)
Keep Right wrist in the splint and follow-up with Dr. Jamelle Rushing of orthopedics if you continue have pain in the next 3 to 5 days.  Take Tylenol 650 g every 6 hours as needed for pain.

## 2023-04-05 MED ORDER — DIAZEPAM 5 MG PO TABS: ORAL_TABLET | ORAL | 0 refills | Status: DC

## 2023-04-05 NOTE — Addendum Note (Signed)
Addended byDrake Leach on: 04/05/2023 01:27 PM   Modules accepted: Orders

## 2023-04-08 ENCOUNTER — Ambulatory Visit
Admission: RE | Admit: 2023-04-08 | Discharge: 2023-04-08 | Disposition: A | Discharge status: Home / Self Care | Payer: Medicare Other | Source: Ambulatory Visit | Attending: Orthopedic Surgery | Admitting: Orthopedic Surgery

## 2023-04-08 DIAGNOSIS — S129XXS Fracture of neck, unspecified, sequela: Secondary | ICD-10-CM | POA: Insufficient documentation

## 2023-04-08 DIAGNOSIS — Z981 Arthrodesis status: Secondary | ICD-10-CM | POA: Insufficient documentation

## 2023-04-08 DIAGNOSIS — M542 Cervicalgia: Secondary | ICD-10-CM | POA: Diagnosis present

## 2023-04-08 DIAGNOSIS — M5412 Radiculopathy, cervical region: Secondary | ICD-10-CM | POA: Insufficient documentation

## 2023-04-24 ENCOUNTER — Telehealth: Payer: Self-pay

## 2023-04-24 ENCOUNTER — Other Ambulatory Visit: Payer: Self-pay | Admitting: Psychiatry

## 2023-04-24 MED ORDER — ARIPIPRAZOLE 5 MG PO TABS: 5.0000 mg | ORAL_TABLET | Freq: Every day | ORAL | 0 refills | Status: DC

## 2023-04-24 NOTE — Telephone Encounter (Signed)
Patient called to request a refill for the following medication she is also wanting it to be a 90 day supply patient also mention that the dosage was increased to 5 mg please advise Last visit 03/14/23 Next visit 05/07/23   ARIPiprazole (ABILIFY) 2 MG tablet

## 2023-04-24 NOTE — Telephone Encounter (Signed)
Ordered

## 2023-05-05 NOTE — Progress Notes (Unsigned)
No Show

## 2023-05-07 ENCOUNTER — Ambulatory Visit (INDEPENDENT_AMBULATORY_CARE_PROVIDER_SITE_OTHER): Payer: Medicare Other | Admitting: Psychiatry

## 2023-05-07 DIAGNOSIS — Z91199 Patient's noncompliance with other medical treatment and regimen due to unspecified reason: Secondary | ICD-10-CM

## 2023-05-08 NOTE — Progress Notes (Deleted)
Telephone Visit- Progress Note: Referring Physician:  Lorn Junes, FNP [address] [place],  [place] [address]  Primary Physician:  Lorn Junes, FNP  This visit was performed via telephone.  Patient location: home Provider location: office  I spent a total of *** minutes non-face-to-face activities for this visit on the date of this encounter including review of current clinical condition and response to treatment.   Phone visit scheduled for today. I called her and she did not answer. I left a message to call back to reschedule her visit.   I did not speak to patient and phone visit was not done.    Patient has given verbal consent to this telephone visits and we reviewed the limitations of a telephone visit. Patient wishes to proceed.    Chief Complaint:  follow up.   History of Present Illness: Jerzy Spitzley is a 52 y.o. female has a history of bipolar, migraines, schizophrenia, hypothyroidism, and history of cocaine abuse.   She is s/p ACDF C3-C7 by Dr. Myer Haff on 08/06/18. She had initial improvement, but then pain returned in right arm. Previous CT scan showed that she did not appear to be fused at C3-C4 and C6-C7. This was seen on previous imaging as well.   Phone visit to review her cervical MRI scan.    Look at cervical MRI***     Last seen by me on 11/02/22 for pain that got worse after a fall in October of 2023. She does not appear to be fused at C3-C4 and C6-C7. This was seen on previous imaging.    She was sent to PT at her last visit and was only able to go to 2 visits due to transportation and financial issues.    She continues with constant right sided neck pain that radiates down arm to right hand. She also has pain that wraps under her breast and around her back to between her shoulder blades. She has intermittent numbness/tingling in both hands. She notes weakness in her right hand- she feels like this is getting worse.    She is  dropping things. No balance issues.    She stopped smoking in August 2022.    Bowel/Bladder Dysfunction: none   Conservative measures:  Physical therapy: initial eval on 11/26/22 and 1 session on 12/03/22 Multimodal medical therapy including regular antiinflammatories: neurontin  Injections: No recent epidural steroid injections   Past Surgery: ACDF C3-C7 by Dr. Myer Haff on 08/06/18   [name] has  no symptoms of cervical myelopathy.   The symptoms are causing a significant impact on the patient's life.    Exam: No exam done as this was a telephone encounter.     Imaging: MRI of cervical spine dated 04/08/23:  FINDINGS: Alignment: Chronic straightening of the normal cervical lordosis. No significant listhesis.   Vertebrae: No fracture. Prior C3-C7 ACDF. Unchanged hemangioma in the T3 vertebral body. A STIR hyperintense lesion in the right C7 transverse process is also unchanged and also likely benign, such as an atypical hemangioma. Degenerative endplate changes at the cervicothoracic junction and throughout the included upper thoracic spine. Bilateral atlantooccipital joint effusions.   Cord: Normal signal.   Posterior Fossa, vertebral arteries, paraspinal tissues: Unremarkable.   Disc levels:   C2-3: Right uncovertebral spurring and moderate to severe right and mild-to-moderate left facet arthrosis result in mild-to-moderate right neural foraminal stenosis, unchanged from the prior MRI. No spinal stenosis.   C3-4: ACDF. Diffuse spurring and severe right  and mild left facet arthrosis result in mild spinal stenosis and severe right and moderate left neural foraminal stenosis, similar to the prior MRI.   C4-5: ACDF. Patent spinal canal and left neural foramen. Unchanged mild right neural foraminal stenosis due to spurring.   C5-6: ACDF. Patent spinal canal. Unchanged mild osseous neural foraminal narrowing bilaterally.   C6-7: ACDF. Patent spinal canal.  Unchanged mild osseous neural foraminal narrowing bilaterally.   C7-T1: Mild disc bulging, uncovertebral spurring, and moderate facet arthrosis result in mild bilateral neural foraminal stenosis without spinal stenosis, unchanged.   T1-2: Disc bulging and mild facet hypertrophy result in moderate to severe right and moderate left neural foraminal stenosis, stable to mildly progressed from the prior MRI. No spinal stenosis.   T2-3: Disc bulging and mild facet hypertrophy result in moderate to severe right and mild-to-moderate left neural foraminal stenosis without spinal stenosis, unchanged.   IMPRESSION: 1. C3-C7 ACDF with unchanged mild spinal stenosis and severe right and moderate left neural foraminal stenosis at C3-4. 2. Moderate to severe neural foraminal stenosis at T1-2 and T2-3, stable to mildly progressed at T1-2. 3. Unchanged neural foraminal stenosis at C2-3 and C7-T1.     Electronically Signed   By: Sebastian Ache M.D.   On: 04/19/2023 15:56  I have personally reviewed the images and agree with the above interpretation.  Assessment and Plan: Ms. Ventrice is a pleasant 52 y.o. female is s/p ACDF C3-C7 by Dr. Myer Haff on 08/06/18. She had initial improvement, but then pain returned in right arm after fall in October of 2023.    She continues with constant right sided neck pain that radiates down arm to right hand. She also has a new pain that wraps under her breast and around her back to between her shoulder blades. She notes weakness in her right hand- she feels like this is getting worse.    Previous CT scan showed that she did not appear to be fused at C3-C4 and C6-C7. This was seen on previous imaging as well.     Treatment options discussed with patient and following plan made:    - MRI of cervical spine to further evaluate right cervical radiculopathy.  - Okay to continue on neurontin 600mg  tid.  - She is trying to get her car fixed and may be able to revisit PT in  July. She will let me know. Discussed that she will need to complete PT prior to consideration of any further surgery.  - Of note, she quit smoking in [place] of 2022.  - Once I have MRI results back, will set up phone visit with her to review them.   Treatment options discussed with patient and following plan made:   - Order for physical therapy for *** spine ***. Patient to call to schedule appointment. *** - Continue current medications including ***. Reviewed dosing and side effects.  - Prescription for ***. Reviewed dosing and side effects. Take with food.  - Prescription for *** to take prn muscle spasms. Reviewed dosing and side effects. Discussed this can cause drowsiness.  - MRI of *** to further evaluate *** radiculopathy. No improvement time or medications (***).  - Referral to PMR at [facility] to discuss possible *** injections.  - Will schedule phone visit to review MRI results once I get them back.   Drake Leach PA-C Neurosurgery

## 2023-05-09 ENCOUNTER — Telehealth: Payer: Medicare Other | Admitting: Orthopedic Surgery

## 2023-05-09 ENCOUNTER — Encounter: Payer: Self-pay | Admitting: Orthopedic Surgery

## 2023-05-09 NOTE — Progress Notes (Signed)
Phone visit scheduled for today. I called her and she did not answer. I left a message to call back to reschedule her visit.   I did not speak to patient and phone visit was not done.

## 2023-05-13 ENCOUNTER — Other Ambulatory Visit: Payer: Self-pay | Admitting: Psychiatry

## 2023-05-13 NOTE — Telephone Encounter (Signed)
Spoke to patient she stated that she has been taking the Abilify 5 mg for about a month and she stated that she is doing well with it and she does not need a refill right now. Patient does have an appointment for 05/14/23

## 2023-05-13 NOTE — Telephone Encounter (Signed)
Could you ask her if she is willing to try the Abilify 5 mg tablet, or if she prefers to return to the 2 mg per day dose? Although the plan was to have her use the 2 mg tablet to reach a total of 4 mg per day, she missed the last appointment, and I'm not sure which dose she prefers.Please also advise her to have a follow up appointment.
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
## 2023-05-13 NOTE — Telephone Encounter (Signed)
 The refill has been ordered as requested. Please contact the patient to schedule a follow-up visit.

## 2023-05-13 NOTE — Telephone Encounter (Signed)
Virtual appointment scheduled for 05/14/23

## 2023-05-14 ENCOUNTER — Telehealth: Payer: Medicare Other | Admitting: Psychiatry

## 2023-05-14 ENCOUNTER — Encounter: Payer: Self-pay | Admitting: Psychiatry

## 2023-05-14 DIAGNOSIS — F3181 Bipolar II disorder: Secondary | ICD-10-CM

## 2023-05-14 DIAGNOSIS — F4310 Post-traumatic stress disorder, unspecified: Secondary | ICD-10-CM | POA: Diagnosis not present

## 2023-05-14 DIAGNOSIS — G4700 Insomnia, unspecified: Secondary | ICD-10-CM

## 2023-05-14 DIAGNOSIS — F411 Generalized anxiety disorder: Secondary | ICD-10-CM | POA: Diagnosis not present

## 2023-05-14 MED ORDER — DIVALPROEX SODIUM ER 500 MG PO TB24: 1500.0000 mg | ORAL_TABLET | Freq: Every day | ORAL | 0 refills | Status: DC

## 2023-05-14 NOTE — Patient Instructions (Signed)
Continue duloxetine 60 mg daily  Continue Abilify 4 mg at night.  Continue Depakote 1500 mg at night Continue prazosin 2 mg at night  Next appointment: 10/15 at 10 am

## 2023-05-17 ENCOUNTER — Encounter: Payer: Self-pay | Admitting: Orthopedic Surgery

## 2023-05-17 ENCOUNTER — Ambulatory Visit: Payer: Medicare Other | Admitting: Orthopedic Surgery

## 2023-05-17 DIAGNOSIS — M542 Cervicalgia: Secondary | ICD-10-CM

## 2023-05-17 DIAGNOSIS — M4804 Spinal stenosis, thoracic region: Secondary | ICD-10-CM | POA: Diagnosis not present

## 2023-05-17 DIAGNOSIS — M4802 Spinal stenosis, cervical region: Secondary | ICD-10-CM | POA: Diagnosis not present

## 2023-05-17 DIAGNOSIS — M5412 Radiculopathy, cervical region: Secondary | ICD-10-CM | POA: Diagnosis not present

## 2023-05-17 DIAGNOSIS — Z981 Arthrodesis status: Secondary | ICD-10-CM

## 2023-05-17 MED ORDER — GABAPENTIN 600 MG PO TABS
600.0000 mg | ORAL_TABLET | Freq: Three times a day (TID) | ORAL | 1 refills | Status: DC
Start: 2023-05-17 — End: 2023-07-18

## 2023-05-17 NOTE — Progress Notes (Signed)
Telephone Visit- Progress Note: Referring Physician:  No referring provider defined for this encounter.  Primary Physician:  Lorn Junes, FNP  This visit was performed via telephone.  Patient location: home Provider location: office  I spent a total of 10 minutes non-face-to-face activities for this visit on the date of this encounter including review of current clinical condition and response to treatment.   Patient has given verbal consent to this telephone visits and we reviewed the limitations of a telephone visit. Patient wishes to proceed.    Chief Complaint:  follow up.   History of Present Illness: Mercedes Forbes is a 52 y.o. female has a history of bipolar, migraines, schizophrenia, hypothyroidism, and history of cocaine abuse.   She is s/p ACDF C3-C7 by Dr. Myer Haff on 08/06/18. She had initial improvement, but then pain returned in right arm. Previous CT scan showed that she did not appear to be fused at C3-C4 and C6-C7. This was seen on previous imaging as well.   Phone visit to review her cervical MRI scan.   She continues with constant right sided neck pain that radiates down arm to right hand. She has numbness and tinging in right > left hand along with weakness. She notes some right sided shoulder pain with pain at her collarbone and in her shoulder blade.    She is dropping things. No balance issues.   She continues on neurontin. Has not been able to do PT as she does not have a working car. She has no transportation.    She stopped smoking in August 2022.    Conservative measures:  Physical therapy: initial eval on 11/26/22 and 1 session on 12/03/22 Multimodal medical therapy including regular antiinflammatories: neurontin  Injections: No recent epidural steroid injections   Past Surgery: ACDF C3-C7 by Dr. Myer Haff on 08/06/18   Mercedes Forbes has  no symptoms of cervical myelopathy.   The symptoms are causing a significant impact on the  patient's life.    Exam: No exam done as this was a telephone encounter.     Imaging: MRI of cervical spine dated 04/08/23:  FINDINGS: Alignment: Chronic straightening of the normal cervical lordosis. No significant listhesis.   Vertebrae: No fracture. Prior C3-C7 ACDF. Unchanged hemangioma in the T3 vertebral body. A STIR hyperintense lesion in the right C7 transverse process is also unchanged and also likely benign, such as an atypical hemangioma. Degenerative endplate changes at the cervicothoracic junction and throughout the included upper thoracic spine. Bilateral atlantooccipital joint effusions.   Cord: Normal signal.   Posterior Fossa, vertebral arteries, paraspinal tissues: Unremarkable.   Disc levels:   C2-3: Right uncovertebral spurring and moderate to severe right and mild-to-moderate left facet arthrosis result in mild-to-moderate right neural foraminal stenosis, unchanged from the prior MRI. No spinal stenosis.   C3-4: ACDF. Diffuse spurring and severe right and mild left facet arthrosis result in mild spinal stenosis and severe right and moderate left neural foraminal stenosis, similar to the prior MRI.   C4-5: ACDF. Patent spinal canal and left neural foramen. Unchanged mild right neural foraminal stenosis due to spurring.   C5-6: ACDF. Patent spinal canal. Unchanged mild osseous neural foraminal narrowing bilaterally.   C6-7: ACDF. Patent spinal canal. Unchanged mild osseous neural foraminal narrowing bilaterally.   C7-T1: Mild disc bulging, uncovertebral spurring, and moderate facet arthrosis result in mild bilateral neural foraminal stenosis without spinal stenosis, unchanged.   T1-2: Disc bulging and mild facet hypertrophy result in moderate to severe  right and moderate left neural foraminal stenosis, stable to mildly progressed from the prior MRI. No spinal stenosis.   T2-3: Disc bulging and mild facet hypertrophy result in moderate to severe  right and mild-to-moderate left neural foraminal stenosis without spinal stenosis, unchanged.   IMPRESSION: 1. C3-C7 ACDF with unchanged mild spinal stenosis and severe right and moderate left neural foraminal stenosis at C3-4. 2. Moderate to severe neural foraminal stenosis at T1-2 and T2-3, stable to mildly progressed at T1-2. 3. Unchanged neural foraminal stenosis at C2-3 and C7-T1.     Electronically Signed   By: Sebastian Ache M.D.   On: 04/19/2023 15:56  I have personally reviewed the images and agree with the above interpretation.  Assessment and Plan: [name] is a pleasant 52 y.o. female is s/p ACDF C3-C7 by Dr. Myer Haff on 08/06/18. She had initial improvement, but then pain returned in right arm after fall in October of 2023.    She continues with constant right sided neck pain that radiates down arm to right hand. She has numbness and tinging in right > left hand along with weakness. She notes some right sided shoulder pain with pain at her collarbone and in her shoulder blade.    Previous CT scan showed that she did not appear to be fused at C3-C4 and C6-C7. This was seen on previous imaging as well.    MRI shows severe right/moderate left foraminal stenosis C3-C4 along with severe right/moderate left foraminal stenosis T1-T2 and T2-T3.    Treatment options discussed with patient and following plan made:    - Okay to continue on neurontin 600mg  tid. Refill sent to pharmacy.  - She is currently homebound as she as no car or other means of transportation. Will order [facility].  - Discussed that she will need to complete PT prior to consideration of any further surgery.  - Of note, she quit smoking in [place] of 2022.  - Will message her in 3-4 weeks to regroup. Can set up follow up with Dr. Myer Haff once she has completed PT.   Drake Leach PA-C Neurosurgery

## 2023-05-27 ENCOUNTER — Telehealth: Payer: Self-pay | Admitting: Orthopedic Surgery

## 2023-05-27 NOTE — Telephone Encounter (Signed)
Fax from [facility] received with concerns that her pulse was 115. Scanned under media.   Please call patient and make sure she is feeling okay. If she has any dizziness, shortness of breath, feelings like her heart is racing, or chest pain then she should go to the ED.   If she has a blood pressure cuff at home or some way to check her pulse, she should also recheck it. If it remains over 100 beats per minute then she should call her PCP.

## 2023-05-27 NOTE — Telephone Encounter (Signed)
Spoke to patient and she states she has not had any dizziness or shortness of breath, feelings like her heart is racing, or chest pain. She was able to check her heart rate while on the phone with Apple watch and it was 87. Patient states she feeling fine.

## 2023-05-27 NOTE — Telephone Encounter (Signed)
-----   Message from Patrisia C sent at 05/27/2023  1:27 PM EDT ----- This was sent as Urgent Review, you referred her to [facility].

## 2023-06-08 ENCOUNTER — Other Ambulatory Visit: Payer: Self-pay | Admitting: Psychiatry

## 2023-06-10 ENCOUNTER — Other Ambulatory Visit: Payer: Self-pay | Admitting: Family Medicine

## 2023-06-10 DIAGNOSIS — Z1231 Encounter for screening mammogram for malignant neoplasm of breast: Secondary | ICD-10-CM

## 2023-06-18 NOTE — Telephone Encounter (Signed)
Can we find out where she is doing PT- I think it's [facility] and get a copy of her last PT notes?   Need to see how long she has been going and if she has been discharged.   Please let me know.

## 2023-06-18 NOTE — Telephone Encounter (Signed)
If she has been discharged from [facility] then you can schedule her a follow up with Dr. Myer Haff. I would do a 30 minute slot.

## 2023-06-19 NOTE — Telephone Encounter (Signed)
Patient states that she has not been discharged yet. She had to cancel her visit today because she is not feeling well. She will notify the office when she is discharged.

## 2023-06-28 NOTE — Progress Notes (Unsigned)
No show

## 2023-07-02 ENCOUNTER — Ambulatory Visit (INDEPENDENT_AMBULATORY_CARE_PROVIDER_SITE_OTHER): Payer: Medicare Other | Admitting: Psychiatry

## 2023-07-02 DIAGNOSIS — Z91199 Patient's noncompliance with other medical treatment and regimen due to unspecified reason: Secondary | ICD-10-CM

## 2023-07-17 DIAGNOSIS — M5412 Radiculopathy, cervical region: Secondary | ICD-10-CM

## 2023-07-17 DIAGNOSIS — Z981 Arthrodesis status: Secondary | ICD-10-CM

## 2023-07-18 ENCOUNTER — Ambulatory Visit: Payer: Medicare Other | Admitting: Neurosurgery

## 2023-07-18 MED ORDER — GABAPENTIN 600 MG PO TABS
600.0000 mg | ORAL_TABLET | Freq: Three times a day (TID) | ORAL | 1 refills | Status: DC
Start: 2023-07-18 — End: 2023-08-14

## 2023-07-22 NOTE — Progress Notes (Unsigned)
Referring Physician:  Lorn Junes, FNP [address] [place],  [place] [address]  Primary Physician:  Mercedes Junes, FNP  History of Present Illness: 07/23/2023 [name] is here today with a chief complaint of neck pain.  She underwent ACDF C3-7 in 2019.  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  She has continued neck pain.  She has some pain to her right shoulder.  She has been discharged from physical therapy.  Her neck pain is causing her substantial difficulty.    Progress Note from Mercedes Forbes, [place] on 05/17/23:  History of Present Illness: Mercedes Forbes is a 52 y.o. female has a history of bipolar, migraines, schizophrenia, hypothyroidism, and history of cocaine abuse.   She is s/p ACDF C3-C7 by Dr. Myer Haff on 08/06/18. She had initial improvement, but then pain returned in right arm. Previous CT scan showed that she did not appear to be fused at C3-C4 and C6-C7. This was seen on previous imaging as well.    Phone visit to review her cervical MRI scan.    She continues with constant right sided neck pain that radiates down arm to right hand. She has numbness and tinging in right > left hand along with weakness. She notes some right sided shoulder pain with pain at her collarbone and in her shoulder blade.    She is dropping things. No balance issues.    She continues on neurontin. Has not been able to do PT as she does not have a working car. She has no transportation.    She stopped smoking in August 2022.    Conservative measures:  Physical therapy: initial eval on 11/26/22 and 1 session on 12/03/22 Multimodal medical therapy including regular antiinflammatories: neurontin  Injections: No recent epidural steroid injections  Past Surgery: 08/06/18: C3-7 ACDF   Mercedes Forbes has no symptoms of cervical myelopathy.  The symptoms are causing a significant impact on the patient's life.   I have utilized the care everywhere function in  epic to review the outside records available from external health systems.  Review of Systems:  A 10 point review of systems is negative, except for the pertinent positives and negatives detailed in the HPI.  Past Medical History: Past Medical History:  Diagnosis Date   Allergy    Anxiety    Arthritis    Bipolar 1 disorder (HCC)    Depression    GERD (gastroesophageal reflux disease)    Headache    migraines   Personality disorder (HCC)    Polysubstance abuse (HCC)    Schizophrenia (HCC)     Past Surgical History: Past Surgical History:  Procedure Laterality Date   ANTERIOR CERVICAL DECOMPRESSION/DISCECTOMY FUSION 4 LEVELS N/A 08/06/2018   Procedure: ANTERIOR CERVICAL DECOMPRESSION/DISCECTOMY FUSION 4 LEVELS-c3-7;  Surgeon: Mercedes Night, MD;  Location: [facility] ORS;  Service: Neurosurgery;  Laterality: N/A;   KNEE ARTHROSCOPY WITH PATELLA RECONSTRUCTION     left patella removed   wrists Bilateral     Allergies: Allergies as of 07/23/2023 - Review Complete 07/23/2023  Allergen Reaction Noted   Aspirin Other (See Comments) and Shortness Of Breath 06/09/2014   Flurbiprofen Shortness Of Breath 06/09/2014   Nsaids Shortness Of Breath 12/20/2016   Tramadol Itching 10/12/2022    Medications:  Current Outpatient Medications:    ARIPiprazole (ABILIFY) 5 MG tablet, Take 1 tablet (5 mg total) by mouth at bedtime., Disp: 90 tablet, Rfl: 0   divalproex (DEPAKOTE ER) 500 MG  24 hr tablet, Take 3 tablets (1,500 mg total) by mouth daily., Disp: 270 tablet, Rfl: 0   DULoxetine (CYMBALTA) 60 MG capsule, Take 1 capsule (60 mg total) by mouth daily., Disp: 90 capsule, Rfl: 1   gabapentin (NEURONTIN) 600 MG tablet, Take 1 tablet (600 mg total) by mouth 3 (three) times daily., Disp: 90 tablet, Rfl: 1   prazosin (MINIPRESS) 2 MG capsule, Take 1 capsule (2 mg total) by mouth at bedtime., Disp: 90 capsule, Rfl: 1  Social History: Social History   Tobacco Use   Smoking status: Former     Current packs/day: 0.25    Average packs/day: 0.3 packs/day for 2.2 years (0.6 ttl pk-yrs)    Types: Cigarettes    Start date: 05/01/2021   Smokeless tobacco: Never   Tobacco comments:    No e cis, vape, chew  Vaping Use   Vaping status: Never Used  Substance Use Topics   Alcohol use: No   Drug use: Not Currently    Types: Cocaine, Amphetamines, Marijuana    Comment: last 1 month ago    Family Medical History: Family History  Problem Relation Age of Onset   Heart disease Mother    Hyperlipidemia Mother    Hypertension Mother    Kidney disease Mother    Arthritis Mother    Stroke Father    Diabetes Father     Physical Examination: Vitals:   07/23/23 0912  BP: 110/82    General: Patient is in no apparent distress. Attention to examination is appropriate.  Neck:   Supple.  Full range of motion.  Respiratory: Patient is breathing without any difficulty.   NEUROLOGICAL:     Awake, alert, oriented to person, place, and time.  Speech is clear and fluent.   Cranial Nerves: Pupils equal round and reactive to light.  Facial tone is symmetric.  Facial sensation is symmetric. Shoulder shrug is symmetric. Tongue protrusion is midline.  There is no pronator drift.  Strength: Side Biceps Triceps Deltoid Interossei Grip Wrist Ext. Wrist Flex.  R 5 5 5 5 5 5 5   L 5 5 5 5 5 5 5    Side Iliopsoas Quads Hamstring PF DF EHL  R 5 5 5 5 5 5   L 5 5 5 5 5 5    Reflexes are 1+ and symmetric at the biceps, triceps, brachioradialis, patella and achilles.   Hoffman's is absent.   Bilateral upper and lower extremity sensation is intact to light touch.    No evidence of dysmetria noted.  Gait is normal.     Medical Decision Making  Imaging: MRI C spine 04/08/2023 Disc levels:   C2-3: Right uncovertebral spurring and moderate to severe right and mild-to-moderate left facet arthrosis result in mild-to-moderate right neural foraminal stenosis, unchanged from the prior MRI.  No spinal stenosis.   C3-4: ACDF. Diffuse spurring and severe right and mild left facet arthrosis result in mild spinal stenosis and severe right and moderate left neural foraminal stenosis, similar to the prior MRI.   C4-5: ACDF. Patent spinal canal and left neural foramen. Unchanged mild right neural foraminal stenosis due to spurring.   C5-6: ACDF. Patent spinal canal. Unchanged mild osseous neural foraminal narrowing bilaterally.   C6-7: ACDF. Patent spinal canal. Unchanged mild osseous neural foraminal narrowing bilaterally.   C7-T1: Mild disc bulging, uncovertebral spurring, and moderate facet arthrosis result in mild bilateral neural foraminal stenosis without spinal stenosis, unchanged.   T1-2: Disc bulging and mild facet hypertrophy result in  moderate to severe right and moderate left neural foraminal stenosis, stable to mildly progressed from the prior MRI. No spinal stenosis.   T2-3: Disc bulging and mild facet hypertrophy result in moderate to severe right and mild-to-moderate left neural foraminal stenosis without spinal stenosis, unchanged.   IMPRESSION: 1. C3-C7 ACDF with unchanged mild spinal stenosis and severe right and moderate left neural foraminal stenosis at C3-4. 2. Moderate to severe neural foraminal stenosis at T1-2 and T2-3, stable to mildly progressed at T1-2. 3. Unchanged neural foraminal stenosis at C2-3 and C7-T1.     Electronically Signed   By: Sebastian Ache M.D.   On: 04/19/2023 15:56  CT C spine 10/10/2022 FINDINGS: Alignment: Normal.   Skull base and vertebrae: There is no evidence for acute fracture. Anterior fusion plate and disc spacers are seen from C3 through C7 there is no evidence for hardware loosening or acute fracture. No focal osseous lesions are seen.   Soft tissues and spinal canal: No prevertebral fluid or swelling. No visible canal hematoma.   Disc levels: There is disc space narrowing at C7-T1, T1-T2 and T2-T3 with  endplate osteophyte formation compatible with degenerative change. Scattered facet arthropathy is present bilaterally. There is bilateral neural foraminal stenosis at C3-C4, and multilevel mild central canal stenosis throughout the surgical levels which appears similar to the prior study. There has been no significant interval change.   Upper chest: Negative.   Other: None.   IMPRESSION: 1. No acute fracture or traumatic subluxation. 2. Stable postsurgical changes from C3 through C7. 3. Stable degenerative changes.     Electronically Signed   By: Darliss Cheney M.D.   On: 10/10/2022 22:33   Please note that she has evidence of pseudoarthrosis at C3-4 and C6-7.  There is clear lucency around the C3 and C7 screws.  I disagree with the above interpretation.   I have personally reviewed the images and agree with the above interpretation.  Assessment and Plan: Ms. Heiden is a pleasant 52 y.o. female with pseudoarthrosis at C3-4 and C6-7.  She has foraminal stenosis on the right side at C3-4.  She has failed physical therapy.  At this point, I have recommended surgical intervention with posterior spinal fusion at C3-4 and C6-7.  Would instrument between C3 and C7.  We would also perform a right-sided laminoforaminotomy at C3-4.  I discussed with her that this has approximately 50% chance of improving her pain.  I discussed the planned procedure at length with the patient, including the risks, benefits, alternatives, and indications. The risks discussed include but are not limited to bleeding, infection, need for reoperation, spinal fluid leak, stroke, vision loss, anesthetic complication, coma, paralysis, and even death. I also described in detail that improvement was not guaranteed.  The patient expressed understanding of these risks, and asked that we proceed with surgery. I described the surgery in layman's terms, and gave ample opportunity for questions, which were answered to the best  of my ability.  I spent a total of 30 minutes in this patient's care today. This time was spent reviewing pertinent records including imaging studies, obtaining and confirming history, performing a directed evaluation, formulating and discussing my recommendations, and documenting the visit within the medical record.   I discussed with her that I would like to use bone morphogenetic protein given her history of pseudoarthrosis.  After reviewing the risks of use of BMP including seroma and elevated risk of cancer, she agreed that it was okay to proceed with use  of Infuse.    Thank you for involving me in the care of this patient.      Stony Stegmann K. Myer Haff MD, [facility] Neurosurgery

## 2023-07-23 ENCOUNTER — Other Ambulatory Visit: Payer: Self-pay | Admitting: Psychiatry

## 2023-07-23 ENCOUNTER — Ambulatory Visit (INDEPENDENT_AMBULATORY_CARE_PROVIDER_SITE_OTHER): Payer: Medicare Other | Admitting: Neurosurgery

## 2023-07-23 ENCOUNTER — Encounter: Payer: Self-pay | Admitting: Neurosurgery

## 2023-07-23 VITALS — BP 110/82 | Ht 67.0 in | Wt 128.2 lb

## 2023-07-23 DIAGNOSIS — M542 Cervicalgia: Secondary | ICD-10-CM

## 2023-07-23 DIAGNOSIS — M4802 Spinal stenosis, cervical region: Secondary | ICD-10-CM

## 2023-07-23 DIAGNOSIS — M5412 Radiculopathy, cervical region: Secondary | ICD-10-CM

## 2023-07-23 DIAGNOSIS — M960 Pseudarthrosis after fusion or arthrodesis: Secondary | ICD-10-CM

## 2023-07-23 DIAGNOSIS — S129XXS Fracture of neck, unspecified, sequela: Secondary | ICD-10-CM

## 2023-07-23 NOTE — Patient Instructions (Signed)
Please see below for information in regards to your upcoming surgery:   Planned surgery: C3-7 posterior spinal fusion, right C3-4 laminoforaminotomy    Surgery date: 08/19/23 at [facility] (Medical Mall: [address] [place], [place] [address]) - you will find out your arrival time the business day before your surgery.   Pre-op appointment at [facility] Pre-admit Testing: we will call you with a date/time for this. If you are scheduled for an in person appointment, Pre-admit Testing is located on the first floor of the [facility] building, [address], [address]. Please bring all prescriptions in the original prescription bottles to your appointment. During this appointment, they will advise you which medications you can take the morning of surgery, and which medications you will need to hold for surgery. Labs (such as blood work, EKG) may be done at your pre-op appointment. You are not required to fast for these labs. Should you need to change your pre-op appointment, please call Pre-admit testing at [phone].     Surgical clearance: we will send a clearance form to Dr Marvis Moeller. They may wish to see you in their office prior to signing the clearance form. If so, they may call you to schedule an appointment.     NSAIDS (Non-steroidal anti-inflammatory drugs): because you are having a fusion, please avoid taking any NSAIDS (examples: ibuprofen, motrin, aleve, naproxen, meloxicam, diclofenac) for 3 months after surgery. Celebrex is an exception and is OK to take, if prescribed. Tylenol is not an NSAID.    Common restrictions after surgery: No bending, lifting, or twisting ("BLT"). Avoid lifting objects heavier than 10 pounds for the first 6 weeks after surgery. Where possible, avoid household activities that involve lifting, bending, reaching, pushing, or pulling such as laundry, vacuuming, grocery shopping, and childcare. Try to arrange for help  from friends and family for these activities while you heal. Do not drive while taking prescription pain medication. Weeks 6 through 12 after surgery: avoid lifting more than 25 pounds.    X-rays after surgery: Because you are having a fusion: for appointments after your 2 week follow-up: please arrive at the [facility] outpatient imaging center ([address], [address], [place]) or [facility] one hour prior to your appointment for x-rays. This applies to every appointment after your 2 week follow-up. Failure to do so may result in your appointment being rescheduled.   How to contact us:  If you have any questions/concerns before or after surgery, you can reach [place] at [phone], or you can send a mychart message. We can be reached by phone or mychart 8am-4pm, Monday-Friday.  *Please note: Calls after 4pm are forwarded to a third party answering service. Mychart messages are not routinely monitored during evenings, weekends, and holidays. Please call our office to contact the answering service for urgent concerns during non-business hours.     If you have FMLA/disability paperwork, please drop it off or fax it to [phone], attention Patty.   Appointments/FMLA & disability paperwork: Joycelyn Rua, & Flonnie Hailstone Registered Nurse/Surgery scheduler: Royston Cowper Medical Assistants: Nash Mantis Physician Assistants: Manning Charity, PA-C & Drake Leach, PA-C Surgeons: Venetia Night, MD & Ernestine Mcmurray, MD

## 2023-07-23 NOTE — Addendum Note (Signed)
Addended by: Sharlot Gowda on: 07/23/2023 09:59 AM   Modules accepted: Orders

## 2023-07-25 ENCOUNTER — Telehealth: Payer: Self-pay | Admitting: Neurosurgery

## 2023-07-25 ENCOUNTER — Ambulatory Visit: Payer: Medicare Other | Admitting: Neurosurgery

## 2023-07-25 NOTE — Telephone Encounter (Signed)
It will not be done before 08/19/2023.

## 2023-07-25 NOTE — Telephone Encounter (Signed)
Patient is calling to request a later surgery date. She states she is having to have a fence built for her dogs and

## 2023-07-26 ENCOUNTER — Other Ambulatory Visit: Payer: Self-pay

## 2023-07-26 DIAGNOSIS — Z01818 Encounter for other preprocedural examination: Secondary | ICD-10-CM

## 2023-08-09 ENCOUNTER — Telehealth: Payer: Self-pay

## 2023-08-09 NOTE — Telephone Encounter (Signed)
Ms Kontos called in. She has requested to postpone her surgery because the humane society is building a fence for her dogs and it has been delayed until early January. Surgery has been postponed until 10/07/23 per her request.

## 2023-08-12 ENCOUNTER — Inpatient Hospital Stay: Admission: RE | Admit: 2023-08-12 | Payer: Medicare Other | Source: Ambulatory Visit

## 2023-08-14 ENCOUNTER — Other Ambulatory Visit: Payer: Medicare Other

## 2023-08-14 ENCOUNTER — Other Ambulatory Visit: Payer: Self-pay | Admitting: Family Medicine

## 2023-08-14 DIAGNOSIS — M5412 Radiculopathy, cervical region: Secondary | ICD-10-CM

## 2023-08-14 DIAGNOSIS — Z981 Arthrodesis status: Secondary | ICD-10-CM

## 2023-08-14 MED ORDER — GABAPENTIN 600 MG PO TABS: 600.0000 mg | ORAL_TABLET | Freq: Three times a day (TID) | ORAL | 1 refills | Status: AC

## 2023-09-02 ENCOUNTER — Encounter: Payer: Medicare Other | Admitting: Orthopedic Surgery

## 2023-09-19 ENCOUNTER — Telehealth: Payer: Self-pay

## 2023-09-19 NOTE — Telephone Encounter (Signed)
 Ms Grussing sent a Mercedes Forbes message requesting to cancel surgery at this time. I have notified the OR and reps. All corresponding appointments have been canceled.

## 2023-09-24 ENCOUNTER — Inpatient Hospital Stay: Admission: RE | Admit: 2023-09-24 | Payer: Medicare Other | Source: Ambulatory Visit

## 2023-10-03 ENCOUNTER — Encounter: Payer: Medicare Other | Admitting: Neurosurgery

## 2023-10-07 ENCOUNTER — Inpatient Hospital Stay: Admit: 2023-10-07 | Payer: Medicare Other | Admitting: Neurosurgery

## 2023-10-07 SURGERY — POSTERIOR CERVICAL FUSION/FORAMINOTOMY LEVEL 4
Anesthesia: General

## 2023-10-22 ENCOUNTER — Encounter: Payer: Medicare Other | Admitting: Orthopedic Surgery

## 2023-11-07 ENCOUNTER — Encounter: Payer: Medicare Other | Admitting: Orthopedic Surgery

## 2023-11-19 ENCOUNTER — Encounter: Payer: Medicare Other | Admitting: Neurosurgery

## 2023-12-26 ENCOUNTER — Encounter: Payer: Medicare Other | Admitting: Orthopedic Surgery

## 2024-05-31 ENCOUNTER — Other Ambulatory Visit: Payer: Self-pay

## 2024-05-31 ENCOUNTER — Observation Stay
Admission: EM | Admit: 2024-05-31 | Discharge: 2024-06-01 | Disposition: A | Discharge status: Home / Self Care | Attending: Internal Medicine | Admitting: Internal Medicine

## 2024-05-31 ENCOUNTER — Emergency Department

## 2024-05-31 DIAGNOSIS — Z87891 Personal history of nicotine dependence: Secondary | ICD-10-CM | POA: Insufficient documentation

## 2024-05-31 DIAGNOSIS — R000 Tachycardia, unspecified: Secondary | ICD-10-CM | POA: Insufficient documentation

## 2024-05-31 DIAGNOSIS — W1830XA Fall on same level, unspecified, initial encounter: Secondary | ICD-10-CM | POA: Insufficient documentation

## 2024-05-31 DIAGNOSIS — F39 Unspecified mood [affective] disorder: Secondary | ICD-10-CM | POA: Insufficient documentation

## 2024-05-31 DIAGNOSIS — R52 Pain, unspecified: Secondary | ICD-10-CM | POA: Insufficient documentation

## 2024-05-31 DIAGNOSIS — Z981 Arthrodesis status: Secondary | ICD-10-CM | POA: Diagnosis not present

## 2024-05-31 DIAGNOSIS — A419 Sepsis, unspecified organism: Secondary | ICD-10-CM | POA: Diagnosis not present

## 2024-05-31 DIAGNOSIS — I129 Hypertensive chronic kidney disease with stage 1 through stage 4 chronic kidney disease, or unspecified chronic kidney disease: Secondary | ICD-10-CM | POA: Diagnosis not present

## 2024-05-31 DIAGNOSIS — K047 Periapical abscess without sinus: Secondary | ICD-10-CM | POA: Diagnosis present

## 2024-05-31 DIAGNOSIS — E039 Hypothyroidism, unspecified: Secondary | ICD-10-CM | POA: Diagnosis not present

## 2024-05-31 DIAGNOSIS — L03211 Cellulitis of face: Secondary | ICD-10-CM | POA: Insufficient documentation

## 2024-05-31 DIAGNOSIS — N1831 Chronic kidney disease, stage 3a: Secondary | ICD-10-CM | POA: Insufficient documentation

## 2024-05-31 DIAGNOSIS — S0083XA Contusion of other part of head, initial encounter: Secondary | ICD-10-CM | POA: Insufficient documentation

## 2024-05-31 DIAGNOSIS — K029 Dental caries, unspecified: Secondary | ICD-10-CM | POA: Insufficient documentation

## 2024-05-31 DIAGNOSIS — Y92009 Unspecified place in unspecified non-institutional (private) residence as the place of occurrence of the external cause: Secondary | ICD-10-CM | POA: Diagnosis not present

## 2024-05-31 DIAGNOSIS — W19XXXA Unspecified fall, initial encounter: Secondary | ICD-10-CM

## 2024-05-31 DIAGNOSIS — L0291 Cutaneous abscess, unspecified: Principal | ICD-10-CM

## 2024-05-31 LAB — BASIC METABOLIC PANEL WITH GFR
Anion gap: 14 (ref 5–15)
BUN: 20 mg/dL (ref 6–20)
CO2: 22 mmol/L (ref 22–32)
Calcium: 9.5 mg/dL (ref 8.9–10.3)
Chloride: 99 mmol/L (ref 98–111)
Creatinine, Ser: 1.1 mg/dL — ABNORMAL HIGH (ref 0.44–1.00)
GFR, Estimated: 59 mL/min — ABNORMAL LOW (ref >60)
Glucose, Bld: 108 mg/dL — ABNORMAL HIGH (ref 70–99)
Potassium: 4.1 mmol/L (ref 3.5–5.1)
Sodium: 135 mmol/L (ref 135–145)

## 2024-05-31 LAB — CBC
HCT: 39.4 % (ref 36.0–46.0)
Hemoglobin: 13 g/dL (ref 12.0–15.0)
MCH: 30 pg (ref 26.0–34.0)
MCHC: 33 g/dL (ref 30.0–36.0)
MCV: 90.8 fL (ref 80.0–100.0)
Platelets: 308 K/uL (ref 150–400)
RBC: 4.34 MIL/uL (ref 3.87–5.11)
RDW: 14.7 % (ref 11.5–15.5)
WBC: 13.4 K/uL — ABNORMAL HIGH (ref 4.0–10.5)
nRBC: 0 % (ref 0.0–0.2)

## 2024-05-31 LAB — MRSA NEXT GEN BY PCR, NASAL: MRSA by PCR Next Gen: DETECTED — AB

## 2024-05-31 LAB — LACTIC ACID, PLASMA: Lactic Acid, Venous: 1.6 mmol/L (ref 0.5–1.9)

## 2024-05-31 LAB — TSH: TSH: 7.602 u[IU]/mL — ABNORMAL HIGH (ref 0.350–4.500)

## 2024-05-31 LAB — HEMOGLOBIN A1C
Hgb A1c MFr Bld: 5.1 % (ref 4.8–5.6)
Mean Plasma Glucose: 99.67 mg/dL

## 2024-05-31 MED ORDER — SODIUM CHLORIDE 0.9 % IV SOLN
3.0000 g | Freq: Once | INTRAVENOUS | Status: AC
  Administered 2024-05-31: 3 g via INTRAVENOUS
  Filled 2024-05-31: qty 8

## 2024-05-31 MED ORDER — DULOXETINE HCL 30 MG PO CPEP
60.0000 mg | ORAL_CAPSULE | Freq: Every day | ORAL | Status: DC
  Administered 2024-06-01: 60 mg via ORAL
  Filled 2024-05-31: qty 2

## 2024-05-31 MED ORDER — ACETAMINOPHEN 325 MG PO TABS
650.0000 mg | ORAL_TABLET | Freq: Three times a day (TID) | ORAL | Status: DC
  Administered 2024-05-31 – 2024-06-01 (×3): 650 mg via ORAL
  Filled 2024-05-31 (×3): qty 2

## 2024-05-31 MED ORDER — ENOXAPARIN SODIUM 40 MG/0.4ML IJ SOSY
40.0000 mg | PREFILLED_SYRINGE | INTRAMUSCULAR | Status: DC
  Administered 2024-05-31: 40 mg via SUBCUTANEOUS
  Filled 2024-05-31: qty 0.4

## 2024-05-31 MED ORDER — HYDROMORPHONE HCL 1 MG/ML IJ SOLN
0.5000 mg | INTRAMUSCULAR | Status: DC | PRN
  Administered 2024-05-31 – 2024-06-01 (×3): 0.5 mg via INTRAVENOUS
  Filled 2024-05-31 (×3): qty 0.5

## 2024-05-31 MED ORDER — VANCOMYCIN HCL 1250 MG/250ML IV SOLN
1250.0000 mg | INTRAVENOUS | Status: DC
  Filled 2024-05-31: qty 250

## 2024-05-31 MED ORDER — BUSPIRONE HCL 10 MG PO TABS
10.0000 mg | ORAL_TABLET | Freq: Two times a day (BID) | ORAL | Status: DC
  Administered 2024-05-31 – 2024-06-01 (×2): 10 mg via ORAL
  Filled 2024-05-31 (×2): qty 1

## 2024-05-31 MED ORDER — MUPIROCIN 2 % EX OINT
1.0000 | TOPICAL_OINTMENT | Freq: Two times a day (BID) | CUTANEOUS | Status: DC
  Filled 2024-05-31: qty 22

## 2024-05-31 MED ORDER — ONDANSETRON HCL 4 MG/2ML IJ SOLN
4.0000 mg | Freq: Once | INTRAMUSCULAR | Status: AC
  Administered 2024-05-31: 4 mg via INTRAVENOUS
  Filled 2024-05-31: qty 2

## 2024-05-31 MED ORDER — VANCOMYCIN HCL 1500 MG/300ML IV SOLN
1500.0000 mg | Freq: Once | INTRAVENOUS | Status: AC
Start: 2024-05-31 — End: 2024-05-31
  Administered 2024-05-31: 1500 mg via INTRAVENOUS
  Filled 2024-05-31: qty 300

## 2024-05-31 MED ORDER — SODIUM CHLORIDE 0.9 % IV BOLUS (SEPSIS)
1000.0000 mL | Freq: Once | INTRAVENOUS | Status: AC
  Administered 2024-05-31: 1000 mL via INTRAVENOUS

## 2024-05-31 MED ORDER — CHLORHEXIDINE GLUCONATE CLOTH 2 % EX PADS: 6.0000 | MEDICATED_PAD | Freq: Every day | CUTANEOUS | Status: DC

## 2024-05-31 MED ORDER — GABAPENTIN 300 MG PO CAPS
600.0000 mg | ORAL_CAPSULE | Freq: Three times a day (TID) | ORAL | Status: DC
Start: 2024-05-31 — End: 2024-06-01
  Administered 2024-05-31 – 2024-06-01 (×3): 600 mg via ORAL
  Filled 2024-05-31 (×3): qty 2

## 2024-05-31 MED ORDER — SODIUM CHLORIDE 0.9 % IV SOLN
3.0000 g | Freq: Four times a day (QID) | INTRAVENOUS | Status: DC
  Administered 2024-05-31 – 2024-06-01 (×3): 3 g via INTRAVENOUS
  Filled 2024-05-31 (×5): qty 8

## 2024-05-31 MED ORDER — SODIUM CHLORIDE 0.9 % IV BOLUS (SEPSIS)
250.0000 mL | Freq: Once | INTRAVENOUS | Status: AC
  Administered 2024-05-31: 250 mL via INTRAVENOUS

## 2024-05-31 MED ORDER — HYDROMORPHONE HCL 1 MG/ML IJ SOLN
0.5000 mg | Freq: Once | INTRAMUSCULAR | Status: AC
  Administered 2024-05-31: 0.5 mg via INTRAVENOUS
  Filled 2024-05-31: qty 0.5

## 2024-05-31 MED ORDER — IOHEXOL 300 MG/ML  SOLN
100.0000 mL | Freq: Once | INTRAMUSCULAR | Status: AC | PRN
  Administered 2024-05-31: 100 mL via INTRAVENOUS

## 2024-05-31 NOTE — Assessment & Plan Note (Signed)
 Mood Disorder Appears euthymic on examination today Bipolar, schizophrenia by hx Continue buspirone  10 mg twice daily and duloxetine  60 mg daily Continue home gabapentin  600 mg 3 times daily

## 2024-05-31 NOTE — Assessment & Plan Note (Signed)
 Tachycardia Likely mediated due to acute pain in the patient's infection Twelve-lead ECG ordered and pending patient will be on telemetry

## 2024-05-31 NOTE — Assessment & Plan Note (Signed)
 Acute pain Secondary to the patient's dental abscess ordered hydromorphone  0.5 mg as needed every 3 hours, patient is allergic to  NSAIDs  Acetaminophen  Schuedled 2 days for multimodal control

## 2024-05-31 NOTE — Plan of Care (Signed)

## 2024-05-31 NOTE — Progress Notes (Signed)
 Elink following for sepsis protocol.

## 2024-05-31 NOTE — Assessment & Plan Note (Signed)
 Hypothyroidism By history obtaining TSH, Home meds for this indication

## 2024-05-31 NOTE — ED Provider Notes (Signed)
 [facility] Provider Note    Event Date/Time   First MD Initiated Contact with Patient 05/31/24 1139     (approximate)   History   Abscess   HPI  Mercedes Forbes is a 53 y.o. female with poor dentition, bipolar , PTSD who comes in with concerns for facial swelling.  Patient's had 4 days of worsening facial swelling.  She reports having a right upper dental pain but today she woke up with significant swelling into her mid face.  She does report some pain with moving her eye.  She also reports that separately she had an accident yesterday where she hit her head and has a hematoma to her forehead and has had some worsening neck pain.  She denies any other injuries.  She denies any known history of fevers but reports having a history of sepsis without fever.   Physical Exam   Triage Vital Signs: ED Triage Vitals  Encounter Vitals Group     BP 05/31/24 1134 (!) 140/109     Girls Systolic BP Percentile --      Girls Diastolic BP Percentile --      Boys Systolic BP Percentile --      Boys Diastolic BP Percentile --      Pulse Rate 05/31/24 1135 (!) 110     Resp 05/31/24 1134 18     Temp 05/31/24 1134 (!) 97.5 F (36.4 C)     Temp src --      SpO2 05/31/24 1135 99 %     Weight 05/31/24 1134 157 lb (71.2 kg)     Height 05/31/24 1134 5' 7 (1.702 m)     Head Circumference --      Peak Flow --      Pain Score 05/31/24 1134 8     Pain Loc --      Pain Education --      Exclude from Growth Chart --     Most recent vital signs: Vitals:   05/31/24 1134 05/31/24 1135  BP: (!) 140/109   Pulse:  (!) 110  Resp: 18   Temp: (!) 97.5 F (36.4 C)   SpO2:  99%     General: Awake, no distress.  CV:  Good peripheral perfusion.  Resp:  Normal effort.  Abd:  No distention.  Other:  Patient has hematoma to the forehead.  There is some redness and warmth noted to the right maxillary area going up underneath the eye.  Full range of motion of eye but she  reports pain with eye movements.  On her right upper dental area she has an area of fluctuation that is currently actively draining.  She reports it started draining on her way here.   ED Results / Procedures / Treatments   Labs (all labs ordered are listed, but only abnormal results are displayed) Labs Reviewed  CBC  BASIC METABOLIC PANEL WITH GFR     RADIOLOGY I have reviewed the ct  personally and interpreted + possible abscess   PROCEDURES:  Critical Care performed: No  .Critical Care  Performed by: Ernest Ronal BRAVO, MD Authorized by: Ernest Ronal BRAVO, MD   Critical care provider statement:    Critical care time (minutes):  30   Critical care was necessary to treat or prevent imminent or life-threatening deterioration of the following conditions:  Sepsis   Critical care was time spent personally by me on the following activities:  Development of treatment plan with patient  or surrogate, discussions with consultants, evaluation of patient's response to treatment, examination of patient, ordering and review of laboratory studies, ordering and review of radiographic studies, ordering and performing treatments and interventions, pulse oximetry, re-evaluation of patient's condition and review of old charts    MEDICATIONS ORDERED IN ED: Medications  Ampicillin -Sulbactam (UNASYN ) 3 g in sodium chloride  0.9 % 100 mL IVPB (3 g Intravenous New Bag/Given 05/31/24 1207)  HYDROmorphone  (DILAUDID ) injection 0.5 mg (0.5 mg Intravenous Given 05/31/24 1225)  ondansetron  (ZOFRAN ) injection 4 mg (4 mg Intravenous Given 05/31/24 1224)     IMPRESSION / MDM / ASSESSMENT AND PLAN / ED COURSE  I reviewed the triage vital signs and the nursing notes.   Patient's presentation is most consistent with acute presentation with potential threat to life or bodily function.   Patient comes in with dental abscess that is currently actively draining.  She reports that it popped on its way over here.  Was able  to feel and there is a decompressed area noted above her right upper tooth.  However given her symptoms we will get CT imaging to ensure no orbital cellulitis.  Discussed with CT who recommended I do the CT face if I want to include the upper mandible as this will still get a image of the orbits as well.  Given she does report also a fall and hitting her head yesterday will get CT imaging of her head and neck to rule out other acute pathology.  Patient given some Dilaudid , Zofran  for pain and started on Unasyn .  Sepsis workup was done given patient's elevated white count and her elevated heart rate  Lactate is normal.  Blood cultures x 2 are pending.  Patient had Unasyn  started due to concern for dental infection.   IMPRESSION: 1. Right supraorbital scalp hematoma without underlying fracture or foreign body. 2. Asymmetric right-sided facial soft tissue swelling, possibly related to trauma or a subperiosteal abscess along the right side of the anterior maxilla. 3. Prominent dental caries with associated subperiosteal abscess along the anterior right paramedian maxilla. Multiple other periapical lucencies are present without associated soft tissue abnormalities.  I went back into evaluate patient to see if we needed to I&D this subperiosteal abscess however when she opened her mouth a ton more drainage came out and with palpation over the area is now nonfluctuant in nature.  Do not feel like anything else that I can even open up at this point I suspect spontaneously draining now for 3 hours.  However given the extension of the swelling into the face we did discuss admission for IV antibiotics and patient feels comfortable with this plan    FINAL CLINICAL IMPRESSION(S) / ED DIAGNOSES   Final diagnoses:  Abscess  Sepsis, due to unspecified organism, unspecified whether acute organ dysfunction present [facility])  Dental abscess  Cellulitis of face     Rx / DC Orders   ED Discharge Orders      None        Note:  This document was prepared using Dragon voice recognition software and may include unintentional dictation errors.   Ernest Ronal BRAVO, MD 05/31/24 1450

## 2024-05-31 NOTE — H&P (Addendum)
 History and Physical    Patient: Mercedes Forbes DOB: Jan 22, 1971 DOA: 05/31/2024 DOS: the patient was seen and examined on 05/31/2024 PCP: Buren Rock HERO, MD  Patient coming from: Home  Chief Complaint:  Chief Complaint  Patient presents with   Abscess   HPI:  53 year old female with a past medical history of Bipolar, schizophrenia, hypothyroidism, cocaine abuse, cervical radiculopathy with s/p ACDF as well as poor dentition presented to urgent care earlier today with painful eye movements on the right side increasing facial swelling on her right face as well as purulent drainage from her upper mouth.  The patient reports the pain started approximately 4 days ago.  The patient reports discharge from her sinuses and was using a Nettie pot at home and walked outside in the melanite to empty it out and then had a fall to her head and face as well primarily to her forehead and reports that she did not have trauma around her right eyebrow her current swelling is.  She denies any visual disturbances.  Past medical records reviewed and summarized: [facility] with facial swelling and dental related abscess today Office visit May 17, 2023: Bipolar, schizophrenia, hypothyroidism, cocaine abuse, cervical radiculopathy with s/p ACDF C3-C7 by Dr. Clois on 08/06/18.  Review of Systems:  Constitutional: Denies Weight Loss Reports Fever, Chills but no Night Sweats Eyes: Denies Blurry Vision Reports Eye Pain  with movements but no  Decreased Vision Cardiovascular: Denies Chest Pain, Paroxsymal Nocturnal Dyspnea, Palpitations, Edema Gastrointestinal: Denies Nausea, Vomiting, Diarrhea, Hematemesis, Melena Genitourinary: Denies hematuria, dysuria, hesitancy, incontinence Musculoskeletal: Denies Arthralgias, Myalgias, Muscle Weakness, Joint Swelling Skin: Reports Swelling and Redness Neurological: Denies Migraines, Numbness, Ataxia, Tremors, Vertigo Endocrine:  Denies Excess Thirst, Polyuria, Cold Intolerance, Heat Intolerance All other systems were reviewed and are negative   Past Medical History:  Diagnosis Date   Allergy    Anxiety    Arthritis    Bipolar 1 disorder (HCC)    Depression    GERD (gastroesophageal reflux disease)    Headache    migraines   Personality disorder (HCC)    Polysubstance abuse (HCC)    Schizophrenia (HCC)    Past Surgical History:  Procedure Laterality Date   ANTERIOR CERVICAL DECOMPRESSION/DISCECTOMY FUSION 4 LEVELS N/A 08/06/2018   Procedure: ANTERIOR CERVICAL DECOMPRESSION/DISCECTOMY FUSION 4 LEVELS-c3-7;  Surgeon: Clois Fret, MD;  Location: [facility] ORS;  Service: Neurosurgery;  Laterality: N/A;   KNEE ARTHROSCOPY WITH PATELLA RECONSTRUCTION     left patella removed   wrists Bilateral    Social History:  reports that she has quit smoking. Her smoking use included cigarettes. She started smoking about 3 years ago. She has a 0.8 pack-year smoking history. She has never used smokeless tobacco. She reports that she does not currently use drugs after having used the following drugs: Cocaine, Amphetamines, and Marijuana. She reports that she does not drink alcohol.  Allergies  Allergen Reactions   Aspirin Other (See Comments) and Shortness Of Breath    Shortness of breath like an asthma attack   Flurbiprofen Shortness Of Breath   Nsaids Shortness Of Breath   Tramadol  Itching    Itching all over the body    Family History  Problem Relation Age of Onset   Heart disease Mother    Hyperlipidemia Mother    Hypertension Mother    Kidney disease Mother    Arthritis Mother    Stroke Father    Diabetes Father     Prior to  Admission medications   Medication Sig Start Date End Date Taking? Authorizing Provider  busPIRone  (BUSPAR ) 10 MG tablet Take 10 mg by mouth 2 (two) times daily. 04/29/24  Yes [provider]  DULoxetine  (CYMBALTA ) 60 MG capsule Take 60 mg by mouth daily. 05/11/24  Yes  [provider]  gabapentin  (NEURONTIN ) 600 MG tablet Take 1 tablet (600 mg total) by mouth 3 (three) times daily. 08/14/23  Yes Ulis Bottcher, PA-C    Physical Exam: Vitals:   05/31/24 1134 05/31/24 1135 05/31/24 1204 05/31/24 1255  BP: (!) 140/109     Pulse:  (!) 110    Resp: 18     Temp: (!) 97.5 F (36.4 C)     SpO2:  99% 99% 99%  Weight: 71.2 kg     Height: 5' 7 (1.702 m)     Patient seen and examined room 46 of the ED Constitutional:  Vital Signs as per Above [facility] than three noted] No Acute Distress ENMT:   Hematoma around forehead region Patient has swelling around the right eye erythema and swelling of the right maxillary region full eye extraocular movements sclera within normal limits has numerous missing teeth almost the full upper teeth missing with free draining pus around the location of the anterior maxillary dentition would be located . Neck:     Trachea Midline, Neck Symmetric             Thyroid  without tenderness, palpable masses or nodules Respiratory:   Respiratory Effort Normal: No Use of Respiratory Muscles,No  Intercostal Retractions             Lungs Clear to Auscultation Bilaterally Cardiovascular:   Heart Auscultated: Regular Regular without any added sounds or murmurs              No Lower Extremity Edema Gastrointestinal:  Abdomen soft and nontender without palpable masses, guarding or rebound  No Palpable Splenomegaly or Hepatomegaly Neurologic:  B/L U/E 4/5 Power (baseline) Can do brudzinski to baseline ROM Psychiatric:  Patient Orientated to Time, Place and Person Patient with appropriate mood and affect Recent and Remote Memory Intact  Data Reviewed: Labs, Radiology, ECG as detailed in HPI and A/P   Assessment and Plan: * Dental abscess Sepsis secondary to dental related abscess Concern for periorbital cellulitis, CT: BRAIN, ORBITS AND SINUSES:No acute abnormality. Sepsis based on Sep 2 criteria based off of the tachycardia  of 110 and WBC of 13.4 and known infection Lactic acid reassuring at 1.6 -> CT: Lucency about the right medialmaxillary sinus has eroded the anterior osseous margin and resulted in a subperiosteal abscess measuring 17 x 20 x 7 mm along the anterior rightparamedian maxilla. Of note she has free draining pus into her mouth degree of self source control Clinically, patient has right hemiface swollen with swelling around the right eye and painful eye movements but full eye range of motion Discussed case at 2:47 PM with ENT on-call Dr. Jessee with imaging findings as well as the clinical presentation and will review for further source control agrees with the Unasyn  monotherapy for now Clinical picture not consistent with necrotizing infection Plan: IV Unasyn  3 g every 6 hours given source is dental in nature (no suspicion disease fungal currently), A1c, ENT consultation for review ?  Further source control need (lucency about the right medialmaxillary sinus).  Follow-up outstanding blood cultures which have been ordered, adding MRSA coverage with vancomycin  until culture directed data has been obtained given the rapid progression of facial  cellulitis, MRSA nares ordered, of note this presentation is not appropriate for the cellulitis order set.  Serial examination for progressive orbital or airway involvement . Needs interval dental follow-up.  Traumatic hematoma of forehead Forehead hematoma Hb 13 Does not require any other intervention other than conservative measures CT head without any intracranial bleed   CKD stage 3a, GFR 45-59 ml/min (HCC) Chronic kidney disease Stage IIIa Creatinine 1.1 with an estimated GFR 59 consistent with 1 year ago We will avoid nephrotoxins keep volume replete  Mood disorder (HCC) Mood Disorder Appears euthymic on examination today Bipolar, schizophrenia by hx Continue buspirone  10 mg twice daily and duloxetine  60 mg daily Continue home gabapentin  600 mg 3  times daily  Fall at home, initial encounter   Fall this occurred due to going outside use a Nettie pot while dark Cannot elicit any focal neurology on examination, denies any increasing back pain: If blood cultures positive will need to explore the solid effusions as well as the fact she has hardware in her neck CT head and CT cervical spine unremarkable although of note the patient has indwelling spinal hardware blood culture positive Patient reports tha1. No acute abnormality of the cervical spine related to the provided clinical history of ataxia and cervical trauma. 2. Solid effusion at C3-4, C4-5, C5-6, and C6-7. 3. Chronic endplate changes at C7-T1. 4. Stable mild right foraminal narrowing at C2-3. CT Head 1. No acute intracranial abnormality. 2. Right supraorbital scalp hematoma without underlying fracture or foreign body.   Tachycardia Tachycardia Likely mediated due to acute pain in the patient's infection Twelve-lead ECG ordered and pending patient will be on telemetry  Acute pain Acute pain Secondary to the patient's dental abscess ordered hydromorphone  0.5 mg as needed every 3 hours, patient is allergic to  NSAIDs  Acetaminophen  Schuedled 2 days for multimodal control  Hypothyroidism Hypothyroidism By history obtaining TSH, Home meds for this indication     Advance Care Planning:   Code Status: Full Code  Full code as per the patient   Consults: ENT  Family Communication: Nil  Severity of Illness: The appropriate patient status for this patient is INPATIENT. Inpatient status is judged to be reasonable and necessary in order to provide the required intensity of service to ensure the patient's safety. The patient's presenting symptoms, physical exam findings, and initial radiographic and laboratory data in the context of their chronic comorbidities is felt to place them at high risk for further clinical deterioration. Furthermore, it is not anticipated that  the patient will be medically stable for discharge from the hospital within 2 midnights of admission.   * I certify that at the point of admission it is my clinical judgment that the patient will require inpatient hospital care spanning beyond 2 midnights from the point of admission due to high intensity of service, high risk for further deterioration and high frequency of surveillance required.*  Patient has been admitted to the progressive unit given her stable sepsis and close monitoring needed for progressive orbital cellulitis facial cellulitis and/or monitor for any airway involvement  Author: Prentice JAYSON Lowenstein, MD 05/31/2024 4:45 PM  For on call review [email or web address].   5/Nov/2025 Response to Query re: HBA1c order during H+P, ordered to rule out  DM as per clinical suspicion ( causing immunocompromise and pre-disposition to her rather rapid and impressive progression of soft tissue infection).

## 2024-05-31 NOTE — Assessment & Plan Note (Signed)
   Fall this occurred due to going outside use a Nettie pot while dark Cannot elicit any focal neurology on examination, denies any increasing back pain: If blood cultures positive will need to explore the solid effusions as well as the fact she has hardware in her neck CT head and CT cervical spine unremarkable although of note the patient has indwelling spinal hardware blood culture positive Patient reports tha1. No acute abnormality of the cervical spine related to the provided clinical history of ataxia and cervical trauma. 2. Solid effusion at C3-4, C4-5, C5-6, and C6-7. 3. Chronic endplate changes at C7-T1. 4. Stable mild right foraminal narrowing at C2-3. CT Head 1. No acute intracranial abnormality. 2. Right supraorbital scalp hematoma without underlying fracture or foreign body.

## 2024-05-31 NOTE — ED Triage Notes (Signed)
 Pt to ED for dental abscess x3 days, [facility] form [facility]. Right facial swelling noted. Denies fevers.

## 2024-05-31 NOTE — Assessment & Plan Note (Signed)
 Sepsis secondary to dental related abscess Concern for periorbital cellulitis, CT: BRAIN, ORBITS AND SINUSES:No acute abnormality. Sepsis based on Sep 2 criteria based off of the tachycardia of 110 and WBC of 13.4 and known infection Lactic acid reassuring at 1.6 -> CT: Lucency about the right medialmaxillary sinus has eroded the anterior osseous margin and resulted in a subperiosteal abscess measuring 17 x 20 x 7 mm along the anterior rightparamedian maxilla. Of note she has free draining pus into her mouth degree of self source control Clinically, patient has right hemiface swollen with swelling around the right eye and painful eye movements but full eye range of motion Discussed case at 2:47 PM with ENT on-call Dr. Jessee with imaging findings as well as the clinical presentation and will review for further source control agrees with the Unasyn  monotherapy for now Clinical picture not consistent with necrotizing infection Plan: IV Unasyn  3 g every 6 hours given source is dental in nature (no suspicion disease fungal currently), A1c, ENT consultation for review ?  Further source control need (lucency about the right medialmaxillary sinus).  Follow-up outstanding blood cultures which have been ordered, adding MRSA coverage with vancomycin  until culture directed data has been obtained given the rapid progression of facial cellulitis, MRSA nares ordered, of note this presentation is not appropriate for the cellulitis order set.  Serial examination for progressive orbital or airway involvement .

## 2024-05-31 NOTE — Assessment & Plan Note (Signed)
 Forehead hematoma Hb 13 Does not require any other intervention other than conservative measures CT head without any intracranial bleed

## 2024-05-31 NOTE — Assessment & Plan Note (Signed)
 Chronic kidney disease Stage IIIa Creatinine 1.1 with an estimated GFR 59 consistent with 1 year ago We will avoid nephrotoxins keep volume replete

## 2024-05-31 NOTE — Progress Notes (Signed)
 CODE SEPSIS - PHARMACY COMMUNICATION  **Broad Spectrum Antibiotics should be administered within 1 hour of Sepsis diagnosis**  Time Code Sepsis Called/Page Received: 1239  Antibiotics Ordered: Unasyn  3g  Time of 1st antibiotic administration: 1207  Additional action taken by pharmacy: N/A  If necessary, Name of Provider/Nurse Contacted: N/A   Mercedes Forbes, PharmD, BCPS Clinical Pharmacist 05/31/2024 12:40 PM

## 2024-05-31 NOTE — Consult Note (Addendum)
 Subjective:    Mercedes Forbes is a 53 y.o. female who I am asked to see in consultation for evaluation of a right facial abscess. Patient presents with a history of significant dental disease in the midst of extractions.  She noticed facial swelling earlier today and presented to the ED.  She underwent imaging and was started on IV ABRx.   She reports spontaneous drainage in the mouth while she was in the ED>    Review of Systems Pertinent items are noted in HPI.    Objective:    BP 125/78 (BP Location: Left Arm)   Pulse 77   Temp 98.4 F (36.9 C)   Resp 18   Ht 5' 7 (1.702 m)   Wt 71.2 kg   LMP 06/19/2018   SpO2 97%   BMI 24.59 kg/m   General:   healthy, alert  Head and Face:   Right midface edema and erythema extending to the lower eyelid  External Ears:   normal pinnae shape and position  Ext. Aud. Canal:  Right:patent   Left: patent   Tympanic Mem:  Right: normal landmarks and mobility  Left: normal landmarks and mobility  Nose:  Nares normal. Septum midline. Mucosa normal. No drainage or sinus tenderness., no discharge  Oropharynx:   Swelling of the right upper vestibule of mouth and gingiva overlying the right canine and lateral incisor (needle aspiration performed with 21 gauge needle  - 2 cc pus removed and sent for culture), multiple caries, normal tongue movement, palate mobile  Tonsils:   normal size, normal appearance  Post. Pharynx:   normal mucosa  Neck:   no asymmetry, masses, or scars  Thyroid :   Normal    Medical Decision Making:  I reviewed the CT neck with contrast from 05/31/2024 and there is diffuse dental disease and a subperiosteal abscess on the right premaxilla   Assessment:    53 y.o. female with diffuse dental caries and subperiosteal abscess s/p I&D.  Agree with IV Abrx treatment.  Culture taken and can be sent for aerobic and anaerobic.  SABRA       Plan:    1. I recommend patient see her local dentist for continued extractions as an  outpatient   2.  Advanced diet as tolerated 3. Continue IV Abrx overnight and discharge home tomorrow on oral antibiotics if she is stable .

## 2024-05-31 NOTE — Consult Note (Signed)
 Pharmacy Antibiotic Note  Mercedes Forbes is a 53 y.o. female admitted on 05/31/2024 with facial cellulitis/dental abscess.  Pharmacy has been consulted for vancomycin  dosing.  Plan: Give vancomycin  1500 mg IV x 1, then 1250 mg IV every 24 hours Estimated AUC 477.7, Cmin 10.9 IBW, Scr 1.1, Vd 0.72 Vancomycin  levels at steady state or as clinically indicated Unasyn  3 grams IV every 6 hours Follow renal function and cultures for adjustments  Height: 5' 7 (170.2 cm) Weight: 71.2 kg (157 lb) IBW/kg (Calculated) : 61.6  Temp (24hrs), Avg:97.5 F (36.4 C), Min:97.5 F (36.4 C), Max:97.5 F (36.4 C)  Recent Labs  Lab 05/31/24 1137 05/31/24 1157  WBC 13.4*  --   CREATININE 1.10*  --   LATICACIDVEN  --  1.6    Estimated Creatinine Clearance: 56.2 mL/min (A) (by C-G formula based on SCr of 1.1 mg/dL (H)).    Allergies  Allergen Reactions   Aspirin Other (See Comments) and Shortness Of Breath    Shortness of breath like an asthma attack   Flurbiprofen Shortness Of Breath   Nsaids Shortness Of Breath   Tramadol  Itching    Itching all over the body    Antimicrobials this admission: Unasyn  9/14 >>  vancomycin  9/14 >>   Microbiology results: 9/14 BCx: pending 9/14 MRSA PCR: ordered  Thank you for allowing pharmacy to be a part of this patient's care.  Kayla JULIANNA Blew, PharmD, BCPS 05/31/2024 4:19 PM

## 2024-06-01 DIAGNOSIS — K047 Periapical abscess without sinus: Principal | ICD-10-CM

## 2024-06-01 DIAGNOSIS — L0291 Cutaneous abscess, unspecified: Secondary | ICD-10-CM | POA: Diagnosis not present

## 2024-06-01 DIAGNOSIS — L03211 Cellulitis of face: Secondary | ICD-10-CM

## 2024-06-01 LAB — CBC
HCT: 31.5 % — ABNORMAL LOW (ref 36.0–46.0)
Hemoglobin: 10 g/dL — ABNORMAL LOW (ref 12.0–15.0)
MCH: 29.8 pg (ref 26.0–34.0)
MCHC: 31.7 g/dL (ref 30.0–36.0)
MCV: 93.8 fL (ref 80.0–100.0)
Platelets: 227 K/uL (ref 150–400)
RBC: 3.36 MIL/uL — ABNORMAL LOW (ref 3.87–5.11)
RDW: 14.7 % (ref 11.5–15.5)
WBC: 6.9 K/uL (ref 4.0–10.5)
nRBC: 0 % (ref 0.0–0.2)

## 2024-06-01 LAB — COMPREHENSIVE METABOLIC PANEL WITH GFR
ALT: 16 U/L (ref 0–44)
AST: 20 U/L (ref 15–41)
Albumin: 3.4 g/dL — ABNORMAL LOW (ref 3.5–5.0)
Alkaline Phosphatase: 87 U/L (ref 38–126)
Anion gap: 8 (ref 5–15)
BUN: 12 mg/dL (ref 6–20)
CO2: 26 mmol/L (ref 22–32)
Calcium: 8.5 mg/dL — ABNORMAL LOW (ref 8.9–10.3)
Chloride: 105 mmol/L (ref 98–111)
Creatinine, Ser: 0.82 mg/dL (ref 0.44–1.00)
GFR, Estimated: >60 mL/min (ref >60)
Glucose, Bld: 80 mg/dL (ref 70–99)
Potassium: 3.5 mmol/L (ref 3.5–5.1)
Sodium: 139 mmol/L (ref 135–145)
Total Bilirubin: 0.7 mg/dL (ref 0.0–1.2)
Total Protein: 6.8 g/dL (ref 6.5–8.1)

## 2024-06-01 LAB — T4, FREE: Free T4: 1.01 ng/dL (ref 0.61–1.12)

## 2024-06-01 LAB — HIV ANTIBODY (ROUTINE TESTING W REFLEX): HIV Screen 4th Generation wRfx: NONREACTIVE

## 2024-06-01 MED ORDER — AMOXICILLIN-POT CLAVULANATE 875-125 MG PO TABS
1.0000 | ORAL_TABLET | Freq: Two times a day (BID) | ORAL | 0 refills | Status: AC
Start: 2024-06-01 — End: 2024-06-10

## 2024-06-01 MED ORDER — ACETAMINOPHEN 325 MG PO TABS: 650.0000 mg | ORAL_TABLET | Freq: Four times a day (QID) | ORAL | 0 refills | Status: AC | PRN

## 2024-06-01 MED ORDER — OXYCODONE HCL 5 MG PO TABS
5.0000 mg | ORAL_TABLET | ORAL | Status: DC | PRN
  Administered 2024-06-01: 5 mg via ORAL
  Filled 2024-06-01: qty 1

## 2024-06-01 NOTE — Progress Notes (Incomplete)
 PROGRESS NOTE  Mercedes Forbes    DOB: Jul 18, 1971, 53 y.o.  FMW:[id number]    Code Status: Full Code   DOA: 05/31/2024   LOS: 1   Brief hospital course  Mercedes Forbes is a 53 y.o. female with a PMH significant for Bipolar, schizophrenia, hypothyroidism, cocaine abuse, cervical radiculopathy with s/p ACDF as well as poor dentition presented to urgent care earlier today with painful eye movements on the right side increasing facial swelling on her right face as well as purulent drainage from her upper mouth.  The patient reports the pain started approximately 4 days ago.  The patient reports discharge from her sinuses and was using a Nettie pot at home and walked outside in the melanite to empty it out and then had a fall to her head and face as well primarily to her forehead and reports that she did not have trauma around her right eyebrow her current swelling is.  She denies any visual disturbances.   Past medical records reviewed and summarized: [facility] with facial swelling and dental related abscess today Office visit May 17, 2023: Bipolar, schizophrenia, hypothyroidism, cocaine abuse, cervical radiculopathy with s/p ACDF C3-C7 by Dr. Clois on 08/06/18..  They presented from *** to the ED on 05/31/2024 with *** x *** days. ***  In the ED, it was found that they had ***.  Significant findings included ***.  They were initially treated with ***.   Patient was admitted to medicine service for further workup and management of *** as outlined in detail below.  06/01/24 -***  Assessment & Plan  Principal Problem:   Dental abscess Active Problems:   Hypothyroidism   Acute pain   Tachycardia   Fall at home, initial encounter   Mood disorder (HCC)   CKD stage 3a, GFR 45-59 ml/min (HCC)   Traumatic hematoma of forehead  *** -   *** -   *** -   *** -   *** -   Body mass index is 24.59 kg/m.  VTE ppx: enoxaparin  (LOVENOX ) injection 40 mg  Start: 05/31/24 2200 SCDs Start: 05/31/24 1451   Diet:     Diet   Diet clear liquid Room service appropriate? Yes; Fluid consistency: Thin   Consultants: ***  Subjective 06/01/24    Pt reports ***   Objective  Blood pressure 117/85, pulse 70, temperature 98.1 F (36.7 C), resp. rate 18, height 5' 7 (1.702 m), weight 71.2 kg, last menstrual period 06/19/2018, SpO2 97%.  Intake/Output Summary (Last 24 hours) at 06/01/2024 0740 Last data filed at 05/31/2024 2357 Gross per 24 hour  Intake 1966.01 ml  Output --  Net 1966.01 ml   Filed Weights   05/31/24 1134  Weight: 71.2 kg     Physical Exam: *** General: awake, alert, NAD HEENT: atraumatic, clear conjunctiva, anicteric sclera, MMM, hearing grossly normal Respiratory: normal respiratory effort. Cardiovascular: quick capillary refill, normal S1/S2, RRR, no JVD, murmurs Gastrointestinal: soft, NT, ND Nervous: A&O x3. no gross focal neurologic deficits, normal speech Extremities: moves all equally, no edema, normal tone Skin: dry, intact, normal temperature, normal color. No rashes, lesions or ulcers on exposed skin Psychiatry: normal mood, congruent affect  Labs   I have personally reviewed the following labs and imaging studies CBC    Component Value Date/Time   WBC 6.9 06/01/2024 0440   RBC 3.36 (L) 06/01/2024 0440   HGB 10.0 (L) 06/01/2024 0440   HGB 13.8 03/07/2014 0159   HCT 31.5 (  L) 06/01/2024 0440   HCT 41.6 03/07/2014 0159   PLT 227 06/01/2024 0440   PLT 270 03/07/2014 0159   MCV 93.8 06/01/2024 0440   MCV 94 03/07/2014 0159   MCH 29.8 06/01/2024 0440   MCHC 31.7 06/01/2024 0440   RDW 14.7 06/01/2024 0440   RDW 14.1 03/07/2014 0159   LYMPHSABS 3.9 07/01/2022 2100   LYMPHSABS 3.5 03/07/2014 0159   MONOABS 0.4 07/01/2022 2100   MONOABS 0.5 03/07/2014 0159   EOSABS 0.4 07/01/2022 2100   EOSABS 0.2 03/07/2014 0159   BASOSABS 0.1 07/01/2022 2100   BASOSABS 0.1 03/07/2014 0159      Latest Ref Rng &  Units 06/01/2024    4:40 AM 05/31/2024   11:37 AM 02/25/2023    2:49 PM  BMP  Glucose 70 - 99 mg/dL 80  891  867   BUN 6 - 20 mg/dL 12  20  17    Creatinine 0.44 - 1.00 mg/dL 9.17  8.89  8.95   Sodium 135 - 145 mmol/L 139  135  141   Potassium 3.5 - 5.1 mmol/L 3.5  4.1  4.3   Chloride 98 - 111 mmol/L 105  99  104   CO2 22 - 32 mmol/L 26  22  25    Calcium 8.9 - 10.3 mg/dL 8.5  9.5  9.3     CT Maxillofacial W Contrast Result Date: 05/31/2024 EXAM: CT Face with contrast 05/31/2024 01:12:51 PM TECHNIQUE: CT of the face was performed with the administration of intravenous contrast. Multiplanar reformatted images are provided for review. Automated exposure control, iterative reconstruction, and/or weight based adjustment of the mA/kV was utilized to reduce the radiation dose to as low as reasonably achievable. COMPARISON: None available CLINICAL HISTORY: Facial trauma, blunt; swelling. Facial trauma to eye; dental abscess x3 days, [facility] form [facility]. Right facial swelling noted. Denies fevers FINDINGS: AERODIGESTIVE TRACT: No mass. No edema. SALIVARY GLANDS: No acute abnormality. LYMPH NODES: No suspicious cervical lymphadenopathy. SOFT TISSUES: Right supraorbital scalp hematoma is present without underlying fracture foreign body. Asymmetric right-sided facial soft tissue swelling may be related to trauma or a subperiosteal abscess along the right side of the anterior maxilla. BRAIN, ORBITS AND SINUSES: No acute abnormality. BONES: Prominent dental caries are present in the residual medial and lateral incisors as well as the canine tooth bilaterally. Lucency about the right medial maxillary sinus has eroded the anterior osseous margin and resulted in a subperiosteal abscess measuring 17 x 20 x 7 mm along the anterior right paramedian maxilla. Multiple other periapical lucencies are present without associated soft tissue abnormalities. No focal soft tissue abnormalities present along the mandible. IMPRESSION:  1. Right supraorbital scalp hematoma without underlying fracture or foreign body. 2. Asymmetric right-sided facial soft tissue swelling, possibly related to trauma or a subperiosteal abscess along the right side of the anterior maxilla. 3. Prominent dental caries with associated subperiosteal abscess along the anterior right paramedian maxilla. Multiple other periapical lucencies are present without associated soft tissue abnormalities. Electronically signed by: Lonni Necessary MD 05/31/2024 01:44 PM EDT RP Workstation: [id number]   CT Cervical Spine Wo Contrast Result Date: 05/31/2024 EXAM: CT CERVICAL SPINE WITHOUT CONTRAST 05/31/2024 01:12:51 PM TECHNIQUE: CT of the cervical spine was performed without the administration of intravenous contrast. Multiplanar reformatted images are provided for review. Automated exposure control, iterative reconstruction, and/or weight based adjustment of the mA/kV was utilized to reduce the radiation dose to as low as reasonably achievable. COMPARISON: CT of the cervical spine 10/10/2022.  CLINICAL HISTORY: Ataxia, cervical trauma. Facial trauma to eye. FINDINGS: CERVICAL SPINE: BONES AND ALIGNMENT: No acute fracture or traumatic malalignment. DEGENERATIVE CHANGES: Chronic endplate changes are present at C7-T1. Mild right foraminal narrowing at C2-3 is stable. SOFT TISSUES: No prevertebral soft tissue swelling. Solid effusion is present at C3-4, C4-5, C5-6 and C6-7. IMPRESSION: 1. No acute abnormality of the cervical spine related to the provided clinical history of ataxia and cervical trauma. 2. Solid effusion at C3-4, C4-5, C5-6, and C6-7. 3. Chronic endplate changes at C7-T1. 4. Stable mild right foraminal narrowing at C2-3. Electronically signed by: Lonni Necessary MD 05/31/2024 01:39 PM EDT RP Workstation: [id number]   CT HEAD WO CONTRAST ( ) Result Date: 05/31/2024 EXAM: CT HEAD WITHOUT CONTRAST 05/31/2024 01:12:51 PM TECHNIQUE: CT of the head was performed  without the administration of intravenous contrast. Automated exposure control, iterative reconstruction, and/or weight based adjustment of the mA/kV was utilized to reduce the radiation dose to as low as reasonably achievable. COMPARISON: CT head without contrast 07/01/2022. CLINICAL HISTORY: Head trauma, abnormal mental status (Age 35-64y). Facial trauma to eye. FINDINGS: BRAIN AND VENTRICLES: No acute hemorrhage. No evidence of acute infarct. No hydrocephalus. No extra-axial collection. No mass effect or midline shift. ORBITS: No acute abnormality. SINUSES: No acute abnormality. SOFT TISSUES AND SKULL: Right supraorbital scalp hematoma is present without underlying fracture or foreign body. IMPRESSION: 1. No acute intracranial abnormality. 2. Right supraorbital scalp hematoma without underlying fracture or foreign body. Electronically signed by: Lonni Necessary MD 05/31/2024 01:38 PM EDT RP Workstation: [id number]    Disposition Plan & Communication  Patient status: Inpatient  Admitted From: {From:23814} Planned disposition location: {PLAN; DISPOSITION:26386} Anticipated discharge date: *** pending ***  Family Communication: ***    Author: Marien LITTIE Piety, DO [facility] 06/01/2024, 7:40 AM   Available by Epic secure chat 7AM-7PM. If 7PM-7AM, please contact night-coverage.  [facility] contact information found on [email or web address].

## 2024-06-01 NOTE — Discharge Summary (Signed)
 Physician Discharge Summary  Patient: Jonquil Stubbe FMW:[id number] DOB: 30-Jul-1971   Code Status: Full Code Admit date: 05/31/2024 Discharge date: 06/01/2024 Disposition: Home, No home health services recommended PCP: Buren Rock HERO, MD  Recommendations for Outpatient Follow-up:  Follow up with PCP within 1-2 weeks Regarding general hospital follow up and preventative care Recommend CBC, BMP.  Follow up on thyroid  labs and consider treatment, further workup Follow up with dentistry as early as possible  Discharge Diagnoses:  Principal Problem:   Dental abscess Active Problems:   Hypothyroidism   Acute pain   Tachycardia   Fall at home, initial encounter   Mood disorder (HCC)   CKD stage 3a, GFR 45-59 ml/min (HCC)   Traumatic hematoma of forehead   Cellulitis of face   Abscess  Brief Hospital Course Summary: Abrish Erny is a 53 y.o. female with a PMH significant for Bipolar, schizophrenia, hypothyroidism, cocaine abuse, cervical radiculopathy with s/p ACDF as well as poor dentition presented to urgent care earlier today with painful eye movements on the right side increasing facial swelling on her right face as well as purulent drainage from her upper mouth. She was directed to report to ED.   She was admitted for dental abscess and concern for periorbital cellulitis as seen on CT scan of maxilofacial, head, and cervical spine.  CT: Lucency about the right medialmaxillary sinus has eroded the anterior osseous margin and resulted in a subperiosteal abscess measuring 17 x 20 x 7 mm along the anterior rightparamedian maxilla. Of note she has free draining pus into her mouth. Patient was reviewed with on-call Dr. Jessee, ENT, who did not recommend operative intervention due to not having an approachable collection to drain. She was started on unasyn .  She remained afebrile and leukocytosis resolved with treatment. Given the spontaneous drainage of the pus in her mouth, she  had remarkable improvement in her symptoms. Swelling of her face improved as well and her vision and eye movement remained unaffected by the infection.  She was discharged on oral antibiotics as listed below.  She already had follow up dental appointment scheduled for the following week.   Of note, her TSH was significantly elevated on presentation. Her T3 and T4 were drawn and not resulted at time of dc. Informed her to follow up with her PCP after dc.   All other chronic conditions were treated with home medications.    Discharge Condition: Good, improved Recommended discharge diet: Regular healthy diet  Consultations: ENT  Procedures/Studies: None   Allergies as of 06/01/2024       Reactions   Aspirin Other (See Comments), Shortness Of Breath   Shortness of breath like an asthma attack   Flurbiprofen Shortness Of Breath   Nsaids Shortness Of Breath   Tramadol  Itching   Itching all over the body        Medication List     TAKE these medications    acetaminophen  325 MG tablet Commonly known as: TYLENOL  Take 2 tablets (650 mg total) by mouth every 6 (six) hours as needed for up to 15 days for mild pain (pain score 1-3) or moderate pain (pain score 4-6).   amoxicillin -clavulanate 875-125 MG tablet Commonly known as: AUGMENTIN  Take 1 tablet by mouth 2 (two) times daily for 9 days.   busPIRone  10 MG tablet Commonly known as: BUSPAR  Take 10 mg by mouth 2 (two) times daily.   DULoxetine  60 MG capsule Commonly known as: CYMBALTA  Take 60 mg by mouth  daily.   gabapentin  600 MG tablet Commonly known as: Neurontin  Take 1 tablet (600 mg total) by mouth 3 (three) times daily.        Follow-up Information     Buren Rock HERO, MD. Schedule an appointment as soon as possible for a visit in 1 week(s).   Specialty: Family Medicine Contact information: [address] [place] [address] [phone]                 Subjective   Pt reports feeling well.  Denies painful or inhibited eye movements. Her mouth pain has improved. Has dental follow up scheduled next week.   All questions and concerns were addressed at time of discharge.  Objective  Blood pressure 117/85, pulse 70, temperature 98.1 F (36.7 C), resp. rate 18, height 5' 7 (1.702 m), weight 71.2 kg, last menstrual period 06/19/2018, SpO2 97%.   General: Pt is alert, awake, not in acute distress HEENT: peridental disease. No active drainage seen. R eyelid mild erythema and mild edema. EOEM intact. Visual acuity grossly intact. No ocular drainage.  Cardiovascular: RRR, S1/S2 +, no rubs, no gallops Respiratory: CTA bilaterally, no wheezing, no rhonchi Abdominal: Soft, NT, ND, bowel sounds + Extremities: no edema, no cyanosis  The results of significant diagnostics from this hospitalization (including imaging, microbiology, ancillary and laboratory) are listed below for reference.   Imaging studies: CT Maxillofacial W Contrast Result Date: 05/31/2024 EXAM: CT Face with contrast 05/31/2024 01:12:51 PM TECHNIQUE: CT of the face was performed with the administration of intravenous contrast. Multiplanar reformatted images are provided for review. Automated exposure control, iterative reconstruction, and/or weight based adjustment of the mA/kV was utilized to reduce the radiation dose to as low as reasonably achievable. COMPARISON: None available CLINICAL HISTORY: Facial trauma, blunt; swelling. Facial trauma to eye; dental abscess x3 days, sent form [facility]. Right facial swelling noted. Denies fevers FINDINGS: AERODIGESTIVE TRACT: No mass. No edema. SALIVARY GLANDS: No acute abnormality. LYMPH NODES: No suspicious cervical lymphadenopathy. SOFT TISSUES: Right supraorbital scalp hematoma is present without underlying fracture foreign body. Asymmetric right-sided facial soft tissue swelling may be related to trauma or a subperiosteal abscess along the right side of the anterior maxilla. BRAIN,  ORBITS AND SINUSES: No acute abnormality. BONES: Prominent dental caries are present in the residual medial and lateral incisors as well as the canine tooth bilaterally. Lucency about the right medial maxillary sinus has eroded the anterior osseous margin and resulted in a subperiosteal abscess measuring 17 x 20 x 7 mm along the anterior right paramedian maxilla. Multiple other periapical lucencies are present without associated soft tissue abnormalities. No focal soft tissue abnormalities present along the mandible. IMPRESSION: 1. Right supraorbital scalp hematoma without underlying fracture or foreign body. 2. Asymmetric right-sided facial soft tissue swelling, possibly related to trauma or a subperiosteal abscess along the right side of the anterior maxilla. 3. Prominent dental caries with associated subperiosteal abscess along the anterior right paramedian maxilla. Multiple other periapical lucencies are present without associated soft tissue abnormalities. Electronically signed by: Lonni Necessary MD 05/31/2024 01:44 PM EDT RP Workstation: [id number]   CT Cervical Spine Wo Contrast Result Date: 05/31/2024 EXAM: CT CERVICAL SPINE WITHOUT CONTRAST 05/31/2024 01:12:51 PM TECHNIQUE: CT of the cervical spine was performed without the administration of intravenous contrast. Multiplanar reformatted images are provided for review. Automated exposure control, iterative reconstruction, and/or weight based adjustment of the mA/kV was utilized to reduce the radiation dose to as low as reasonably achievable. COMPARISON: CT of  the cervical spine 10/10/2022. CLINICAL HISTORY: Ataxia, cervical trauma. Facial trauma to eye. FINDINGS: CERVICAL SPINE: BONES AND ALIGNMENT: No acute fracture or traumatic malalignment. DEGENERATIVE CHANGES: Chronic endplate changes are present at C7-T1. Mild right foraminal narrowing at C2-3 is stable. SOFT TISSUES: No prevertebral soft tissue swelling. Solid effusion is present at C3-4,  C4-5, C5-6 and C6-7. IMPRESSION: 1. No acute abnormality of the cervical spine related to the provided clinical history of ataxia and cervical trauma. 2. Solid effusion at C3-4, C4-5, C5-6, and C6-7. 3. Chronic endplate changes at C7-T1. 4. Stable mild right foraminal narrowing at C2-3. Electronically signed by: Lonni Necessary MD 05/31/2024 01:39 PM EDT RP Workstation: [id number]   CT HEAD WO CONTRAST ( ) Result Date: 05/31/2024 EXAM: CT HEAD WITHOUT CONTRAST 05/31/2024 01:12:51 PM TECHNIQUE: CT of the head was performed without the administration of intravenous contrast. Automated exposure control, iterative reconstruction, and/or weight based adjustment of the mA/kV was utilized to reduce the radiation dose to as low as reasonably achievable. COMPARISON: CT head without contrast 07/01/2022. CLINICAL HISTORY: Head trauma, abnormal mental status (Age 43-64y). Facial trauma to eye. FINDINGS: BRAIN AND VENTRICLES: No acute hemorrhage. No evidence of acute infarct. No hydrocephalus. No extra-axial collection. No mass effect or midline shift. ORBITS: No acute abnormality. SINUSES: No acute abnormality. SOFT TISSUES AND SKULL: Right supraorbital scalp hematoma is present without underlying fracture or foreign body. IMPRESSION: 1. No acute intracranial abnormality. 2. Right supraorbital scalp hematoma without underlying fracture or foreign body. Electronically signed by: Lonni Necessary MD 05/31/2024 01:38 PM EDT RP Workstation: [id number]    Labs: Basic Metabolic Panel: Recent Labs  Lab 05/31/24 1137 06/01/24 0440  NA 135 139  K 4.1 3.5  CL 99 105  CO2 22 26  GLUCOSE 108* 80  BUN 20 12  CREATININE 1.10* 0.82  CALCIUM 9.5 8.5*   CBC: Recent Labs  Lab 05/31/24 1137 06/01/24 0440  WBC 13.4* 6.9  HGB 13.0 10.0*  HCT 39.4 31.5*  MCV 90.8 93.8  PLT 308 227   Microbiology: Results for orders placed or performed during the hospital encounter of 05/31/24  Blood culture (routine x 2)      Status: None (Preliminary result)   Collection Time: 05/31/24 11:57 AM   Specimen: BLOOD  Result Value Ref Range Status   Specimen Description BLOOD BLOOD RIGHT ARM  Final   Special Requests   Final    BOTTLES DRAWN AEROBIC AND ANAEROBIC Blood Culture adequate volume   Culture   Final    NO GROWTH < 24 HOURS Performed at [facility], [address] [place], [place] [address]    Report Status PENDING  Incomplete  Blood culture (routine x 2)     Status: None (Preliminary result)   Collection Time: 05/31/24 11:57 AM   Specimen: BLOOD  Result Value Ref Range Status   Specimen Description BLOOD BLOOD RIGHT ARM  Final   Special Requests   Final    BOTTLES DRAWN AEROBIC AND ANAEROBIC Blood Culture adequate volume   Culture   Final    NO GROWTH < 24 HOURS Performed at [facility], [address] [place], [place] [address]    Report Status PENDING  Incomplete  MRSA Next Gen by PCR, Nasal     Status: Abnormal   Collection Time: 05/31/24  4:50 PM   Specimen: Nasal Mucosa; Nasal Swab  Result Value Ref Range Status   MRSA by PCR Next Gen DETECTED (A) NOT DETECTED Final    Comment:  RESULT CALLED TO, READ BACK BY AND VERIFIED WITH: KARA CAMPBELL 05/31/24 2143 KLW (NOTE) The GeneXpert MRSA Assay (FDA approved for NASAL specimens only), is one component of a comprehensive MRSA colonization surveillance program. It is not intended to diagnose MRSA infection nor to guide or monitor treatment for MRSA infections. Test performance is not FDA approved in patients less than 75 years old. Performed at [facility], [address], [place], [place] [address]     Time coordinating discharge: Over 30 minutes  Marien LITTIE Piety, MD  [facility] 06/01/2024, 10:58 AM

## 2024-06-01 NOTE — TOC CM/SW Note (Signed)
 Transition of Care [facility]) - Inpatient Brief Assessment   Patient Details  Name: Mercedes Forbes MRN: [id number] Date of Birth: 09-07-1971  Transition of Care [facility]) CM/SW Contact:    Lauraine JAYSON Carpen, LCSW Phone Number: 06/01/2024, 11:41 AM   Clinical Narrative: Patient has orders to discharge home today. Chart reviewed. No TOC needs identified. CSW signing off.  Transition of Care Asessment: Insurance and Status: Insurance coverage has been reviewed Patient has primary care physician: Yes Home environment has been reviewed: Single family home Prior level of function:: Not documented Prior/Current Home Services: No current home services Social Drivers of Health Review: SDOH reviewed no interventions necessary Readmission risk has been reviewed: Yes Transition of care needs: no transition of care needs at this time

## 2024-06-01 NOTE — Care Management CC44 (Signed)
 Condition Code 44 Documentation Completed  Patient Details  Name: Mercedes Forbes MRN: [id number] Date of Birth: Feb 08, 1971   Condition Code 44 given:  Yes Patient signature on Condition Code 44 notice:  Yes Documentation of 2 MD's agreement:  Yes Code 44 added to claim:  Yes    Lauraine JAYSON Carpen, LCSW 06/01/2024, 11:39 AM

## 2024-06-01 NOTE — Discharge Instructions (Signed)
 Please continue your antibiotics starting today and until they are completed.  Follow up with your dentist as early as you can get an appointment to address your tooth abscess.  I have put in an order for your thyroid  hormones which will likely result after your discharge. Please follow up with your primary doctor in 1-2 weeks to discuss these results and if you need to start medications or get an ultrasound of your thyroid .

## 2024-06-01 NOTE — Care Management Obs Status (Signed)
 MEDICARE OBSERVATION STATUS NOTIFICATION   Patient Details  Name: Mercedes Forbes MRN: [id number] Date of Birth: Oct 30, 1970   Medicare Observation Status Notification Given:  Yes    Lauraine JAYSON Carpen, LCSW 06/01/2024, 11:39 AM

## 2024-06-01 NOTE — Progress Notes (Signed)
 Pt reports right eye pain and pressure this morning. PRN's given for above. See MAR.

## 2024-06-02 LAB — T3: T3, Total: 151 ng/dL (ref 71–180)

## 2024-06-05 LAB — CULTURE, BLOOD (ROUTINE X 2)
Culture: NO GROWTH
Culture: NO GROWTH
Special Requests: ADEQUATE
Special Requests: ADEQUATE

## 2024-06-09 ENCOUNTER — Emergency Department
Admission: EM | Admit: 2024-06-09 | Discharge: 2024-06-09 | Disposition: A | Discharge status: Home / Self Care | Attending: Emergency Medicine | Admitting: Emergency Medicine

## 2024-06-09 DIAGNOSIS — R220 Localized swelling, mass and lump, head: Secondary | ICD-10-CM | POA: Diagnosis present

## 2024-06-09 DIAGNOSIS — K047 Periapical abscess without sinus: Secondary | ICD-10-CM | POA: Insufficient documentation

## 2024-06-09 LAB — COMPREHENSIVE METABOLIC PANEL WITH GFR
ALT: 13 U/L (ref 0–44)
AST: 22 U/L (ref 15–41)
Albumin: 4.2 g/dL (ref 3.5–5.0)
Alkaline Phosphatase: 88 U/L (ref 38–126)
Anion gap: 10 (ref 5–15)
BUN: 40 mg/dL — ABNORMAL HIGH (ref 6–20)
CO2: 24 mmol/L (ref 22–32)
Calcium: 9.4 mg/dL (ref 8.9–10.3)
Chloride: 101 mmol/L (ref 98–111)
Creatinine, Ser: 1.04 mg/dL — ABNORMAL HIGH (ref 0.44–1.00)
GFR, Estimated: >60 mL/min (ref >60)
Glucose, Bld: 95 mg/dL (ref 70–99)
Potassium: 4 mmol/L (ref 3.5–5.1)
Sodium: 135 mmol/L (ref 135–145)
Total Bilirubin: 0.4 mg/dL (ref 0.0–1.2)
Total Protein: 7.9 g/dL (ref 6.5–8.1)

## 2024-06-09 LAB — CBC WITH DIFFERENTIAL/PLATELET
Abs Immature Granulocytes: 0.03 K/uL (ref 0.00–0.07)
Basophils Absolute: 0.1 K/uL (ref 0.0–0.1)
Basophils Relative: 1 %
Eosinophils Absolute: 0.2 K/uL (ref 0.0–0.5)
Eosinophils Relative: 2 %
HCT: 34.2 % — ABNORMAL LOW (ref 36.0–46.0)
Hemoglobin: 11.5 g/dL — ABNORMAL LOW (ref 12.0–15.0)
Immature Granulocytes: 0 %
Lymphocytes Relative: 41 %
Lymphs Abs: 4 K/uL (ref 0.7–4.0)
MCH: 30.5 pg (ref 26.0–34.0)
MCHC: 33.6 g/dL (ref 30.0–36.0)
MCV: 90.7 fL (ref 80.0–100.0)
Monocytes Absolute: 0.6 K/uL (ref 0.1–1.0)
Monocytes Relative: 6 %
Neutro Abs: 4.8 K/uL (ref 1.7–7.7)
Neutrophils Relative %: 50 %
Platelets: 398 K/uL (ref 150–400)
RBC: 3.77 MIL/uL — ABNORMAL LOW (ref 3.87–5.11)
RDW: 14.5 % (ref 11.5–15.5)
Smear Review: NORMAL
WBC: 9.7 K/uL (ref 4.0–10.5)
nRBC: 0 % (ref 0.0–0.2)

## 2024-06-09 LAB — LACTIC ACID, PLASMA: Lactic Acid, Venous: 1 mmol/L (ref 0.5–1.9)

## 2024-06-09 MED ORDER — CLINDAMYCIN HCL 150 MG PO CAPS
300.0000 mg | ORAL_CAPSULE | Freq: Once | ORAL | Status: AC
  Administered 2024-06-09: 300 mg via ORAL
  Filled 2024-06-09: qty 2

## 2024-06-09 MED ORDER — CLINDAMYCIN HCL 300 MG PO CAPS: 300.0000 mg | ORAL_CAPSULE | Freq: Three times a day (TID) | ORAL | 0 refills | Status: AC

## 2024-06-09 MED ORDER — HYDROCODONE-ACETAMINOPHEN 5-325 MG PO TABS
1.0000 | ORAL_TABLET | Freq: Once | ORAL | Status: AC
  Administered 2024-06-09: 1 via ORAL
  Filled 2024-06-09: qty 1

## 2024-06-09 NOTE — Discharge Instructions (Addendum)
 As we discussed please follow-up with a dentist as soon as possible.  Please take your clindamycin  as prescribed.  Return to the emergency department for any increase in swelling, redness, fever or any symptom concerning to yourself.

## 2024-06-09 NOTE — ED Triage Notes (Signed)
 Pt to ED via POV from home. Pt reports right facial pain from infected tooth. Pt was seen here on 9/14 and given antibiotics. Pt reports last dose of meds tomorrow. Pt states yesterday face is more swollen and more painful.

## 2024-06-09 NOTE — ED Notes (Signed)
 Airway intact. No angioedema

## 2024-06-09 NOTE — ED Provider Notes (Signed)
 [facility] Provider Note    Event Date/Time   First MD Initiated Contact with Patient 06/09/24 2014     (approximate)  History   Chief Complaint: Facial Swelling  HPI  Mercedes Forbes is a 53 y.o. female with a past medical history of anxiety, bipolar, schizophrenia, presents to the emergency department for right sided facial pain.  According to the patient she was seen in the emergency department 9/13.  Per record review patient had a CT scan at that time showing possible mild right sided facial swelling.  Patient showed me a picture on her phone she had facial swelling at that time with erythema around her right mid face.  Patient has very poor dentition and was told it was likely due to a dental infection.  Patient was placed on Augmentin  twice daily ultimately discharged home.  Since going home she states the swelling has come down and the redness is almost resolved however over the last 24 hours she has begun to feel some discomfort in that area otherwise not experienced any significant swelling or redness.  No fever.  Patient states she did not want the infection to come back so she came to the emergency department.  Physical Exam   Triage Vital Signs: ED Triage Vitals [06/09/24 1850]  Encounter Vitals Group     BP (!) 137/90     Girls Systolic BP Percentile      Girls Diastolic BP Percentile      Boys Systolic BP Percentile      Boys Diastolic BP Percentile      Pulse Rate 88     Resp 18     Temp 98 F (36.7 C)     Temp Source Oral     SpO2 98 %     Weight      Height      Head Circumference      Peak Flow      Pain Score 8     Pain Loc      Pain Education      Exclude from Growth Chart     Most recent vital signs: Vitals:   06/09/24 1850  BP: (!) 137/90  Pulse: 88  Resp: 18  Temp: 98 F (36.7 C)  SpO2: 98%    General: Awake, no distress.  No appreciable facial swelling noted.  Very minimal erythema just lateral to the right  nostril.  No signs of abscess.  Poor dentition throughout but no obvious abscess intraorally. CV:  Good peripheral perfusion.  Resp:  Normal effort.   Abd:  No distention.     ED Results / Procedures / Treatments   MEDICATIONS ORDERED IN ED: Medications  HYDROcodone -acetaminophen  (NORCO/VICODIN) 5-325 MG per tablet 1 tablet (has no administration in time range)  clindamycin  (CLEOCIN ) capsule 300 mg (has no administration in time range)     IMPRESSION / MDM / ASSESSMENT AND PLAN / ED COURSE  I reviewed the triage vital signs and the nursing notes.  Patient's presentation is most consistent with acute presentation with potential threat to life or bodily function.  Patient presents to the emergency department for concerns for right sided facial infection.  I reviewed the patient CT scan from a week ago showing possible swelling but no obvious or drainable abscess.  Patient's lab work today is reassuring normal CBC with a reassuring white blood cell count normal lactic acid of 1.0 and a reassuring chemistry.  Given the patient's complaint of worsening  pain to the area however as she has just completed Augmentin  we will place the patient on clindamycin  3 times daily for 7 days.  Discussed with the patient the importance of following up with a dentist as soon as she is able.  Patient states she is already working with a dentist to have her teeth pulled.  We will dose a one-time dose of pain medication in the emergency department.  Will discharge patient with dental follow-up.  Patient agreeable to plan of care.  Provided return precautions.  FINAL CLINICAL IMPRESSION(S) / ED DIAGNOSES   Facial pain Dental infection  Note:  This document was prepared using Dragon voice recognition software and may include unintentional dictation errors.   Dorothyann Drivers, MD 06/09/24 2026

## 2024-08-22 ENCOUNTER — Emergency Department

## 2024-08-22 ENCOUNTER — Encounter: Payer: Self-pay | Admitting: Emergency Medicine

## 2024-08-22 ENCOUNTER — Other Ambulatory Visit: Payer: Self-pay

## 2024-08-22 ENCOUNTER — Emergency Department
Admission: EM | Admit: 2024-08-22 | Discharge: 2024-08-22 | Disposition: A | Discharge status: Home / Self Care | Attending: Emergency Medicine | Admitting: Emergency Medicine

## 2024-08-22 DIAGNOSIS — E039 Hypothyroidism, unspecified: Secondary | ICD-10-CM | POA: Insufficient documentation

## 2024-08-22 DIAGNOSIS — N1831 Chronic kidney disease, stage 3a: Secondary | ICD-10-CM | POA: Insufficient documentation

## 2024-08-22 DIAGNOSIS — W228XXA Striking against or struck by other objects, initial encounter: Secondary | ICD-10-CM | POA: Insufficient documentation

## 2024-08-22 DIAGNOSIS — S300XXA Contusion of lower back and pelvis, initial encounter: Secondary | ICD-10-CM | POA: Insufficient documentation

## 2024-08-22 MED ORDER — LIDOCAINE 5 % EX PTCH
1.0000 | MEDICATED_PATCH | CUTANEOUS | Status: DC
  Administered 2024-08-22: 1 via TRANSDERMAL
  Filled 2024-08-22: qty 1

## 2024-08-22 MED ORDER — ACETAMINOPHEN 325 MG PO TABS
650.0000 mg | ORAL_TABLET | Freq: Once | ORAL | Status: AC
  Administered 2024-08-22: 650 mg via ORAL
  Filled 2024-08-22: qty 2

## 2024-08-22 NOTE — Discharge Instructions (Addendum)
 You can sit on donut pillows to help relieve pressure directly on the area.  He may also take Tylenol /ibuprofen per package instructions to help with your symptoms.  Please return for any new, worsening, or changing symptoms or other concerns.  It was a pleasure caring for you today.

## 2024-08-22 NOTE — ED Notes (Signed)
 Patient declined discharge vital signs. Patient states she is calling her friend for a ride home. Patient ambulated from department with a slow, steady gait.

## 2024-08-22 NOTE — ED Triage Notes (Signed)
 Patient arrives ambulatory by POV states she slipped on a dog toy yesterday causing her right buttocks to hit the corner of an entertainment center. States it is painful to sit or walk.

## 2024-08-22 NOTE — ED Provider Notes (Signed)
 [facility] Provider Note    Event Date/Time   First MD Initiated Contact with Patient 08/22/24 1301     (approximate)   History   Fall   HPI  Mercedes Forbes is a 53 y.o. female who presents today for evaluation of buttock pain.  Patient reports that several days ago she hit her bottom against the corner of a table when she slipped on a dog toy.  She has been able to walk, though has pain with sitting.  No difficulty moving her bowels.  No numbness or tingling.  No abdominal pain.  Patient Active Problem List   Diagnosis Date Noted   Cellulitis of face 06/01/2024   Abscess 06/01/2024   Dental abscess 05/31/2024   Acute pain 05/31/2024   Tachycardia 05/31/2024   Fall at home, initial encounter 05/31/2024   Mood disorder 05/31/2024   CKD stage 3a, GFR 45-59 ml/min (HCC) 05/31/2024   Traumatic hematoma of forehead 05/31/2024   Cervical radiculopathy 08/06/2018   Chronic upper extremity pain (Secondary Area of Pain) (Bilateral) (R>L) 07/28/2018   DDD (degenerative disc disease), cervical 07/28/2018   Chronic neck pain (Primary Area of Pain) (Bilateral) (R>L) 07/15/2018   Chronic knee pain (Tertiary Area of Pain) (Right) 07/15/2018   Chronic low back pain ([name]) (Bilateral) (R>L) w/ sciatica (Bilateral) 07/15/2018   Chronic lower extremity pain (Bilateral) (R>L) 07/15/2018   Chronic hand pain (Bilateral) (L>R) 07/15/2018   Chronic pain syndrome 07/15/2018   Long term current use of opiate analgesic 07/15/2018   Pharmacologic therapy 07/15/2018   Disorder of skeletal system 07/15/2018   Problems influencing health status 07/15/2018   Lower extremity weakness 07/15/2018   Arm numbness 04/21/2018   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   Hypothyroidism 03/08/2017   Opiate abuse, episodic (HCC) 07/22/2016   PTSD (post-traumatic stress disorder) 04/04/2016   Tobacco use disorder 04/03/2016   Bipolar 2 disorder, major depressive episode  (HCC) 04/02/2016   Cocaine use disorder, moderate, dependence (HCC) 04/02/2016          Physical Exam   Triage Vital Signs: ED Triage Vitals [08/22/24 1237]  Encounter Vitals Group     BP 133/81     Girls Systolic BP Percentile      Girls Diastolic BP Percentile      Boys Systolic BP Percentile      Boys Diastolic BP Percentile      Pulse Rate 79     Resp 18     Temp 98.7 F (37.1 C)     Temp Source Oral     SpO2 95 %     Weight      Height      Head Circumference      Peak Flow      Pain Score 10     Pain Loc      Pain Education      Exclude from Growth Chart     Most recent vital signs: Vitals:   08/22/24 1237  BP: 133/81  Pulse: 79  Resp: 18  Temp: 98.7 F (37.1 C)  SpO2: 95%    Physical Exam Vitals and nursing note reviewed.  Constitutional:      General: Awake and alert. No acute distress.    Appearance: Normal appearance. The patient is normal weight.  HENT:     Head: Normocephalic and atraumatic.     Mouth: Mucous membranes are moist.  Eyes:     General: PERRL. Normal EOMs  Right eye: No discharge.        Left eye: No discharge.     Conjunctiva/sclera: Conjunctivae normal.  Cardiovascular:     Rate and Rhythm: Normal rate and regular rhythm.     Pulses: Normal pulses.  Pulmonary:     Effort: Pulmonary effort is normal. No respiratory distress.     Breath sounds: Normal breath sounds.  Abdominal:     Abdomen is soft. There is no abdominal tenderness. No rebound or guarding. No distention. 2 x 2 cm area of ecchymosis to her right buttock cheek.  No swelling or erythema.  No open wounds. Musculoskeletal:        General: No swelling. Normal range of motion.     Cervical back: Normal range of motion and neck supple.  Back: No midline tenderness. Strength and sensation 5/5 to bilateral lower extremities. Normal great toe extension against resistance. Normal sensation throughout feet. Normal patellar reflexes. Negative SLR and opposite SLR  bilaterally.  Skin:    General: Skin is warm and dry.     Capillary Refill: Capillary refill takes less than 2 seconds.     Findings: No rash.  Neurological:     Mental Status: The patient is awake and alert.      ED Results / Procedures / Treatments   Labs (all labs ordered are listed, but only abnormal results are displayed) Labs Reviewed - No data to display   EKG     RADIOLOGY I independently reviewed and interpreted imaging and agree with radiologists findings.     PROCEDURES:  Critical Care performed:   Procedures   MEDICATIONS ORDERED IN ED: Medications  lidocaine  (LIDODERM ) 5 % 1 patch (1 patch Transdermal Patch Applied 08/22/24 1400)  acetaminophen  (TYLENOL ) tablet 650 mg (650 mg Oral Given 08/22/24 1358)     IMPRESSION / MDM / ASSESSMENT AND PLAN / ED COURSE  I reviewed the triage vital signs and the nursing notes.   Differential diagnosis includes, but is not limited to, contusion, fracture, hematoma.  I reviewed the patient's chart.  Patient had a recent admission in September for a dental abscess.  Patient is able to ambulate with a steady and normal gait.  She is hemodynamically stable and afebrile.  She has a small area of ecchymosis to her right butt cheek which is where she struck the corner of the coffee table.  She has normal strength and sensation of bilateral lower extremities, no abdominal pain.  X-ray obtained and is negative for any acute findings.  Exam and history most consistent with contusion of buttock.  We discussed symptomatic management and return precautions.  Patient understands and agrees with plan.  She is ambulatory with a steady gait.  She was discharged in stable condition.   Patient's presentation is most consistent with acute complicated illness / injury requiring diagnostic workup.      FINAL CLINICAL IMPRESSION(S) / ED DIAGNOSES   Final diagnoses:  Contusion of buttock, initial encounter     Rx / DC Orders    ED Discharge Orders     None        Note:  This document was prepared using Dragon voice recognition software and may include unintentional dictation errors.   Kaziyah Parkison E, PA-C 08/22/24 1431    Willo Dunnings, MD 08/22/24 1517
